# Patient Record
Sex: Male | Born: 1945 | Race: White | Hispanic: No | Marital: Married | Smoking: Former smoker
Health system: Southern US, Community
[De-identification: ages and names within clinical notes are randomized; demographics above are authoritative.]

## PROBLEM LIST (undated history)

## (undated) DIAGNOSIS — E785 Hyperlipidemia, unspecified: Secondary | ICD-10-CM

## (undated) DIAGNOSIS — M1990 Unspecified osteoarthritis, unspecified site: Secondary | ICD-10-CM

## (undated) DIAGNOSIS — I10 Essential (primary) hypertension: Secondary | ICD-10-CM

## (undated) DIAGNOSIS — G629 Polyneuropathy, unspecified: Secondary | ICD-10-CM

## (undated) DIAGNOSIS — D473 Essential (hemorrhagic) thrombocythemia: Secondary | ICD-10-CM

## (undated) HISTORY — DX: Hyperlipidemia, unspecified: E78.5

## (undated) HISTORY — PX: JOINT REPLACEMENT: SHX530

## (undated) HISTORY — PX: TOE AMPUTATION: SHX809

## (undated) HISTORY — DX: Polyneuropathy, unspecified: G62.9

## (undated) HISTORY — PX: CARDIAC CATHETERIZATION: SHX172

## (undated) HISTORY — DX: Essential (hemorrhagic) thrombocythemia: D47.3

## (undated) HISTORY — PX: HERNIA REPAIR: SHX51

---

## 1998-06-27 ENCOUNTER — Ambulatory Visit (HOSPITAL_COMMUNITY): Admission: RE | Admit: 1998-06-27 | Discharge: 1998-06-27 | Payer: Self-pay | Admitting: Cardiology

## 1999-06-04 ENCOUNTER — Ambulatory Visit (HOSPITAL_COMMUNITY): Admission: RE | Admit: 1999-06-04 | Discharge: 1999-06-04 | Payer: Self-pay | Admitting: Gastroenterology

## 2001-08-22 ENCOUNTER — Encounter: Admission: RE | Admit: 2001-08-22 | Discharge: 2001-08-22 | Payer: Self-pay | Admitting: Internal Medicine

## 2001-08-22 ENCOUNTER — Encounter: Payer: Self-pay | Admitting: Internal Medicine

## 2004-04-21 ENCOUNTER — Encounter: Admission: RE | Admit: 2004-04-21 | Discharge: 2004-04-21 | Payer: Self-pay | Admitting: Internal Medicine

## 2010-02-24 ENCOUNTER — Emergency Department (HOSPITAL_COMMUNITY): Admission: EM | Admit: 2010-02-24 | Discharge: 2010-02-24 | Payer: Self-pay | Admitting: Emergency Medicine

## 2011-02-03 LAB — GRAM STAIN

## 2011-02-03 LAB — CSF CELL COUNT WITH DIFFERENTIAL
Eosinophils, CSF: 0 % (ref 0–1)
RBC Count, CSF: 1415 /mm3 — ABNORMAL HIGH
Tube #: 1
Tube #: 4

## 2011-02-03 LAB — CBC
HCT: 46 % (ref 39.0–52.0)
Hemoglobin: 16 g/dL (ref 13.0–17.0)
MCV: 96.9 fL (ref 78.0–100.0)
Platelets: 487 10*3/uL — ABNORMAL HIGH (ref 150–400)
RDW: 14.1 % (ref 11.5–15.5)

## 2011-02-03 LAB — BASIC METABOLIC PANEL
BUN: 23 mg/dL (ref 6–23)
CO2: 28 mEq/L (ref 19–32)
Calcium: 8.7 mg/dL (ref 8.4–10.5)
Chloride: 102 mEq/L (ref 96–112)
Creatinine, Ser: 0.87 mg/dL (ref 0.4–1.5)
GFR calc Af Amer: >60 mL/min (ref >60)
GFR calc non Af Amer: >60 mL/min (ref >60)
Glucose, Bld: 120 mg/dL — ABNORMAL HIGH (ref 70–99)
Potassium: 3.2 mEq/L — ABNORMAL LOW (ref 3.5–5.1)
Sodium: 136 mEq/L (ref 135–145)

## 2011-02-03 LAB — POCT CARDIAC MARKERS
Myoglobin, poc: 57.2 ng/mL (ref 12–200)
Troponin i, poc: <0.05 ng/mL (ref 0.00–0.09)

## 2011-02-03 LAB — CSF CULTURE W GRAM STAIN

## 2011-02-03 LAB — DIFFERENTIAL
Basophils Absolute: 0 10*3/uL (ref 0.0–0.1)
Eosinophils Absolute: 0.3 10*3/uL (ref 0.0–0.7)
Eosinophils Relative: 3 % (ref 0–5)
Monocytes Absolute: 1.5 10*3/uL — ABNORMAL HIGH (ref 0.1–1.0)

## 2011-02-03 LAB — PROTEIN AND GLUCOSE, CSF: Glucose, CSF: 67 mg/dL (ref 43–76)

## 2011-02-03 LAB — PROTIME-INR: Prothrombin Time: 13.6 seconds (ref 11.6–15.2)

## 2011-04-01 ENCOUNTER — Other Ambulatory Visit (HOSPITAL_COMMUNITY): Payer: Self-pay | Admitting: Specialist

## 2011-04-01 ENCOUNTER — Ambulatory Visit (HOSPITAL_COMMUNITY)
Admission: RE | Admit: 2011-04-01 | Discharge: 2011-04-01 | Disposition: A | Discharge status: Home / Self Care | Payer: Medicare Other | Source: Ambulatory Visit | Attending: Specialist | Admitting: Specialist

## 2011-04-01 ENCOUNTER — Other Ambulatory Visit: Payer: Self-pay | Admitting: Specialist

## 2011-04-01 ENCOUNTER — Encounter (HOSPITAL_COMMUNITY): Payer: Medicare Other

## 2011-04-01 DIAGNOSIS — I10 Essential (primary) hypertension: Secondary | ICD-10-CM | POA: Insufficient documentation

## 2011-04-01 DIAGNOSIS — Z01818 Encounter for other preprocedural examination: Secondary | ICD-10-CM

## 2011-04-01 DIAGNOSIS — M6289 Other specified disorders of muscle: Secondary | ICD-10-CM | POA: Insufficient documentation

## 2011-04-01 DIAGNOSIS — Z01812 Encounter for preprocedural laboratory examination: Secondary | ICD-10-CM | POA: Insufficient documentation

## 2011-04-01 DIAGNOSIS — Z01811 Encounter for preprocedural respiratory examination: Secondary | ICD-10-CM | POA: Insufficient documentation

## 2011-04-01 DIAGNOSIS — I498 Other specified cardiac arrhythmias: Secondary | ICD-10-CM | POA: Insufficient documentation

## 2011-04-01 DIAGNOSIS — Z01810 Encounter for preprocedural cardiovascular examination: Secondary | ICD-10-CM | POA: Insufficient documentation

## 2011-04-01 DIAGNOSIS — M1710 Unilateral primary osteoarthritis, unspecified knee: Secondary | ICD-10-CM | POA: Insufficient documentation

## 2011-04-01 LAB — COMPREHENSIVE METABOLIC PANEL
AST: 45 U/L — ABNORMAL HIGH (ref 0–37)
Albumin: 4 g/dL (ref 3.5–5.2)
Alkaline Phosphatase: 76 U/L (ref 39–117)
BUN: 15 mg/dL (ref 6–23)
GFR calc Af Amer: >60 mL/min (ref >60)
Potassium: 3.8 mEq/L (ref 3.5–5.1)
Total Protein: 7.8 g/dL (ref 6.0–8.3)

## 2011-04-01 LAB — APTT: aPTT: 37 seconds (ref 24–37)

## 2011-04-01 LAB — SURGICAL PCR SCREEN
MRSA, PCR: NEGATIVE
Staphylococcus aureus: POSITIVE — AB

## 2011-04-01 LAB — URINALYSIS, ROUTINE W REFLEX MICROSCOPIC
Bilirubin Urine: NEGATIVE
Glucose, UA: NEGATIVE mg/dL
Hgb urine dipstick: NEGATIVE
Protein, ur: NEGATIVE mg/dL
Specific Gravity, Urine: 1.019 (ref 1.005–1.030)

## 2011-04-01 LAB — DIFFERENTIAL
Basophils Absolute: 0 10*3/uL (ref 0.0–0.1)
Eosinophils Relative: 6 % — ABNORMAL HIGH (ref 0–5)
Lymphocytes Relative: 17 % (ref 12–46)
Lymphs Abs: 1.4 10*3/uL (ref 0.7–4.0)
Neutro Abs: 4.8 10*3/uL (ref 1.7–7.7)
Neutrophils Relative %: 61 % (ref 43–77)

## 2011-04-01 LAB — CBC
HCT: 49.2 % (ref 39.0–52.0)
Hemoglobin: 16.8 g/dL (ref 13.0–17.0)
MCV: 92 fL (ref 78.0–100.0)
RBC: 5.35 MIL/uL (ref 4.22–5.81)
RDW: 13.6 % (ref 11.5–15.5)
WBC: 7.9 10*3/uL (ref 4.0–10.5)

## 2011-04-01 LAB — PROTIME-INR: INR: 1.11 (ref 0.00–1.49)

## 2011-04-05 IMAGING — CT CT HEAD W/O CM
1 of 2 series · 13 of 30 positions shown, 17 images · non-contrast
Comparison: None.

CLINICAL DATA: Severe headache, blurred vision, vertigo

CT HEAD WITHOUT CONTRAST
TECHNIQUE: Contiguous axial images were obtained from the base of
the skull through the vertex without contrast.

[Series 2: brain · axial · 0.49mm/px · z∈[+147,+285]mm · 13 of 32 slices shown, 17 images]
[im 3/32  brain]
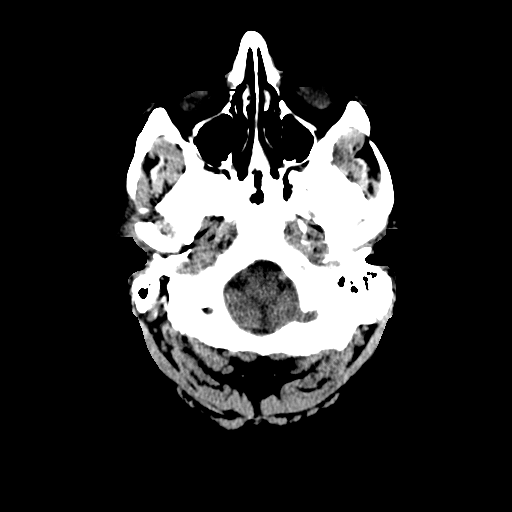
[im 3/32  bone]
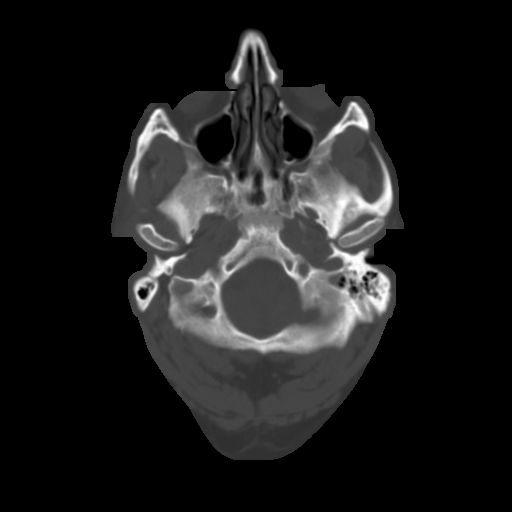
[im 5/32  brain]
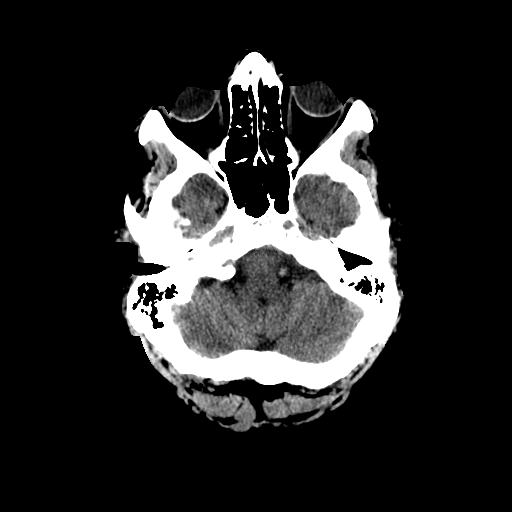
[im 7/32  brain]
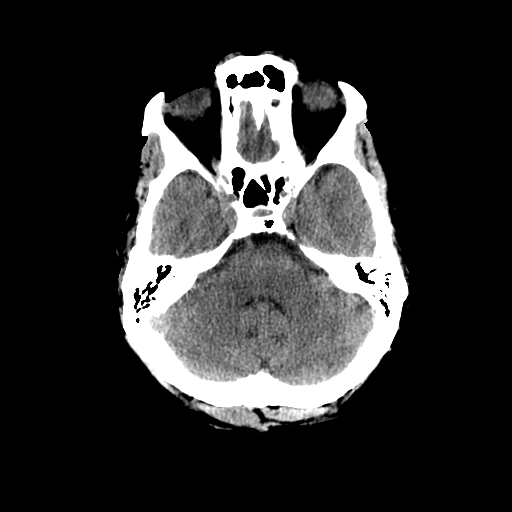
[im 9/32  brain]
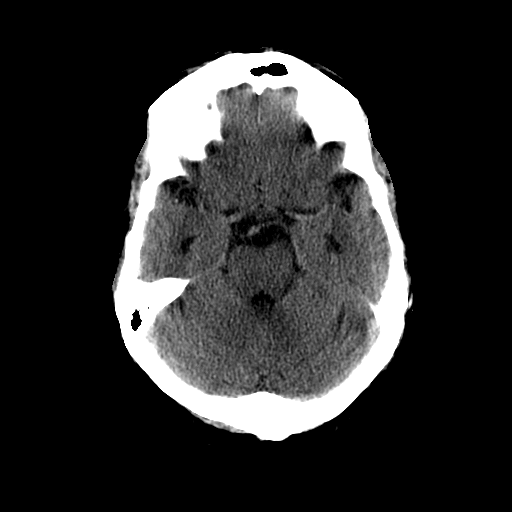
[im 12/32  brain]
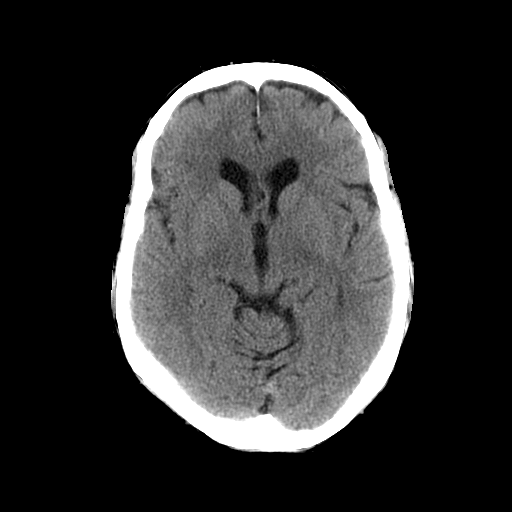
[im 12/32  bone]
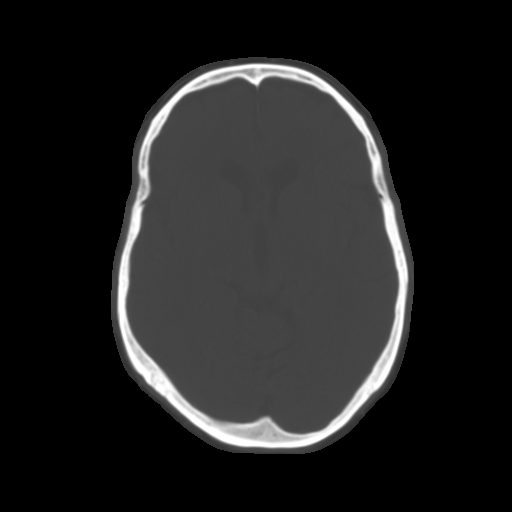
[im 14/32  brain]
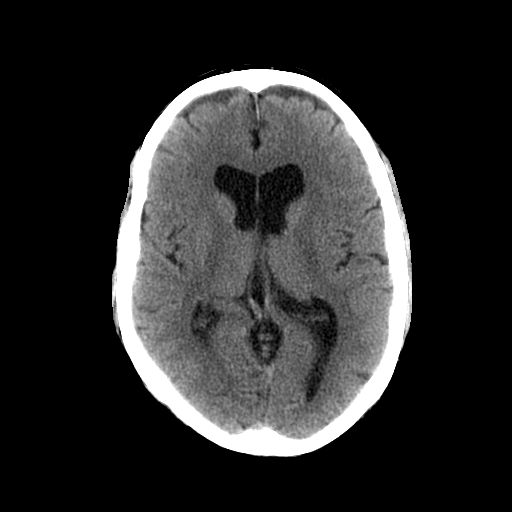
[im 16/32  brain]
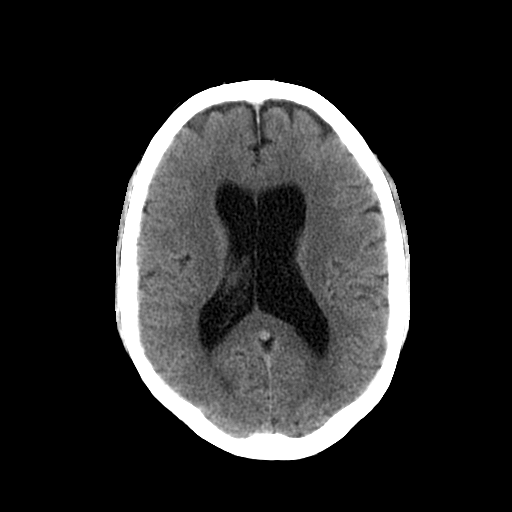
[im 18/32  brain]
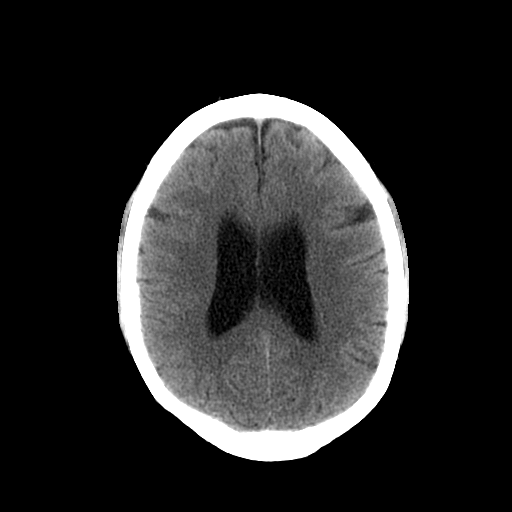
[im 20/32  brain]
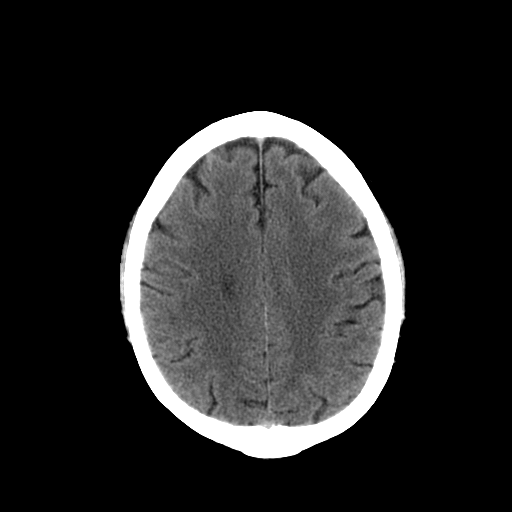
[im 20/32  bone]
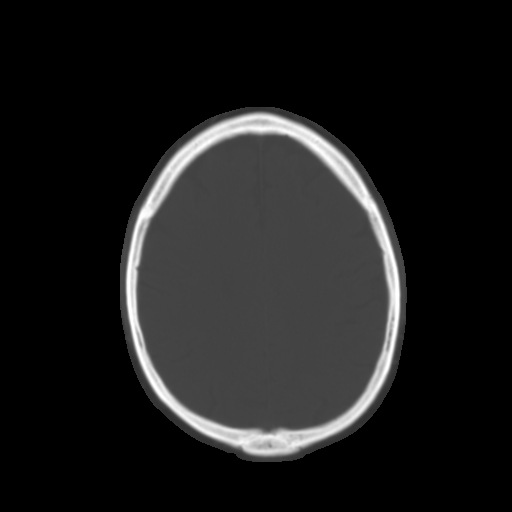
[im 23/32  brain]
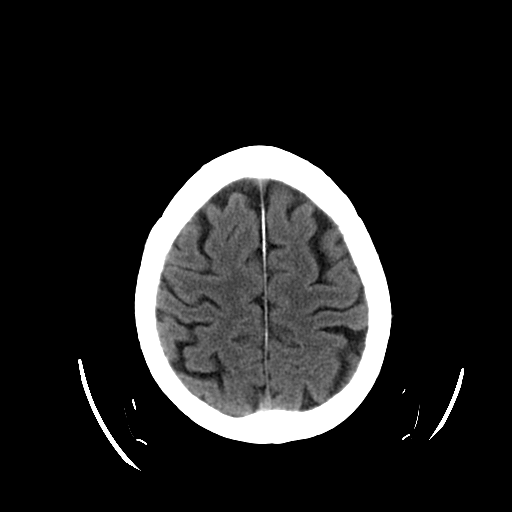
[im 25/32  brain]
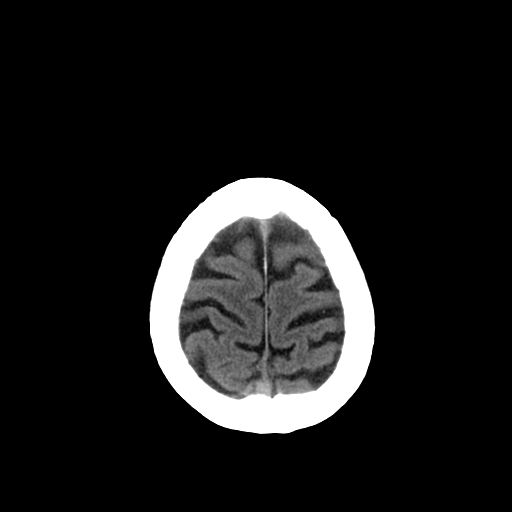
[im 27/32  brain]
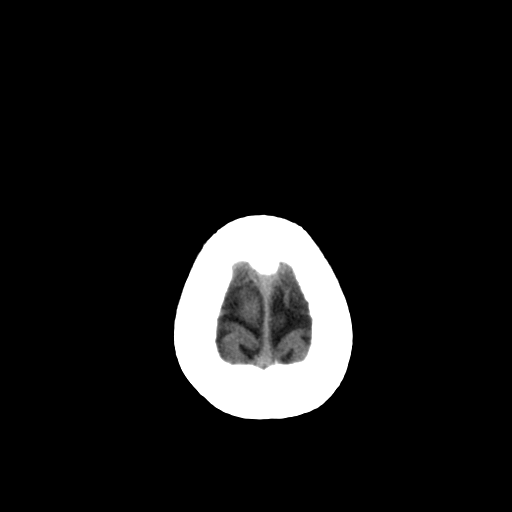
[im 29/32  brain]
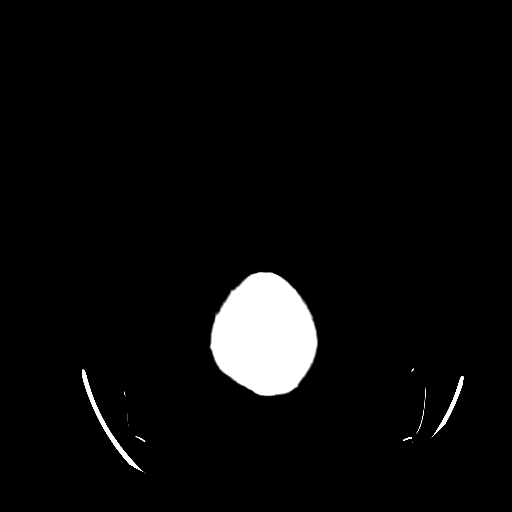
[im 29/32  bone]
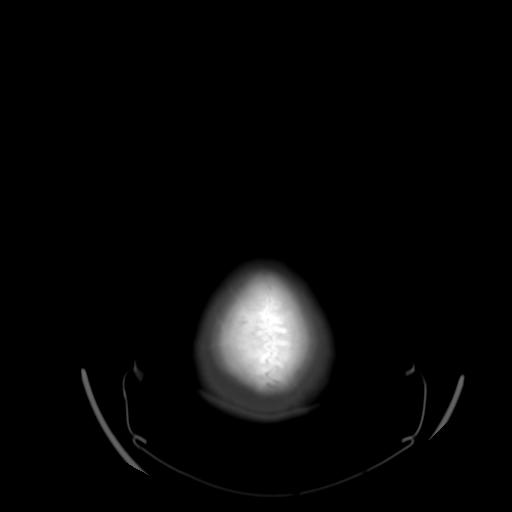

[13 of 30 positions shown; findings below may reference images not displayed]

FINDINGS: No acute hemorrhage, acute infarction, or mass lesion is
identified.  No midline shift.  No ventriculomegaly.  Orbits and
paranasal sinuses are intact.  No skull fracture. Right sphenoid
sinus mucous retention cyst or polyp noted.
IMPRESSION: Normal exam.

## 2011-04-09 ENCOUNTER — Inpatient Hospital Stay (HOSPITAL_COMMUNITY)
Admission: RE | Admit: 2011-04-09 | Discharge: 2011-04-12 | DRG: 470 | Disposition: A | Discharge status: Home Health | Payer: Medicare Other | Source: Ambulatory Visit | Attending: Specialist | Admitting: Specialist

## 2011-04-09 DIAGNOSIS — Z01812 Encounter for preprocedural laboratory examination: Secondary | ICD-10-CM

## 2011-04-09 DIAGNOSIS — E7800 Pure hypercholesterolemia, unspecified: Secondary | ICD-10-CM | POA: Diagnosis present

## 2011-04-09 DIAGNOSIS — K449 Diaphragmatic hernia without obstruction or gangrene: Secondary | ICD-10-CM | POA: Diagnosis present

## 2011-04-09 DIAGNOSIS — I10 Essential (primary) hypertension: Secondary | ICD-10-CM | POA: Diagnosis present

## 2011-04-09 DIAGNOSIS — M1710 Unilateral primary osteoarthritis, unspecified knee: Principal | ICD-10-CM | POA: Diagnosis present

## 2011-04-09 LAB — CROSSMATCH: Antibody Screen: NEGATIVE

## 2011-04-10 LAB — BASIC METABOLIC PANEL
Calcium: 8.4 mg/dL (ref 8.4–10.5)
Creatinine, Ser: 0.79 mg/dL (ref 0.4–1.5)
GFR calc Af Amer: >60 mL/min (ref >60)
GFR calc non Af Amer: >60 mL/min (ref >60)

## 2011-04-10 LAB — CBC
MCH: 30.7 pg (ref 26.0–34.0)
MCHC: 33.2 g/dL (ref 30.0–36.0)
Platelets: 498 10*3/uL — ABNORMAL HIGH (ref 150–400)

## 2011-04-11 LAB — BASIC METABOLIC PANEL
Chloride: 94 mEq/L — ABNORMAL LOW (ref 96–112)
Creatinine, Ser: 0.79 mg/dL (ref 0.4–1.5)
GFR calc Af Amer: >60 mL/min (ref >60)
Potassium: 3.3 mEq/L — ABNORMAL LOW (ref 3.5–5.1)
Sodium: 133 mEq/L — ABNORMAL LOW (ref 135–145)

## 2011-04-11 LAB — CBC
MCH: 31 pg (ref 26.0–34.0)
Platelets: 502 10*3/uL — ABNORMAL HIGH (ref 150–400)
RBC: 4.97 MIL/uL (ref 4.22–5.81)
WBC: 14.5 10*3/uL — ABNORMAL HIGH (ref 4.0–10.5)

## 2011-04-12 LAB — CBC
MCV: 91.6 fL (ref 78.0–100.0)
Platelets: 453 10*3/uL — ABNORMAL HIGH (ref 150–400)
RBC: 4.91 MIL/uL (ref 4.22–5.81)
WBC: 15.5 10*3/uL — ABNORMAL HIGH (ref 4.0–10.5)

## 2011-04-14 NOTE — H&P (Signed)
NAME:  Jordan Vazquez, Jordan Vazquez NO.:  [id number]  MEDICAL RECORD NO.:  [id number]           PATIENT TYPE:  LOCATION:                                FACILITY:  [facility]  PHYSICIAN:  Erasmo Leventhal, M.D.DATE OF BIRTH:  [birth year]  DATE OF ADMISSION:  04/09/2011 DATE OF DISCHARGE:                             HISTORY & PHYSICAL   CHIEF COMPLAINT:  Painful loss of range of motion, left knee.  PRESENT ILLNESS:  The patient is a 65 year old gentleman, well-known to Dr. [name] for evaluation and treatment of his bilateral knees.  The patient has  failed conservative treatment of his knees, and the patient has elected to proceed with the total knee arthroplasty, first on his left knee as it is most symptomatic.  X-rays of both knees show he has bone-on-bone medial compartment with significant varus deformities and moderate patellofemoral changes.  The patient's conservative treatmentincluded viscosupplementation, occasional cortisone injections, oral anti-inflammatories.  He has had left knee scoped by Dr. Fannie Knee in the past.  He has had post physical therapy with that procedure.  ALLERGIES:  NIACIN.  CURRENT MEDICATIONS: 1. Atenolol/hydrochlorothiazide 50/25 mg once a day. 2. Lipitor 40 mg once a day. 3. Hydrocodone p.r.n. 4. Aspirin 81 mg. 5. Vitamin D. 6. MegaRed multivitamins. 7. Calcium. 8. Magnesium. 9. Boron. 10.Vitamin D.  PRIMARY CARE PHYSICIAN:  Dr. [name].  NEUROLOGIST:  Joycelyn Schmid, MD  CARDIOLOGY:  Peter M. Swazil[place], MD  GASTROENTEROLOGIST:  Llana Aliment. Randa Evens, MD  CURRENT MEDICAL HISTORY:  Includes; 1. Hypertension. 2. Hypercholesterolemia. 3. Hiatal hernia. 4. Arthritis.  REVIEW OF SYSTEMS:  NEUROLOGIC:  Negative.  He had 1 episode of lightheadedness and dizziness reviewed by Dr. Marjory Lies, and with no diagnosis of vertigo or TIA.  He has had no recurrence or other neurologic complaints.  PULMONARY:  Unremarkable for any asthma, bronchitis,  COPD, sleep apnea, or tuberculosis.  CARDIOVASCULAR:  He had 1 episode of shortness breath 10-15 years ago.  He had a stress test, questionable blockage, so he had a cath which was unremarkable by Dr. [place].  No recent cardiac problems with shortness breath, chest pain, irregular heart rhythms or further workup.  GI:  He does have a hiatal hernia.  No problems with reflux, ulcers, hepatitis, gallbladder, or colon issues.  He is due for colonoscopy in the next year.  GU:  No problems with prostate, kidney failure, or stones.  ENDOCRINE:  No thyroid or diabetic issues.  HEMATOLOGIC:  No  anemia, blood clots, or cancers.  PREVIOUS SURGERIES:  Include knee scopes, no problems or complications.  FAMILY MEDICAL HISTORY:  Father is deceased.  His mother is also deceased.  SOCIAL HISTORY:  He is married.  Works as a Nutritional therapist.  He has smoked, stopped smoking for the last 45 years.  He will have the occasional beer.  He has 3 grown children.  He lives in a third level apartment with 32 steps into the apartment.  PHYSICAL EXAMINATION:  VITAL SIGNS:  He is 5 feet 10-1/2 inches tall, 224 pounds.  Blood pressure is 128/88, pulse 80, respirations 12 and nonlabored, the patient is  afebrile. GENERAL:  This a healthy-appearing, well-developed gentleman, conscious, alert, appropriate, appears to be a good historian, appears to be in no acute distress. HEENT:  Head was normocephalic.  Gross hearing is intact. NECK:  Supple.  No palpable lymphadenopathy.  Good range of motion. CHEST:  Lung sounds clear throughout. HEART:  Regular rate and rhythm. ABDOMEN:  Bowel sounds present.  Soft, nontender. EXTREMITIES:  Upper extremities have good range of motion and good strength.  Lower extremities, he had good motion of both hips.  Both knees have obvious varus deformities, painful motion.  He has got range of motion from about 5 degrees to about 120.  No ligament instability. Calves are soft, nontender.  No  signs of phlebitis or edema.  Good motion at the ankles. PERIPHERAL VASCULAR:  Carotid pulses were 2+, no bruits.  Radial pulses were 2+, posterior tibial pulse 1+. NEURO:  The patient is conscious, alert, and appropriate.  No gross neurologic defects. BREASTS:  Deferred. RECTAL:  Deferred. GENITOURINARY:  Deferred.  IMPRESSION: 1. End-stage osteoarthritis, bilateral knees, with significant varus     deformities; bone-on-bone medial compartment, left more symptomatic     than right. 2. Hypertension. 3. Hypercholesterolemia. 4. Hiatal hernia.  PLAN:  The patient has been medically cleared by Dr. [name] for this upcoming surgical procedure.  The patient will undergo routine labs and tests prior to having a left total knee arthroplasty by Dr. [name] at [facility] on Apr 09, 2011.     [name], P.A.   ______________________________ Erasmo Leventhal, M.D.    RWK/MEDQ  D:  03/29/2011  T:  03/29/2011  Job:  [id number]  cc:   Erasmo Leventhal, M.D. Fax: [phone]  Electronically Signed by Arlyn Leak P.A. on 04/02/2011 07:40:38 AM Electronically Signed by Eugenia Mcalpine M.D. on 04/14/2011 01:03:53 PM

## 2011-04-14 NOTE — Op Note (Signed)
NAME:  Jordan Vazquez, Jordan Vazquez                  ACCOUNT NO.:  [id number]  MEDICAL RECORD NO.:  [id number]           PATIENT TYPE:  I  LOCATION:  1612                         FACILITY:  [facility]  PHYSICIAN:  Erasmo Leventhal, M.D.DATE OF BIRTH:  03/04/1946  DATE OF PROCEDURE:  04/09/2011 DATE OF DISCHARGE:                              OPERATIVE REPORT   PREOPERATIVE DIAGNOSIS:  Left knee end-stage osteoarthritis.  POSTOPERATIVE DIAGNOSIS:  Left knee end-stage osteoarthritis.  PROCEDURE:  Left total knee arthroplasty.  SURGEON:  Erasmo Leventhal, MD  ASSISTANT:  [name], P.A-C  ANESTHESIA:  Spinal with monitored anesthesia care.  ESTIMATED BLOOD LOSS:  Less than 50 cc.  DRAINS:  Wound Hemovac.  COMPLICATIONS:  None.  TOURNIQUET TIME:  95 minutes at 250 mmHg.  COMPLICATIONS:  None.  DISPOSITION:  PACU stable.  OPERATIVE IMPLANTS:  Laural Benes and [employer] Sigma size 6 femur, size 5 tibia, 15 mm posterior-stabilized rotating platform tibial insert.  A 38-mm all-polyethylene patella.  All cemented.  OPERATIVE DETAILS:  The patient counseled in the holding area, the correct site was identified.  Chart signed appropriately and taken to the operating room placed on the table in supine position.  Spinal anesthetic was administered.  IV antibiotics were given.  Foley catheter placed on sterile technique by OR circulating nurse.  Operative extremity  was well padded and bumped, prepped with DuraPrep, and draped in sterile fashion.  Esmarch and tourniquet was inflated to 250 mmHg. Time-out was done appropriately and all confirmed.  Straight midline incision was made through skin and subcutaneous tissue. Medial soft tissue flaps were developed to appropriate level.  Taking very care to previous arthrotomy __________ in the medial side and cautious with that area.  Medial parapatellar arthrotomy was performed. Using marked varus and block plus medial soft tissue release was  done __________ ligaments were protected.  Knee was then flexed with end- stage arthritis changes __________ spinal and IV type bone.  ACL was absent.  PCL was resected and the ganglion cyst was present removed from the intracondylar notch.  Distal femur was found to be __________ .  Femoral canal was irrigated the effluent was clear.  __________ rod was placed and took a 11-mm cut off distal femur and 5 degrees valgus cut.  Distal femur was  found be a size 6, rotation __________size 6, medial menisci removed under direct visualization.  Geniculate vessels coagulated.  Posterior neurovascular structures were protected.  Tibia was subluxed anteriorly, osteophytes were removed from the medial  side.  Extramedullary rod was utilized on the tibia.  We ended up taking a 10 mm cut off the least deficient side on the lateral side at a 0 degree slope.  Posteromedial posterofemoral osteophytes were removed under direct visualization.  At this point time flexion/extension blocks for tendons were well-balanced.  Collateral ligaments  were intact and stable.  Knee was flexed.  Tibia subluxed anteriorly, found be a size 5 rotation __________ reamer punch performed.  Femoral box was cut at this time a size 6 femur, size 5 tibia with a 12.5 trial insert.  We had excellent  range of motion, soft tissue balance and alignment.  Patella was found be a size 38 .  Appropriate amount of  bone resected.  __________ patella cut tracked anatomically.  All trials were removed.  Knee was irrigated pulsatile lavage utilizing __________  technique.  Allis marks were cemented in place, size 6 femur, size 5 tibia, 38 patella.  The cement had cured, excess cement was removed.  We did a 12.5 and 50 mm trial insert, the 12.5 insert we had flexion and extension gaps well balanced, knee was in full extension.  Patella tracked anatomically. Trial was removed. Excess cement was removed, knee was again irrigated. Gelfoam  was placed posteriorly __________ tibial insert was inserted. We now have well-balanced, well-aligned knee tracking anatomically.  Medium Hemovac drains  were placed.  The knee was closed with 90 degrees flexion with Vicryl suture in figure-of-eight fashion.  Subcu Vicryl, skin with subcu Monocryl suture.  Steri-Strips applied to the knee, drain hooked to suction.  Sterile dressing applied.  Tourniquet Deflated.  Normal circulation at the end of the case.  The patient was in no complications or problems.  He will be awakened and taken to PACU in stable condition.  To help with surgery technique and decision making, Maryln Manuel, [place] assisted in the entire case.          ______________________________ Erasmo Leventhal, M.D.     RAC/MEDQ  D:  04/09/2011  T:  04/10/2011  Job:  [id number]  Electronically Signed by Eugenia Mcalpine M.D. on 04/14/2011 01:03:56 PM

## 2011-04-20 ENCOUNTER — Ambulatory Visit: Payer: Medicare Other | Admitting: Physical Therapy

## 2011-04-23 NOTE — Discharge Summary (Addendum)
NAME:  Jordan Vazquez, Jordan Vazquez NO.:  [id number]  MEDICAL RECORD NO.:  [id number]  LOCATION:  1612                         FACILITY:  [facility]  PHYSICIAN:  Jordan Vazquez, M.D.DATE OF BIRTH:  [birth year]  DATE OF ADMISSION:  04/09/2011 DATE OF DISCHARGE:  04/12/2011                              DISCHARGE SUMMARY   ADMISSION DIAGNOSES: 1. End-stage osteoarthritis of bilateral knees with significant valgus     deformity bone-on-bone medial compartment, left more symptomatic     than right. 2. Hypertension. 3. Hypercholesterolemia. 4. Hiatal hernia.  DISCHARGE DIAGNOSIS: 1. Left total knee arthroplasty without complications, routine     postoperative course. 2. Hypertension. 3. Hypercholesterolemia. 4. History of hiatal hernia.  HISTORY OF PRESENT ILLNESS:  The patient is a 65 year old gentleman well- known to Dr. Thomasena Vazquez for invasive treatment of bilateral knee pain.  He has failed conservative treatment to include injections, physical therapy, viscous supplements, oral anti-inflammatories.  The patient has been arthroscoped in the past.  The patient elected to proceed with a total knee arthroplasty.  MEDICATIONS ON ADMISSION:  ALLERGIES:  He is allergic to NIACIN  CURRENT MEDICATIONS: 1. Atenolol/hydrochlorothiazide 50/25 2. Lipitor 40 mg a day. 3. Hydrocodone 4. Aspirin 81 mg 5. Vitamin D. 6. MegaRed multivitamins. 7. Calcium 8. Magnesium. 9. Boron. 10.Vitamin D.  PRIMARY CARE PHYSICIAN:  Dr. Valentina Vazquez  SURGICAL PROCEDURES:  On Apr 09, 2011, the patient was taken to the OR by Dr. Valma Cava, assistant Jordan Leak PA-C, with monitored anesthesia care.  The patient underwent a total knee arthroplasty on the left with a DePuy rotating platform system.  Tourniquet was used. Minimum blood loss incurred.  One Hemovac drain left in place. Following components were implanted:  Size 6 femur, size 5 tibia, 15-mm thickness polyethylene insert, a 38-mm  polyethylene patella.  All components were implanted with polymethyl methacrylate.  The patient was transferred to recovery room and to the orthopedic floor in good condition.  CONSULTS:  Following routine consults were requested:  Physical Therapy, Case Management, Pharmacy.  HOSPITAL COURSE:  On Apr 09, 2011, the patient was admitted to [facility] under the care of Dr. Valma Cava.  The patient was taken to the OR where a left total knee arthroplasty was performed without any complications.  The patient was transferred to the recovery room, then to the orthopedic floor in good condition followed by total knee protocol.  The patient was transferred to the floor on IV antibiotics, pain medicines, and Lovenox for DVT prophylaxis.  The patient had his Hemovac DC'ed on postop day #1 without any complications.  He was able to wean off IV medications by postop day #2. The patient participated daily with Physical Therapy without any complications or issues.  The patient's wound remained benign for any signs of infection.  His leg remained neuromotor vascularly intact.  On postop day #3, his white count was 15.5 with a hemoglobin 15, hematocrit 45.0, platelets were 453.  Postop day #2, his chemistries found sodium of 133.  He was asymptomatic.  His potassium is 3.3, glucose 104, BUN and creatinine 0.79.  The patient was not placed on any p.o. supplements by Ortho  coverage over that weekend.  It was felt on postop day #3 the patient was medically and orthopedically stable and ready for discharge to home.  So arrangements were made for his discharge with outpatient home health physical therapy per protocol with routine followup.  The patient was discharged in good condition.  LABORATORY DATA:  CBC on discharge found WBC 5.5, hemoglobin 15.0, hematocrit 45, platelets 452.  Routine chemistries on discharge found sodium 133, potassium of 3.3, glucose 104, BUN 8, creatinine  0.79.  DISCHARGE INSTRUCTIONS:  ACTIVITY:  The patient is to be up daily and ambulating with the use of a walker or crutches.  He can be weightbearing as tolerated.  He is to his long leg immobilizer until he is able to straight leg without difficulty.  DIET:  No restrictions.  WOUND CARE:  The patient is to keep the wound clean and dry.  If he takes a shower, he is to cover with dressing with a nonabsorbable wrap.  FOLLOWUP:  He needs a 2-week followup appointment with Dr. Thomasena Vazquez.  The patient is to call [phone] for that followup appointment.  MEDICATIONS: 1. Atenolol/hydrochlorothiazide 50/25 mg once a day. 2. Lipitor 40 mg once a day. 3. Aspirin 81 mg a day. 4. Vitamin D once a day. 5. MegaRed multivitamins once a day. 6. Calcium once a day. 7. Magnesium once a day. 8. Boron once a day. 9. Vitamin D once a day. 10.Lovenox 30 mg subcu injection q.12 h for total of 14 days. 11.Robaxin 500 mg 1 tablet every 6 hours p.r.n. muscle spasms if     needed. 12.Percocet 1 or 2 tablets every 4 to 6 hours for any pain.  CONDITION ON DISCHARGE:  The patient's condition upon discharge to home is listed improved and good.     Jordan Vazquez, P.A.   ______________________________ Jordan Vazquez, M.D.    RWK/MEDQ  D:  04/21/2011  T:  04/21/2011  Job:  [id number]  cc:   Jordan Vazquez, M.D. Fax: [phone]  Dr. Valentina Vazquez  Electronically Signed by Jordan Vazquez P.A. on 04/23/2011 08:31:24 AM Electronically Signed by Jordan Vazquez M.D. on 06/02/2011 04:49:47 PM

## 2011-04-26 ENCOUNTER — Ambulatory Visit: Payer: Medicare Other | Attending: Specialist | Admitting: Rehabilitative and Restorative Service Providers"

## 2011-04-26 DIAGNOSIS — M25569 Pain in unspecified knee: Secondary | ICD-10-CM | POA: Insufficient documentation

## 2011-04-26 DIAGNOSIS — Z96659 Presence of unspecified artificial knee joint: Secondary | ICD-10-CM | POA: Insufficient documentation

## 2011-04-26 DIAGNOSIS — M25669 Stiffness of unspecified knee, not elsewhere classified: Secondary | ICD-10-CM | POA: Insufficient documentation

## 2011-04-26 DIAGNOSIS — IMO0001 Reserved for inherently not codable concepts without codable children: Secondary | ICD-10-CM | POA: Insufficient documentation

## 2011-04-29 ENCOUNTER — Ambulatory Visit: Payer: Medicare Other | Admitting: Rehabilitative and Restorative Service Providers"

## 2011-05-05 ENCOUNTER — Ambulatory Visit: Payer: Medicare Other | Admitting: Physical Therapy

## 2011-05-06 ENCOUNTER — Ambulatory Visit: Payer: Medicare Other | Admitting: Physical Therapy

## 2011-05-10 ENCOUNTER — Ambulatory Visit: Payer: Medicare Other | Admitting: Physical Therapy

## 2011-05-12 ENCOUNTER — Ambulatory Visit: Payer: Medicare Other | Admitting: Physical Therapy

## 2011-05-18 ENCOUNTER — Ambulatory Visit: Payer: Medicare Other | Attending: Specialist | Admitting: Physical Therapy

## 2011-05-18 DIAGNOSIS — IMO0001 Reserved for inherently not codable concepts without codable children: Secondary | ICD-10-CM | POA: Insufficient documentation

## 2011-05-18 DIAGNOSIS — M25669 Stiffness of unspecified knee, not elsewhere classified: Secondary | ICD-10-CM | POA: Insufficient documentation

## 2011-05-18 DIAGNOSIS — Z96659 Presence of unspecified artificial knee joint: Secondary | ICD-10-CM | POA: Insufficient documentation

## 2011-05-18 DIAGNOSIS — M25569 Pain in unspecified knee: Secondary | ICD-10-CM | POA: Insufficient documentation

## 2011-05-25 ENCOUNTER — Ambulatory Visit: Payer: Medicare Other | Admitting: Rehabilitative and Restorative Service Providers"

## 2011-05-27 ENCOUNTER — Ambulatory Visit: Payer: Medicare Other | Admitting: Rehabilitative and Restorative Service Providers"

## 2011-05-31 ENCOUNTER — Ambulatory Visit: Payer: Medicare Other | Admitting: Rehabilitative and Restorative Service Providers"

## 2011-06-02 ENCOUNTER — Ambulatory Visit: Payer: Medicare Other | Admitting: Rehabilitative and Restorative Service Providers"

## 2011-06-08 ENCOUNTER — Encounter: Payer: Medicare Other | Admitting: Rehabilitative and Restorative Service Providers"

## 2011-06-10 ENCOUNTER — Encounter: Payer: Medicare Other | Admitting: Rehabilitative and Restorative Service Providers"

## 2011-09-09 ENCOUNTER — Other Ambulatory Visit: Payer: Self-pay | Admitting: Specialist

## 2011-09-09 ENCOUNTER — Encounter (HOSPITAL_COMMUNITY): Payer: Medicare Other

## 2011-09-09 LAB — DIFFERENTIAL
Eosinophils Relative: 5 % (ref 0–5)
Lymphocytes Relative: 14 % (ref 12–46)
Lymphs Abs: 1.7 10*3/uL (ref 0.7–4.0)

## 2011-09-09 LAB — COMPREHENSIVE METABOLIC PANEL
ALT: 43 U/L (ref 0–53)
Alkaline Phosphatase: 92 U/L (ref 39–117)
CO2: 31 mEq/L (ref 19–32)
Chloride: 96 mEq/L (ref 96–112)
GFR calc Af Amer: >90 mL/min (ref >90)
GFR calc non Af Amer: >90 mL/min (ref >90)
Glucose, Bld: 91 mg/dL (ref 70–99)
Potassium: 4.2 mEq/L (ref 3.5–5.1)
Sodium: 137 mEq/L (ref 135–145)
Total Protein: 7.7 g/dL (ref 6.0–8.3)

## 2011-09-09 LAB — URINALYSIS, ROUTINE W REFLEX MICROSCOPIC
Bilirubin Urine: NEGATIVE
Glucose, UA: NEGATIVE mg/dL
Hgb urine dipstick: NEGATIVE
Specific Gravity, Urine: 1.023 (ref 1.005–1.030)
pH: 5.5 (ref 5.0–8.0)

## 2011-09-09 LAB — CBC
HCT: 51.5 % (ref 39.0–52.0)
MCV: 91.8 fL (ref 78.0–100.0)
RBC: 5.61 MIL/uL (ref 4.22–5.81)
RDW: 13.8 % (ref 11.5–15.5)
WBC: 12.3 10*3/uL — ABNORMAL HIGH (ref 4.0–10.5)

## 2011-09-09 LAB — PROTIME-INR: INR: 1.1 (ref 0.00–1.49)

## 2011-09-14 ENCOUNTER — Inpatient Hospital Stay (HOSPITAL_COMMUNITY)
Admission: RE | Admit: 2011-09-14 | Discharge: 2011-09-17 | DRG: 470 | Disposition: A | Discharge status: Home Health | Payer: Medicare Other | Source: Ambulatory Visit | Attending: Specialist | Admitting: Specialist

## 2011-09-14 ENCOUNTER — Inpatient Hospital Stay (HOSPITAL_COMMUNITY): Payer: Medicare Other

## 2011-09-14 DIAGNOSIS — Z96659 Presence of unspecified artificial knee joint: Secondary | ICD-10-CM

## 2011-09-14 DIAGNOSIS — M1710 Unilateral primary osteoarthritis, unspecified knee: Principal | ICD-10-CM | POA: Diagnosis present

## 2011-09-14 DIAGNOSIS — I10 Essential (primary) hypertension: Secondary | ICD-10-CM | POA: Diagnosis present

## 2011-09-14 DIAGNOSIS — N390 Urinary tract infection, site not specified: Secondary | ICD-10-CM | POA: Diagnosis not present

## 2011-09-14 DIAGNOSIS — J3489 Other specified disorders of nose and nasal sinuses: Secondary | ICD-10-CM | POA: Diagnosis not present

## 2011-09-14 DIAGNOSIS — E7800 Pure hypercholesterolemia, unspecified: Secondary | ICD-10-CM | POA: Diagnosis present

## 2011-09-14 DIAGNOSIS — K449 Diaphragmatic hernia without obstruction or gangrene: Secondary | ICD-10-CM | POA: Diagnosis present

## 2011-09-14 DIAGNOSIS — E871 Hypo-osmolality and hyponatremia: Secondary | ICD-10-CM | POA: Diagnosis not present

## 2011-09-14 DIAGNOSIS — Z01812 Encounter for preprocedural laboratory examination: Secondary | ICD-10-CM

## 2011-09-14 DIAGNOSIS — E876 Hypokalemia: Secondary | ICD-10-CM | POA: Diagnosis not present

## 2011-09-14 DIAGNOSIS — M21169 Varus deformity, not elsewhere classified, unspecified knee: Secondary | ICD-10-CM | POA: Diagnosis present

## 2011-09-14 DIAGNOSIS — Z7982 Long term (current) use of aspirin: Secondary | ICD-10-CM

## 2011-09-14 DIAGNOSIS — D72829 Elevated white blood cell count, unspecified: Secondary | ICD-10-CM | POA: Diagnosis not present

## 2011-09-14 DIAGNOSIS — Z79899 Other long term (current) drug therapy: Secondary | ICD-10-CM

## 2011-09-14 LAB — CROSSMATCH: Antibody Screen: NEGATIVE

## 2011-09-15 LAB — BASIC METABOLIC PANEL
BUN: 14 mg/dL (ref 6–23)
Creatinine, Ser: 0.87 mg/dL (ref 0.50–1.35)
GFR calc non Af Amer: 89 mL/min — ABNORMAL LOW (ref >90)
Glucose, Bld: 137 mg/dL — ABNORMAL HIGH (ref 70–99)
Potassium: 4.7 mEq/L (ref 3.5–5.1)

## 2011-09-15 LAB — CBC
Hemoglobin: 15.4 g/dL (ref 13.0–17.0)
MCH: 29.6 pg (ref 26.0–34.0)
RBC: 5.21 MIL/uL (ref 4.22–5.81)

## 2011-09-16 LAB — CBC
Hemoglobin: 15.2 g/dL (ref 13.0–17.0)
MCH: 29.9 pg (ref 26.0–34.0)
MCHC: 33.5 g/dL (ref 30.0–36.0)
RDW: 13.7 % (ref 11.5–15.5)

## 2011-09-16 LAB — BASIC METABOLIC PANEL
BUN: 10 mg/dL (ref 6–23)
Calcium: 8.9 mg/dL (ref 8.4–10.5)
GFR calc Af Amer: >90 mL/min (ref >90)
GFR calc non Af Amer: 89 mL/min — ABNORMAL LOW (ref >90)
Glucose, Bld: 123 mg/dL — ABNORMAL HIGH (ref 70–99)
Sodium: 130 mEq/L — ABNORMAL LOW (ref 135–145)

## 2011-09-17 LAB — CBC
MCH: 30.1 pg (ref 26.0–34.0)
MCHC: 33.7 g/dL (ref 30.0–36.0)
Platelets: 481 10*3/uL — ABNORMAL HIGH (ref 150–400)
RBC: 5.22 MIL/uL (ref 4.22–5.81)

## 2011-09-17 LAB — URINALYSIS, ROUTINE W REFLEX MICROSCOPIC
Glucose, UA: NEGATIVE mg/dL
Specific Gravity, Urine: 1.022 (ref 1.005–1.030)

## 2011-09-17 LAB — URINE MICROSCOPIC-ADD ON

## 2011-09-17 NOTE — Discharge Summary (Signed)
NAME:  Jordan Vazquez, Jordan Vazquez NO.:  [id number]  MEDICAL RECORD NO.:  [id number]  LOCATION:  1621                         FACILITY:  [facility]  PHYSICIAN:  Erasmo Leventhal, M.D.DATE OF BIRTH:  03-13-1946  DATE OF ADMISSION:  09/14/2011 DATE OF DISCHARGE:  09/17/2011                              DISCHARGE SUMMARY   ADMISSION DIAGNOSES: 1. End-stage osteoarthritis, right knee with varus deformity, bone-on-     bone medial compartment. 2. Recent left total knee arthroplasty without complications. 3. History of hypertension. 4. History of hypercholesterolemia. 5. History of hiatal hernia.  DISCHARGE DIAGNOSES: 1. Right total knee arthroplasty without complications. 2. Mild postoperative hypokalemia, allowed to correct with p.o.     supplementation. 3. Mild hyponatremia, allowed to correct with holding his diuretic and     decreasing and stopping IV fluids. 4. Increased white count without source of infection, probable postop     stress, similar numbers and reactions with his previous total knee     arthroplasty without any signs or source of infection. 5. History of hypertension. 6. History of hypercholesterolemia. 7. History of hiatal hernia. 8. Post Op UTI  HISTORY OF PRESENT ILLNESS:  Patient is a 65 year old gentleman well- known to Dr. [name] for evaluation of bilateral knee pains.  Patient recently went through a total knee arthroplasty on the left  last June without any issues.  Patient had done real well.  He has end-stage osteoarthritis of the right knee, bone-on-bone medial compartment with varus deformity.  Patient has failed conservative treatments with his right knee to include viscosupplements, occasional cortisone injections, physical therapy, anti-inflammatories, so he would like to proceed with a total knee arthroplasty on the left being cleared by his medical doctor.  PRIMARY CARE PHYSICIAN:  Dr. [name].  CARDIOLOGIST:  Dr.  Swaziland.  MEDICATIONS ON ADMISSION:  Atenolol/chlorthalidone, Lipitor, aspirin, vitamin D3, krill oil, multivitamins, calcium and magnesium, occasional Robaxin and hydrocodone.  SURGICAL PROCEDURE:  On September 14, 2011, patient was taken to the OR by Dr. Valma Cava assisted by Oneida Alar, P.A.C.  Under general anesthesia, the patient underwent a right total knee arthroplasty without any complications with the DePuy Sigma System.  Patient had tourniquet used, minimal blood loss, one Hemovac drain was left in place, 1 hour 30 minutes on the tourniquet at 02:50.  Patient was returned to the recovery room in good condition without any complications.  Patient had the following components implanted; size 6 femur, size 6 tibia, a 12.5 thickness rotating platform insert, a 38-mm 3 peg patella.  All components were implanted with polymethyl methacrylate.  CONSULTS:  Following routine consult requested physical therapy, case management.  HOSPITAL COURSE:  On September 14, 2011, patient was admitted to [facility] under the care of Dr. Valma Cava, taken to the OR for a right total knee arthroplasty, which was performed without any complications.  Patient was transferred to recovery room, then to the orthopedic floor in good condition, followed with total knee protocol with IV antibiotics, pain medicines, Lovenox for DVT prophylaxis, physical therapy and a CPM.  Patient incurred 3 days postoperative course, which the patient did very well throughout his hospitalization. Patient's first  postop day #1, drain was discontinued and intact without any complications.  His leg was neuromotor and vascularly intact.  The patient was comfortable on IV and p.o. medications.  He was able to transition to p.o. medications and weaning off IV medications totally. He was able to participate with CPM and physical therapy.  Postop day #2, the patient had a few sniffles, but otherwise no  other complaints of shortness of breath, productive cough.  His pain was well- controlled with his p.o. medicines.  His dressing was changed.  His wound was benign for any signs of infection.  Wound was well- approximated with Steri-Strips.  He was a little sore in the thigh where he had the tourniquet, but his calf was soft and nontender.  His leg was neuromotor vascularly intact and he continued to work well with physical therapy and he was walking several times a day, about 50 feet and he was working well with the CPM.  Postop day #3, patient was awake, alert, without any complaints at all. The sniffles are improved.  He denied any shortness of breath or cough. He had a little bit of a temperature last night to 101.7, but it broke on its own.  He denies any pain on urination.  He has not had a bowel movement, he says, but on a normal basis, it may go a week without a bowel movement and he does not feel like he is backed up or any abdominal discomfort.  Examination of his wound shows his wound is benign for any signs of infection as well as blisters.  He is still a little sore on the thighs.  Calf is soft, nontender.  His leg is neuromotor vascularly intact.  His labs on postop day #3 showed his hemoglobin was 15.7, his white count did slowly increase to 15.2, but he was afebrile, no complaints, no source of infection and checking his previous records, this was a similar reaction he had last surgical procedure.  We will check a UA. We will have him work with physical therapy.  If the UA is unremarkable, no further treatment.  We will go ahead and have him discharge to home with the home health physical therapy and a CPM and routine followup. With any other medical issues, follow up with his medical doctor.  LABS:  CBC on discharge found WBCs 15.2, up from 12.3 on admission with no source of infection.  His hemoglobin is 15.7, hematocrit 46.6 with platelets of 481.  He was 665  on  his preop lab work.  Routine chemistries found sodium of 130, potassium was 3.3, BUN 10, creatinine 0.86 with glucose 123.  He had his IV fluids discontinued, his chlorthalidone was discontinued to allow his sodium to slowly self- correct.  His potassium was corrected with p.o. supplementation 20 mEq p.o. t.i.d. on postop day #2 and 3.  His chest x-ray pre-admission showed no active cardiopulmonary disease.  His EKG on Apr 01, 2011 was sinus bradycardia, otherwise normal. UA on POD #3 positive for many Bacteria, will treat with Bactrim DS.  DISCHARGE INSTRUCTIONS: 1. Activity:  Patient is to increase his activity slowly with the use     of the walker and instructions with physical therapy.  He will be     weightbearing as tolerated. 2. Diet:  No restrictions. 3. Wound care:  He is to change his dressing on a daily basis.  He is     to keep the wound dry for the 2 weeks  until we see him in the     office.  He is report any signs of infection. 4. Followup:  He has a followup appointment in 2 weeks with Dr.     [name] for a followup evaluation.  He is to call [phone] for that     followup appointment.  He is to follow up with Dr. [name] for any     medical issues.  Home health physical therapy through [place].  MEDICATIONS: 1. Calcium and magnesium with 1 mg Boron and vitamin D one tablet     every other day. 2. Multivitamin chewable gum use 2 tablets a day. 3. Krill oil OTC one capsule a day. 4. Vitamin D3 one capsule a day. 5. Robaxin 500 mg one every 6 hours for muscle spasms if needed. 6. Percocet 5/325, 1 or 2 tablets every 4-6 hours for pain if needed. 7. Lovenox 30 mg subcu injection every 12 hours for a total of 14     days, then he can resume his aspirin. 8. Lipitor 80 mg tablet one-half tablet every morning. 9. Atenolol/chlorthalidone 50/25, one tablet every morning.  I am     going to ask the patient to hold that for one more day before he     resumes his normal  dosing. 10. Bactrim DS 1 Tab BID for 5 days.  CONDITION UPON DISCHARGE:  To home is listed improved and good.     [name], P.A.   ______________________________ Erasmo Leventhal, M.D.    RWK/MEDQ  D:  09/17/2011  T:  09/17/2011  Job:  [id number]  cc:   Erasmo Leventhal, M.D. Fax: [phone]  Peter M. Swaziland, M.D. Fax: [phone]  Dr. [name]  Electronically Signed by Arlyn Leak P.A. on 09/17/2011 10:57:49 AM Electronically Signed by Eugenia Mcalpine M.D. on 09/17/2011 11:49:21 AM

## 2011-09-17 NOTE — H&P (Signed)
NAME:  Jordan Vazquez, Jordan Vazquez NO.:  [id number]  MEDICAL RECORD NO.:  [id number]  LOCATION:                               FACILITY:  [facility]  PHYSICIAN:  Erasmo Leventhal, M.D.DATE OF BIRTH:  07-07-1946  DATE OF ADMISSION:  09/14/2011 DATE OF DISCHARGE:                             HISTORY & PHYSICAL   CHIEF COMPLAINT:  Painful loss of range of motion, right knee.  HISTORY OF PRESENT ILLNESS:  Patient is a 65 year old gentleman, well- known to Dr. [name] for evaluation of bilateral knees.  Patient has failed conservative treatment with his knees.  He has elected proceed previously with a total knee arthroplasty in the left, he has done very well.  He continues to be significantly symptomatic with his right knee. He is bone-on-bone medial compartment with varus deformity.  The patient is elected to proceed with a total knee arthroplasty on the right.  He has failed conservative treatments of viscosupplements, occasional cortisone injections, anti-inflammatories, and physical therapy.  ALLERGIES:  NIACIN.  CURRENT MEDICATIONS: 1. Atenolol/hydrochlorothiazide 50/25 mg once a day. 2. Lipitor 40 mg a day. 3. Hydrocodone p.r.n. 4. Aspirin 81 mg a day. 5. Vitamin D. 6. MegaRed multivitamins. 7. Calcium. 8. Magnesium.  PRIMARY CARE PHYSICIAN:  Dr. [name].  NEUROLOGIST:  Joycelyn Schmid, MD  CARDIOLOGIST:  Peter M. Swaziland, M.D.  GASTROENTEROLOGIST:  Llana Aliment. Randa Evens, M.D.  CURRENT MEDICAL HISTORY:  Includes: 1. Hypertension. 2. Hypercholesteremia. 3. History of hiatal hernia. 4. Osteoarthritis.  REVIEW OF SYSTEMS:  Negative for any strokes, seizures, or convulsions. He had one previous episode of lightheadedness, dizziness, previously evaluated by neurologist without any diagnosis of vertigo or TIA. CARDIOVASCULAR:  Denies any shortness of breath.  Recently, he has had some in the past.  He has currently had a stress test with a questionable blockage.   He went on to catheterization, which was unremarkable.  He denies any other chest pressures, irregular heart rhythms, or intolerance to activities.  PULMONARY:  He denies any shortness of breath, or productive cough.  No wheezing.  No history of COPD or tuberculosis.  GU:  Unremarkable for any history of prostate kidney problems or stones in the past.  ENDOCRINE:  He denies any thyroid or diabetic issues.  GI:  He does have a history of hiatal hernia.  He has had no ulcers, reflux disease, or gallbladder problems in the past.  He denies any problems with any jaundice or hepatitis.  PAST SURGICAL HISTORY:  He has had multiple knee scopes in the past.  He recently had a left total knee arthroplasty.  FAMILY MEDICAL HISTORY:  His father is deceased.  Mother is also deceased.  SOCIAL HISTORY:  He is married.  Currently works as a Nutritional therapist.  He has stopped smoking 45 years.  Previously, he has occasional beer.  Three grown children.  He would like to do his physical therapy again postop at [facility] as an outpatient basis.  He also used [place] and he would like to use them again.  PHYSICAL EXAMINATION:  VITAL SIGNS:  Patient is 5 feet 10 inches, weight 220 pounds, blood pressure is 132/80, pulse of 80, respirations are  14, and nonlabored.  He is afebrile. GENERAL:  This is an healthy-appearing, well-developed gentleman, conscious, alert, appropriate, appears to be good historian. NECK:  Supple.  No palpable lymphadenopathy.  He has got good range of motion. CHEST:  Lung sounds were clear throughout. HEART:  Regular rate and rhythm. ABDOMEN:  Soft.  Bowel sounds present. EXTREMITIES:  He had good range of motion both upper extremities. LOWER EXTREMITY:  He has got full range of motion of both hips.  His left knee, he lacks about a degree to full extension.  He can flex it back to about 115.  No instability.  He has got a well-healed midline surgical incision.  His calf is soft and  nontender.  His right knee, he has obvious varus deformity.  He lacks about 5 degrees extension.  He can flex it back to 120.  He has no instability.  Calf is soft and nontender.  He has got good motion of both ankles. Peripheral vascular and carotid pulses were 2+.  No bruits.  Radial pulses were 2+.  Posterior tibial pulses were 2+.  He had no lower extremity edema, venous stasis changes. NEURO:  Patient is conscious, alert, and appropriate.  He had no gross neurologic defects noted. BREAST, RECTAL, AND GU EXAMS:  Deferred.  IMPRESSION: 1. End-stage osteoarthritis of the right knee with varus deformity     with bone-on-bone medial compartment with a history of recent left     total knee arthroplasty. 2. Hypertension. 3. Hypercholesterolemia. 4. History of hiatal hernia.  PLAN:  The patient will undergo routine labs and tests prior to having a right total knee arthroplasty by Dr. [name] at [facility] on September 14, 2011.     [name], P.A.   ______________________________ Erasmo Leventhal, M.D.    RWK/MEDQ  D:  08/30/2011  T:  08/31/2011  Job:  [id number]  cc:   Erasmo Leventhal, M.D. Fax: [phone]  Electronically Signed by Arlyn Leak P.A. on 09/01/2011 09:15:29 AM Electronically Signed by Eugenia Mcalpine M.D. on 09/17/2011 11:49:12 AM

## 2011-09-17 NOTE — Op Note (Signed)
NAME:  Jordan Vazquez, Jordan Vazquez NO.:  [id number]  MEDICAL RECORD NO.:  [id number]  LOCATION:  1621                         FACILITY:  [facility]  PHYSICIAN:  Erasmo Leventhal, M.D.DATE OF BIRTH:  July 26, 1946  DATE OF PROCEDURE:  09/14/2011 DATE OF DISCHARGE:                              OPERATIVE REPORT   PREOPERATIVE DIAGNOSIS:  Right knee end-stage bone against bone osteoarthritis, failed conservative treatment.  POSTOPERATIVE DIAGNOSIS:  Right knee end-stage bone against bone osteoarthritis, failed conservative treatment.  PROCEDURE:  Right total knee arthroplasty.  SURGEON:  Erasmo Leventhal, MD  ASSISTANT:  [name], P.A.C.  ANESTHESIA:  Spinal with monitored anesthesia care.  ESTIMATED BLOOD LOSS:  Less than 50 cc.  DRAINS:  One Hemovac.  COMPLICATIONS:  None.  TOURNIQUET TIME:  1 hour 30 minutes at 250 mmHg.  DISPOSITION:  PACU, stable.  COUNTS:  Correct.  OPERATIVE IMPLANTS:  [employer], size 6 femur, size 6 tibia, 12.5 mm posterior stabilized rotating platform tibial insert, and a 38 mm all-polyethylene patella, cemented.  OPERATIVE DETAILS:  The patient was counseled in the holding area. Correct side was identified and marked appropriately.  Chart reviewed and signed appropriately.  On the way to the operating room, IV antibiotics were given.  In the OR, he was given some sedation and spinal anesthetic was administered by anesthesiologist.  He was laid supine.  All extremities well padded and bumped.  Foley catheter was placed under sterile technique by the OR circulating nurse.  The right lower extremity was elevated and found to be in flexion contracture.  He was then __________ could flex to 110 degrees.  He was elevated.  He was prepped with DuraPrep, draped in sterile fashion.  Standard time-out was done, confirmed by all involved in the room.  Standard Esmarch tourniquet was inflated to 250 mmHg.  A  straight midline incision was made in the skin and subcutaneous tissue.  Medial soft tissue flaps were developed at appropriate level.  A medial parapatellar arthrotomy was performed.  Proximal medial soft tissue release was done.  Patella was retracted out of the way.  Cruciate ligaments were resected.  Bone against bone appearance in all 3 compartments.  Starter hole was made in the distal femur.  The canal was irrigated until effluent was clear. Intramedullary rod was placed.  We chose a 5-degree valgus cut and a 10 mm cut off the distal femur due to its flexion contracture.  Distal femur was found to be size #6.  Rotation marks were set and a cutting block was applied for size #6.  Medial and lateral menisci removed under direct visualization.  Posterior neurovascular structures were protected and genicular vessels were coagulated.  Tibia was subluxed anteriorly. Extramedullary alignment was used on tibia.  I chose a 2 mm cut on the deficient side medially at 0-degree slope.  Posteromedial and posterior femoral osteophytes were removed under direct visualization. Flexion/extension blocks were tangential, were well balanced.  Tibia was subluxed anteriorly, found to be a size 6.  Osteophytes removed and reamer punch was performed and then femoral box cut.  At this time, a size 6 femur, size  6 tibia, 10 mm insert, well balanced flexion and extension gaps.  Varus and valgus stress, parapatellar was found to be size #38.  I was resecting the patella when the clamps broke, piece of metal was found, sono was brought in to confirm there was no other metal in the knee at this point in time.  __________ cut appropriately.  Knee was then flexed, copiously irrigated with pulsatile lavage. Utilizing modern cement technique, all components were cemented into place.  Size 6 tibia, size 6 femur, with 38 mm patella all cemented.  At this time, we did a trial with 10 and then we did a trial with 10  to 12.5.  The 12.5 had excellent flexion-extension gaps. Varus and valgus stress at 0, 30, 60, and 90 degrees of flexion.  Patella tracked anatomically.  Trial was removed.  Excess cement was removed.  Gelfoam was placed posteriorly and  __________ posterior stabilizing rotating platform tibial insert was implanted, and had well-balanced, well- aligned knee.  Medium Hemovac drain was placed.  Knee was flexed to 90 and arthrotomy was closed with Vicryl suture at 90 and subcu with Vicryl, skin with subcuticular suture.  Steri-Strips applied.  Sterile dressing applied on the knee.  Drain was hooked to suction.  Tourniquet deflated.  Patient tolerated the procedure well with no complication or problems.  Sponge and needle counts were correct.  He was then taken out of the operating room to PACU in stable condition.  Also note, Mr. Oneida Alar, PA, assistance was needed throughout the entire case in prepping and draping, surgical incision, and throughout entire surgical procedure, closure, application of dressing.  Mr. Oneida Alar, PA, assistance was needed throughout the entire case.          ______________________________ Erasmo Leventhal, M.D.     RAC/MEDQ  D:  09/14/2011  T:  09/15/2011  Job:  [id number]  Electronically Signed by Eugenia Mcalpine M.D. on 09/17/2011 11:49:16 AM

## 2011-09-20 ENCOUNTER — Ambulatory Visit: Payer: Medicare Other | Admitting: Rehabilitative and Restorative Service Providers"

## 2011-10-05 ENCOUNTER — Ambulatory Visit: Payer: Medicare Other | Attending: Specialist | Admitting: Rehabilitative and Restorative Service Providers"

## 2011-10-05 DIAGNOSIS — IMO0001 Reserved for inherently not codable concepts without codable children: Secondary | ICD-10-CM | POA: Insufficient documentation

## 2011-10-05 DIAGNOSIS — Z96659 Presence of unspecified artificial knee joint: Secondary | ICD-10-CM | POA: Insufficient documentation

## 2011-10-05 DIAGNOSIS — M25569 Pain in unspecified knee: Secondary | ICD-10-CM | POA: Insufficient documentation

## 2011-10-05 DIAGNOSIS — M6281 Muscle weakness (generalized): Secondary | ICD-10-CM | POA: Insufficient documentation

## 2011-10-11 ENCOUNTER — Ambulatory Visit: Payer: Medicare Other | Admitting: Rehabilitation

## 2011-10-13 ENCOUNTER — Ambulatory Visit: Payer: Medicare Other | Admitting: Rehabilitation

## 2011-10-19 ENCOUNTER — Ambulatory Visit: Payer: Medicare Other | Attending: Specialist | Admitting: Rehabilitative and Restorative Service Providers"

## 2011-10-19 DIAGNOSIS — Z96659 Presence of unspecified artificial knee joint: Secondary | ICD-10-CM | POA: Insufficient documentation

## 2011-10-19 DIAGNOSIS — M6281 Muscle weakness (generalized): Secondary | ICD-10-CM | POA: Insufficient documentation

## 2011-10-19 DIAGNOSIS — IMO0001 Reserved for inherently not codable concepts without codable children: Secondary | ICD-10-CM | POA: Insufficient documentation

## 2011-10-19 DIAGNOSIS — M25569 Pain in unspecified knee: Secondary | ICD-10-CM | POA: Insufficient documentation

## 2011-10-21 ENCOUNTER — Ambulatory Visit: Payer: Medicare Other | Admitting: Rehabilitation

## 2011-10-25 ENCOUNTER — Ambulatory Visit: Payer: Medicare Other | Admitting: Rehabilitative and Restorative Service Providers"

## 2011-10-27 ENCOUNTER — Ambulatory Visit: Payer: Medicare Other | Admitting: Rehabilitative and Restorative Service Providers"

## 2011-11-01 ENCOUNTER — Ambulatory Visit: Payer: Medicare Other | Admitting: Rehabilitative and Restorative Service Providers"

## 2011-11-03 ENCOUNTER — Ambulatory Visit: Payer: Medicare Other | Admitting: Rehabilitation

## 2011-11-08 ENCOUNTER — Ambulatory Visit: Payer: Medicare Other | Admitting: Physical Therapy

## 2011-11-11 ENCOUNTER — Ambulatory Visit: Payer: Medicare Other | Admitting: Rehabilitation

## 2011-11-15 ENCOUNTER — Ambulatory Visit: Payer: Medicare Other | Admitting: Rehabilitative and Restorative Service Providers"

## 2011-11-17 ENCOUNTER — Ambulatory Visit: Payer: Medicare Other | Attending: Specialist | Admitting: Rehabilitative and Restorative Service Providers"

## 2011-11-17 DIAGNOSIS — Z96659 Presence of unspecified artificial knee joint: Secondary | ICD-10-CM | POA: Insufficient documentation

## 2011-11-17 DIAGNOSIS — M6281 Muscle weakness (generalized): Secondary | ICD-10-CM | POA: Insufficient documentation

## 2011-11-17 DIAGNOSIS — M25569 Pain in unspecified knee: Secondary | ICD-10-CM | POA: Insufficient documentation

## 2011-11-17 DIAGNOSIS — IMO0001 Reserved for inherently not codable concepts without codable children: Secondary | ICD-10-CM | POA: Insufficient documentation

## 2011-11-22 ENCOUNTER — Ambulatory Visit: Payer: Medicare Other | Admitting: Rehabilitative and Restorative Service Providers"

## 2011-11-24 ENCOUNTER — Ambulatory Visit: Payer: Medicare Other | Admitting: Rehabilitative and Restorative Service Providers"

## 2011-11-29 ENCOUNTER — Ambulatory Visit: Payer: Medicare Other | Admitting: Rehabilitative and Restorative Service Providers"

## 2011-12-01 ENCOUNTER — Encounter: Payer: Medicare Other | Admitting: Rehabilitative and Restorative Service Providers"

## 2011-12-03 ENCOUNTER — Encounter (HOSPITAL_COMMUNITY): Payer: Self-pay | Admitting: Pharmacy Technician

## 2011-12-03 ENCOUNTER — Encounter (HOSPITAL_COMMUNITY): Payer: Self-pay | Admitting: *Deleted

## 2011-12-05 NOTE — H&P (Signed)
Jordan Vazquez is an 66 y.o. male.    Chief Complaint: Right knee infection/inflammation reaction to joint prosthesis   HPI: Pt is a 66 y.o. male complaining of swelling and some pain for a couple of weeks, status post right total knee arthroplasty 3 months ago. Swelling and pain has been increasing slightly for a couple of weeks. Pt presented to the clinic with his symptoms and where his knee was aspirated and strep grew out. Various options are discussed with the patient. Risks, benefits and expectations were discussed with the patient. Patient understand the risks, benefits and expectations and wishes to proceed with removal of the poly with placement of antibiotic implants.   PCP:  Lillia Mountain, MD, MD  D/C Plans:  Home with HHPT and RN  Post-op Meds:  Rx given for ASA, Robaxin, Iron, Colace and MiraLax  Tranexamic Acid:  Not to be given  PMH: Past Medical History  Diagnosis Date  . Hypertension   . 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 Hernia repair     right inguinal hernia surgery   . Joint replacement     left knee 5/12, right TKA 10/12   . Cardiac catheterization     16 years ago negative     Social History:  reports that he has never smoked. He has never used smokeless tobacco. He reports that he drinks about 1.2 ounces of alcohol per week. He reports that he does not use illicit drugs.  Allergies:  Allergies  Allergen Reactions  . Niacin And Related Itching    Medications: No current facility-administered medications for this encounter.   Current Outpatient Prescriptions  Medication Sig Dispense Refill  . aspirin 162 MG EC tablet Take 162 mg by mouth every morning.      [name] atenolol-chlorthalidone (TENORETIC) 50-25 MG per tablet Take 1 tablet by mouth every morning.      [name] atorvastatin (LIPITOR) 40 MG tablet Take 40 mg by mouth every morning.      . cholecalciferol (VITAMIN D) 1000 UNITS  tablet Take 1,000 Units by mouth daily.      . diclofenac (VOLTAREN) 75 MG EC tablet Take 75 mg by mouth 2 (two) times daily.      [name] HYDROcodone-acetaminophen (NORCO) 5-325 MG per tablet Take 1-2 tablets by mouth every 6 (six) hours as needed. Pain      . methocarbamol (ROBAXIN) 500 MG tablet Take 500 mg by mouth every 8 (eight) hours as needed. Muscle spasm      . Multiple Vitamin (MULITIVITAMIN WITH MINERALS) TABS Take 1 tablet by mouth daily.      . Omega-3 Fatty Acids (SEA-OMEGA 30 PO) Take 1 capsule by mouth daily.        ROS: Review of Systems  Constitutional: Negative.   HENT: Negative.   Eyes: Negative.   Respiratory: Negative.   Cardiovascular: Negative.   Gastrointestinal: Negative.   Genitourinary: Negative.   Musculoskeletal: Positive for joint pain.  Skin: Negative.   Neurological: Negative.   Endo/Heme/Allergies: Negative.   Psychiatric/Behavioral: Negative.      Physican Exam: Physical Exam  Constitutional: He is oriented to person, place, and time and well-developed, well-nourished, and in no distress.  HENT:  Head: Normocephalic and atraumatic.  Nose: Nose normal.  Mouth/Throat: Oropharynx is clear and moist.  Eyes: Pupils are equal, round, and reactive to light.  Neck: Neck supple. No JVD present.  No tracheal deviation present. No thyromegaly present.  Cardiovascular: Normal rate, regular rhythm, normal heart sounds and intact distal pulses.  Exam reveals no gallop.   No murmur heard. Pulmonary/Chest: Effort normal and breath sounds normal. No stridor. No respiratory distress. He has no wheezes. He has no rales. He exhibits no tenderness.  Abdominal: Soft. There is no tenderness. There is no guarding.  Musculoskeletal:       Right knee: He exhibits swelling. He exhibits normal range of motion, no effusion, no ecchymosis, no deformity and no erythema. tenderness found.  Lymphadenopathy:    He has no cervical adenopathy.  Neurological: He is alert and  oriented to person, place, and time.  Skin: Skin is warm and dry. No rash noted. No erythema. No pallor.  Psychiatric: Affect and judgment normal.     Assessment/Plan Assessment: Right knee infection/inflammation reaction to joint prosthesis    Plan: Patient will undergo a removal of the poly with placement of antibiotic implants of the right knee. Risks benefits and expectation were discussed with the patient. Patient understand risks, benefits and expectation and wishes to proceed.   Anastasio Auerbach Ayaka Andes   PAC  12/05/2011, 11:23 AM

## 2011-12-06 ENCOUNTER — Encounter (HOSPITAL_COMMUNITY): Payer: Self-pay | Admitting: Anesthesiology

## 2011-12-06 ENCOUNTER — Encounter (HOSPITAL_COMMUNITY): Payer: Self-pay | Admitting: *Deleted

## 2011-12-06 ENCOUNTER — Inpatient Hospital Stay (HOSPITAL_COMMUNITY): Payer: Medicare Other | Admitting: Anesthesiology

## 2011-12-06 ENCOUNTER — Encounter: Payer: Medicare Other | Admitting: Rehabilitative and Restorative Service Providers"

## 2011-12-06 ENCOUNTER — Inpatient Hospital Stay (HOSPITAL_COMMUNITY)
Admission: RE | Admit: 2011-12-06 | Discharge: 2011-12-08 | DRG: 487 | Disposition: A | Discharge status: Home Health | Payer: Medicare Other | Source: Ambulatory Visit | Attending: Orthopedic Surgery | Admitting: Orthopedic Surgery

## 2011-12-06 ENCOUNTER — Encounter (HOSPITAL_COMMUNITY)
Admission: RE | Disposition: A | Discharge status: Home Health | Payer: Self-pay | Source: Ambulatory Visit | Attending: Orthopedic Surgery

## 2011-12-06 DIAGNOSIS — T8459XA Infection and inflammatory reaction due to other internal joint prosthesis, initial encounter: Secondary | ICD-10-CM

## 2011-12-06 DIAGNOSIS — Z96659 Presence of unspecified artificial knee joint: Secondary | ICD-10-CM

## 2011-12-06 DIAGNOSIS — M129 Arthropathy, unspecified: Secondary | ICD-10-CM | POA: Diagnosis present

## 2011-12-06 DIAGNOSIS — Y831 Surgical operation with implant of artificial internal device as the cause of abnormal reaction of the patient, or of later complication, without mention of misadventure at the time of the procedure: Secondary | ICD-10-CM | POA: Diagnosis present

## 2011-12-06 DIAGNOSIS — I10 Essential (primary) hypertension: Secondary | ICD-10-CM | POA: Diagnosis present

## 2011-12-06 DIAGNOSIS — T8450XA Infection and inflammatory reaction due to unspecified internal joint prosthesis, initial encounter: Principal | ICD-10-CM | POA: Diagnosis present

## 2011-12-06 HISTORY — PX: I & D KNEE WITH POLY EXCHANGE: SHX5024

## 2011-12-06 HISTORY — DX: Essential (primary) hypertension: I10

## 2011-12-06 HISTORY — DX: Unspecified osteoarthritis, unspecified site: M19.90

## 2011-12-06 LAB — CBC
Hemoglobin: 14.5 g/dL (ref 13.0–17.0)
RBC: 4.91 MIL/uL (ref 4.22–5.81)
WBC: 13.8 10*3/uL — ABNORMAL HIGH (ref 4.0–10.5)

## 2011-12-06 LAB — GRAM STAIN

## 2011-12-06 LAB — BASIC METABOLIC PANEL
BUN: 20 mg/dL (ref 6–23)
CO2: 26 mEq/L (ref 19–32)
Calcium: 9.8 mg/dL (ref 8.4–10.5)
Creatinine, Ser: 0.85 mg/dL (ref 0.50–1.35)
GFR calc non Af Amer: 89 mL/min — ABNORMAL LOW (ref >90)
Glucose, Bld: 102 mg/dL — ABNORMAL HIGH (ref 70–99)

## 2011-12-06 LAB — SURGICAL PCR SCREEN
MRSA, PCR: NEGATIVE
Staphylococcus aureus: NEGATIVE

## 2011-12-06 SURGERY — IRRIGATION AND DEBRIDEMENT KNEE WITH POLY EXCHANGE
Anesthesia: General | Site: Knee | Laterality: Right | Wound class: Dirty or Infected

## 2011-12-06 MED ORDER — HYDROMORPHONE HCL PF 1 MG/ML IJ SOLN
0.5000 mg | INTRAMUSCULAR | Status: DC | PRN
  Administered 2011-12-06 – 2011-12-07 (×3): 1 mg via INTRAVENOUS
  Administered 2011-12-07: 2 mg via INTRAVENOUS
  Filled 2011-12-06: qty 1
  Filled 2011-12-06: qty 2
  Filled 2011-12-06 (×2): qty 1

## 2011-12-06 MED ORDER — VANCOMYCIN HCL 1000 MG IV SOLR
1000.0000 mg | INTRAVENOUS | Status: DC | PRN
  Administered 2011-12-06: 1000 mg via INTRAVENOUS

## 2011-12-06 MED ORDER — TOBRAMYCIN SULFATE 1.2 G IJ SOLR
INTRAMUSCULAR | Status: DC | PRN
  Administered 2011-12-06: 2.4 g

## 2011-12-06 MED ORDER — ALUM & MAG HYDROXIDE-SIMETH 200-200-20 MG/5ML PO SUSP: 30.0000 mL | ORAL | Status: DC | PRN

## 2011-12-06 MED ORDER — CHLORHEXIDINE GLUCONATE 4 % EX LIQD: Freq: Once | CUTANEOUS | Status: DC

## 2011-12-06 MED ORDER — ACETAMINOPHEN 10 MG/ML IV SOLN
INTRAVENOUS | Status: AC
  Filled 2011-12-06: qty 100

## 2011-12-06 MED ORDER — NEOSTIGMINE METHYLSULFATE 1 MG/ML IJ SOLN
INTRAMUSCULAR | Status: DC | PRN
  Administered 2011-12-06: 2 mg via INTRAVENOUS

## 2011-12-06 MED ORDER — VANCOMYCIN HCL IN DEXTROSE 1-5 GM/200ML-% IV SOLN
INTRAVENOUS | Status: AC
  Filled 2011-12-06: qty 200

## 2011-12-06 MED ORDER — HYDROMORPHONE HCL PF 1 MG/ML IJ SOLN
INTRAMUSCULAR | Status: DC | PRN
  Administered 2011-12-06 (×2): 1 mg via INTRAVENOUS

## 2011-12-06 MED ORDER — HYDROMORPHONE HCL PF 1 MG/ML IJ SOLN
INTRAMUSCULAR | Status: AC
  Administered 2011-12-06: 0.5 mg via INTRAVENOUS
  Filled 2011-12-06: qty 1

## 2011-12-06 MED ORDER — SUCCINYLCHOLINE CHLORIDE 20 MG/ML IJ SOLN
INTRAMUSCULAR | Status: DC | PRN
  Administered 2011-12-06: 100 mg via INTRAVENOUS

## 2011-12-06 MED ORDER — SODIUM CHLORIDE 0.9 % IV SOLN
INTRAVENOUS | Status: DC
  Administered 2011-12-06: 23:00:00 via INTRAVENOUS
  Filled 2011-12-06 (×3): qty 1000

## 2011-12-06 MED ORDER — METOCLOPRAMIDE HCL 5 MG/ML IJ SOLN: 5.0000 mg | Freq: Three times a day (TID) | INTRAMUSCULAR | Status: DC | PRN

## 2011-12-06 MED ORDER — DIPHENHYDRAMINE HCL 25 MG PO CAPS: 25.0000 mg | ORAL_CAPSULE | Freq: Four times a day (QID) | ORAL | Status: DC | PRN

## 2011-12-06 MED ORDER — DOCUSATE SODIUM 100 MG PO CAPS
100.0000 mg | ORAL_CAPSULE | Freq: Two times a day (BID) | ORAL | Status: DC
  Administered 2011-12-06 – 2011-12-08 (×4): 100 mg via ORAL
  Filled 2011-12-06 (×5): qty 1

## 2011-12-06 MED ORDER — VANCOMYCIN HCL 1000 MG IV SOLR
INTRAVENOUS | Status: DC | PRN
  Administered 2011-12-06: 2000 mg

## 2011-12-06 MED ORDER — HYDROMORPHONE HCL PF 1 MG/ML IJ SOLN
0.2500 mg | INTRAMUSCULAR | Status: DC | PRN
  Administered 2011-12-06 (×4): 0.5 mg via INTRAVENOUS

## 2011-12-06 MED ORDER — BISACODYL 5 MG PO TBEC: 5.0000 mg | DELAYED_RELEASE_TABLET | Freq: Every day | ORAL | Status: DC | PRN

## 2011-12-06 MED ORDER — ONDANSETRON HCL 4 MG/2ML IJ SOLN
4.0000 mg | Freq: Four times a day (QID) | INTRAMUSCULAR | Status: DC | PRN
  Administered 2011-12-08 (×2): 4 mg via INTRAVENOUS
  Filled 2011-12-06 (×2): qty 2

## 2011-12-06 MED ORDER — SCOPOLAMINE 1 MG/3DAYS TD PT72
MEDICATED_PATCH | TRANSDERMAL | Status: AC
  Filled 2011-12-06: qty 1

## 2011-12-06 MED ORDER — TOBRAMYCIN SULFATE 1.2 G IJ SOLR
INTRAMUSCULAR | Status: AC
  Filled 2011-12-06: qty 2.4

## 2011-12-06 MED ORDER — ATENOLOL 50 MG PO TABS
50.0000 mg | ORAL_TABLET | Freq: Every day | ORAL | Status: DC
  Administered 2011-12-07 – 2011-12-08 (×2): 50 mg via ORAL
  Filled 2011-12-06 (×2): qty 1

## 2011-12-06 MED ORDER — FENTANYL CITRATE 0.05 MG/ML IJ SOLN
INTRAMUSCULAR | Status: DC | PRN
  Administered 2011-12-06 (×2): 50 ug via INTRAVENOUS
  Administered 2011-12-06: 100 ug via INTRAVENOUS
  Administered 2011-12-06: 50 ug via INTRAVENOUS

## 2011-12-06 MED ORDER — ROCURONIUM BROMIDE 100 MG/10ML IV SOLN
INTRAVENOUS | Status: DC | PRN
  Administered 2011-12-06: 20 mg via INTRAVENOUS

## 2011-12-06 MED ORDER — MIDAZOLAM HCL 5 MG/5ML IJ SOLN
INTRAMUSCULAR | Status: DC | PRN
  Administered 2011-12-06: 2 mg via INTRAVENOUS

## 2011-12-06 MED ORDER — ACETAMINOPHEN 10 MG/ML IV SOLN
INTRAVENOUS | Status: DC | PRN
  Administered 2011-12-06: 1000 mg via INTRAVENOUS

## 2011-12-06 MED ORDER — GLYCOPYRROLATE 0.2 MG/ML IJ SOLN
INTRAMUSCULAR | Status: DC | PRN
  Administered 2011-12-06: 0.3 mg via INTRAVENOUS

## 2011-12-06 MED ORDER — LIDOCAINE HCL (CARDIAC) 20 MG/ML IV SOLN
INTRAVENOUS | Status: DC | PRN
  Administered 2011-12-06: 80 mg via INTRAVENOUS

## 2011-12-06 MED ORDER — METOCLOPRAMIDE HCL 10 MG PO TABS: 5.0000 mg | ORAL_TABLET | Freq: Three times a day (TID) | ORAL | Status: DC | PRN

## 2011-12-06 MED ORDER — ATENOLOL 50 MG PO TABS
50.0000 mg | ORAL_TABLET | Freq: Once | ORAL | Status: AC
  Administered 2011-12-06: 50 mg via ORAL
  Filled 2011-12-06: qty 1

## 2011-12-06 MED ORDER — VANCOMYCIN HCL IN DEXTROSE 1-5 GM/200ML-% IV SOLN
1000.0000 mg | Freq: Two times a day (BID) | INTRAVENOUS | Status: DC
  Filled 2011-12-06: qty 200

## 2011-12-06 MED ORDER — ATENOLOL-CHLORTHALIDONE 50-25 MG PO TABS: 1.0000 | ORAL_TABLET | ORAL | Status: DC

## 2011-12-06 MED ORDER — PHENOL 1.4 % MT LIQD: 1.0000 | OROMUCOSAL | Status: DC | PRN

## 2011-12-06 MED ORDER — METHOCARBAMOL 100 MG/ML IJ SOLN: 500.0000 mg | Freq: Four times a day (QID) | INTRAVENOUS | Status: DC | PRN

## 2011-12-06 MED ORDER — PROMETHAZINE HCL 25 MG/ML IJ SOLN: 6.2500 mg | INTRAMUSCULAR | Status: DC | PRN

## 2011-12-06 MED ORDER — PROPOFOL 10 MG/ML IV EMUL
INTRAVENOUS | Status: DC | PRN
  Administered 2011-12-06: 200 mg via INTRAVENOUS

## 2011-12-06 MED ORDER — ZOLPIDEM TARTRATE 5 MG PO TABS: 5.0000 mg | ORAL_TABLET | Freq: Every evening | ORAL | Status: DC | PRN

## 2011-12-06 MED ORDER — FLEET ENEMA 7-19 GM/118ML RE ENEM: 1.0000 | ENEMA | Freq: Once | RECTAL | Status: AC | PRN

## 2011-12-06 MED ORDER — HYDROCODONE-ACETAMINOPHEN 7.5-325 MG PO TABS
1.0000 | ORAL_TABLET | ORAL | Status: DC
  Administered 2011-12-07 – 2011-12-08 (×9): 2 via ORAL
  Filled 2011-12-06 (×9): qty 2

## 2011-12-06 MED ORDER — VANCOMYCIN HCL 1000 MG IV SOLR
INTRAVENOUS | Status: AC
  Filled 2011-12-06: qty 2000

## 2011-12-06 MED ORDER — CHLORTHALIDONE 25 MG PO TABS
25.0000 mg | ORAL_TABLET | Freq: Every day | ORAL | Status: DC
  Administered 2011-12-07 – 2011-12-08 (×2): 25 mg via ORAL
  Filled 2011-12-06 (×2): qty 1

## 2011-12-06 MED ORDER — RIFAMPIN 300 MG PO CAPS
600.0000 mg | ORAL_CAPSULE | Freq: Two times a day (BID) | ORAL | Status: DC
  Administered 2011-12-06 – 2011-12-07 (×2): 600 mg via ORAL
  Filled 2011-12-06 (×3): qty 2

## 2011-12-06 MED ORDER — LACTATED RINGERS IV SOLN
INTRAVENOUS | Status: DC | PRN
  Administered 2011-12-06 (×2): via INTRAVENOUS

## 2011-12-06 MED ORDER — ONDANSETRON HCL 4 MG/2ML IJ SOLN
INTRAMUSCULAR | Status: DC | PRN
  Administered 2011-12-06: 4 mg via INTRAVENOUS

## 2011-12-06 MED ORDER — MENTHOL 3 MG MT LOZG: 1.0000 | LOZENGE | OROMUCOSAL | Status: DC | PRN

## 2011-12-06 MED ORDER — ONDANSETRON HCL 4 MG PO TABS: 4.0000 mg | ORAL_TABLET | Freq: Four times a day (QID) | ORAL | Status: DC | PRN

## 2011-12-06 MED ORDER — FERROUS SULFATE 325 (65 FE) MG PO TABS
325.0000 mg | ORAL_TABLET | Freq: Three times a day (TID) | ORAL | Status: DC
  Administered 2011-12-07 – 2011-12-08 (×4): 325 mg via ORAL
  Filled 2011-12-06 (×6): qty 1

## 2011-12-06 MED ORDER — POLYETHYLENE GLYCOL 3350 17 G PO PACK
17.0000 g | PACK | Freq: Two times a day (BID) | ORAL | Status: DC
  Administered 2011-12-06 – 2011-12-08 (×2): 17 g via ORAL
  Filled 2011-12-06 (×5): qty 1

## 2011-12-06 MED ORDER — METHOCARBAMOL 500 MG PO TABS
500.0000 mg | ORAL_TABLET | Freq: Four times a day (QID) | ORAL | Status: DC | PRN
  Administered 2011-12-07 – 2011-12-08 (×3): 500 mg via ORAL
  Filled 2011-12-06 (×3): qty 1

## 2011-12-06 MED ORDER — ROSUVASTATIN CALCIUM 20 MG PO TABS
20.0000 mg | ORAL_TABLET | Freq: Every day | ORAL | Status: DC
  Administered 2011-12-06 – 2011-12-07 (×2): 20 mg via ORAL
  Filled 2011-12-06 (×3): qty 1

## 2011-12-06 MED ORDER — RIVAROXABAN 10 MG PO TABS
10.0000 mg | ORAL_TABLET | ORAL | Status: DC
  Administered 2011-12-07: 10 mg via ORAL
  Filled 2011-12-06 (×2): qty 1

## 2011-12-06 SURGICAL SUPPLY — 67 items
BAG SPEC THK2 15X12 ZIP CLS (MISCELLANEOUS) ×1
BAG ZIPLOCK 12X15 (MISCELLANEOUS) ×2 IMPLANT
BANDAGE ELASTIC 4 VELCRO ST LF (GAUZE/BANDAGES/DRESSINGS) ×2 IMPLANT
BANDAGE ELASTIC 6 VELCRO ST LF (GAUZE/BANDAGES/DRESSINGS) ×2 IMPLANT
BANDAGE ESMARK 6X9 LF (GAUZE/BANDAGES/DRESSINGS) ×1 IMPLANT
BNDG CMPR 9X6 STRL LF SNTH (GAUZE/BANDAGES/DRESSINGS) ×1
BNDG ESMARK 6X9 LF (GAUZE/BANDAGES/DRESSINGS) ×2
CLOTH BEACON ORANGE TIMEOUT ST (SAFETY) ×2 IMPLANT
CONT SPECI 4OZ STER CLIK (MISCELLANEOUS) ×1 IMPLANT
CUFF TOURN SGL QUICK 34 (TOURNIQUET CUFF) ×2
CUFF TRNQT CYL 34X4X40X1 (TOURNIQUET CUFF) ×1 IMPLANT
DECANTER SPIKE VIAL GLASS SM (MISCELLANEOUS) ×1 IMPLANT
DRAPE EXTREMITY T 121X128X90 (DRAPE) ×2 IMPLANT
DRAPE POUCH INSTRU U-SHP 10X18 (DRAPES) ×2 IMPLANT
DRAPE U-SHAPE 47X51 STRL (DRAPES) ×2 IMPLANT
DRSG ADAPTIC 3X8 NADH LF (GAUZE/BANDAGES/DRESSINGS) ×1 IMPLANT
DRSG PAD ABDOMINAL 8X10 ST (GAUZE/BANDAGES/DRESSINGS) ×4 IMPLANT
DURAPREP 26ML APPLICATOR (WOUND CARE) ×2 IMPLANT
ELECT REM PT RETURN 9FT ADLT (ELECTROSURGICAL) ×2
ELECTRODE REM PT RTRN 9FT ADLT (ELECTROSURGICAL) ×1 IMPLANT
EVACUATOR 1/8 PVC DRAIN (DRAIN) ×2 IMPLANT
EVACUATOR SILICONE 100CC (DRAIN) ×2 IMPLANT
FACESHIELD LNG OPTICON STERILE (SAFETY) ×10 IMPLANT
FLOSEAL (HEMOSTASIS) ×1 IMPLANT
GAUZE XEROFORM 5X9 LF (GAUZE/BANDAGES/DRESSINGS) ×1 IMPLANT
GLOVE BIOGEL PI IND STRL 7.5 (GLOVE) ×1 IMPLANT
GLOVE BIOGEL PI IND STRL 8 (GLOVE) ×1 IMPLANT
GLOVE BIOGEL PI INDICATOR 7.5 (GLOVE) ×1
GLOVE BIOGEL PI INDICATOR 8 (GLOVE) ×1
GLOVE ECLIPSE 8.0 STRL XLNG CF (GLOVE) ×4 IMPLANT
GLOVE ORTHO TXT STRL SZ7.5 (GLOVE) ×4 IMPLANT
GLOVE SURG ORTHO 8.0 STRL STRW (GLOVE) ×2 IMPLANT
GOWN STRL NON-REIN LRG LVL3 (GOWN DISPOSABLE) ×2 IMPLANT
HANDPIECE INTERPULSE COAX TIP (DISPOSABLE) ×2
IMMOBILIZER KNEE 20 (SOFTGOODS)
IMMOBILIZER KNEE 20 THIGH 36 (SOFTGOODS) IMPLANT
INSERT PFC SIG STB SZ6 12.5MM (Knees) ×1 IMPLANT
KIT BASIN OR (CUSTOM PROCEDURE TRAY) ×2 IMPLANT
KIT STIMULAN RAPID CURE  10CC (Orthopedic Implant) ×2 IMPLANT
KIT STIMULAN RAPID CURE 10CC (Orthopedic Implant) IMPLANT
MANIFOLD NEPTUNE II (INSTRUMENTS) ×2 IMPLANT
NDL SAFETY ECLIPSE 18X1.5 (NEEDLE) ×1 IMPLANT
NEEDLE HYPO 18GX1.5 SHARP (NEEDLE) ×2
NS IRRIG 1000ML POUR BTL (IV SOLUTION) ×4 IMPLANT
PACK TOTAL JOINT (CUSTOM PROCEDURE TRAY) ×2 IMPLANT
PADDING CAST COTTON 6X4 STRL (CAST SUPPLIES) ×4 IMPLANT
PADDING WEBRIL 6 STERILE (GAUZE/BANDAGES/DRESSINGS) ×1 IMPLANT
POSITIONER SURGICAL ARM (MISCELLANEOUS) ×2 IMPLANT
SET HNDPC FAN SPRY TIP SCT (DISPOSABLE) ×1 IMPLANT
SPONGE GAUZE 4X4 12PLY (GAUZE/BANDAGES/DRESSINGS) ×3 IMPLANT
SPONGE LAP 18X18 X RAY DECT (DISPOSABLE) ×2 IMPLANT
STRIP CLOSURE SKIN 1/2X4 (GAUZE/BANDAGES/DRESSINGS) ×2 IMPLANT
SUCTION FRAZIER 12FR DISP (SUCTIONS) ×2 IMPLANT
SUT ETHILON 2 0 PS N (SUTURE) ×2 IMPLANT
SUT MNCRL AB 4-0 PS2 18 (SUTURE) ×2 IMPLANT
SUT PDS AB 1 CT1 27 (SUTURE) ×2 IMPLANT
SUT VIC AB 1 CT1 36 (SUTURE) ×6 IMPLANT
SUT VIC AB 2-0 CT1 27 (SUTURE) ×6
SUT VIC AB 2-0 CT1 TAPERPNT 27 (SUTURE) ×3 IMPLANT
SWAB COLLECTION DEVICE MRSA (MISCELLANEOUS) ×2 IMPLANT
SYR 50ML LL SCALE MARK (SYRINGE) ×1 IMPLANT
SYR CONTROL 10ML LL (SYRINGE) ×1 IMPLANT
TOWEL OR 17X26 10 PK STRL BLUE (TOWEL DISPOSABLE) ×4 IMPLANT
TRAY FOLEY CATH 14FRSI W/METER (CATHETERS) ×2 IMPLANT
TUBE ANAEROBIC SPECIMEN COL (MISCELLANEOUS) ×2 IMPLANT
WATER STERILE IRR 1500ML POUR (IV SOLUTION) ×2 IMPLANT
WRAP KNEE MAXI GEL POST OP (GAUZE/BANDAGES/DRESSINGS) ×2 IMPLANT

## 2011-12-06 NOTE — Progress Notes (Signed)
Telephone call from lab, no organisms seen and few WBC's present. Dr Charlann Boxer informed

## 2011-12-06 NOTE — Anesthesia Preprocedure Evaluation (Addendum)
Anesthesia Evaluation  Patient identified by MRN, date of birth, ID band Patient awake    Reviewed: Allergy & Precautions, H&P , NPO status , Patient's Chart, lab work & pertinent test results, reviewed documented beta blocker date and time   Airway Mallampati: II TM Distance: >3 FB Neck ROM: Full    Dental  (+) Teeth Intact   Pulmonary neg pulmonary ROS,  clear to auscultation        Cardiovascular hypertension, Pt. on medications Regular Normal Denies cardiac symptoms   Neuro/Psych Negative Neurological ROS  Negative Psych ROS   GI/Hepatic negative GI ROS, Neg liver ROS,   Endo/Other  Negative Endocrine ROS  Renal/GU negative Renal ROS  Genitourinary negative   Musculoskeletal negative musculoskeletal ROS (+)   Abdominal   Peds negative pediatric ROS (+)  Hematology negative hematology ROS (+)   Anesthesia Other Findings   Reproductive/Obstetrics negative OB ROS                           Anesthesia Physical Anesthesia Plan  ASA: II  Anesthesia Plan: General   Post-op Pain Management:    Induction: Intravenous  Airway Management Planned: Oral ETT  Additional Equipment:   Intra-op Plan:   Post-operative Plan: Extubation in OR  Informed Consent: I have reviewed the patients History and Physical, chart, labs and discussed the procedure including the risks, benefits and alternatives for the proposed anesthesia with the patient or authorized representative who has indicated his/her understanding and acceptance.     Plan Discussed with: CRNA and Surgeon  Anesthesia Plan Comments:         Anesthesia Quick Evaluation  

## 2011-12-06 NOTE — Interval H&P Note (Signed)
History and Physical Interval Note:  12/06/2011 5:18 PM  Jordan Vazquez  has presented today for surgery, with the diagnosis of Right Total Knee Infection  The various methods of treatment have been discussed with the patient and family. After consideration of risks, benefits and other options for treatment, the patient has consented to  Procedure(s): IRRIGATION AND DEBRIDEMENT KNEE WITH POLY EXCHANGE  OF RIGHT TOTAL KNEE REPLACEMENT as a surgical intervention .  The patients' history has been reviewed, patient examined, no change in status, stable for surgery.  I have reviewed the patients' chart and labs.  Questions were answered to the patient's satisfaction.     Shelda Pal

## 2011-12-06 NOTE — Transfer of Care (Signed)
Immediate Anesthesia Transfer of Care Note  Patient: Jordan Vazquez  Procedure(s) Performed:  IRRIGATION AND DEBRIDEMENT KNEE WITH POLY EXCHANGE - Right Total Knee Irrigation and Debridement with Poly Exchange  Patient Location: PACU  Anesthesia Type: General  Level of Consciousness: awake, alert  and patient cooperative  Airway & Oxygen Therapy: Patient Spontanous Breathing and Patient connected to face mask oxygen  Post-op Assessment: Report given to PACU RN, Post -op Vital signs reviewed and stable and Patient moving all extremities  Post vital signs: Reviewed and stable  Complications: No apparent anesthesia complications

## 2011-12-06 NOTE — Brief Op Note (Signed)
12/06/2011  8:00 PM  PATIENT:  Jordan Vazquez  66 y.o. male  PRE-OPERATIVE DIAGNOSIS:  Right Total Knee Infection  POST-OPERATIVE DIAGNOSIS:  right total knee infection  PROCEDURE:  Procedure(s): IRRIGATION AND DEBRIDEMENT KNEE WITH POLY EXCHANGE, placement of antibiotic beads, (Stimulan)  SURGEON:  Surgeon(s): Erasmo Leventhal, MD Shelda Pal, MD  PHYSICIAN ASSISTANT:  Lanney Gins, PA-C  ANESTHESIA:   general  EBL:  Total I/O In: 1000 [I.V.:1000] Out: 200 [Urine:200]  BLOOD ADMINISTERED:none  DRAINS: (1 medium) Hemovact drain(s) in the right knee with  Suction Open   LOCAL MEDICATIONS USED:  NONE  SPECIMEN:  Source of Specimen:  rigth knee joint fluid  DISPOSITION OF SPECIMEN:  microbiology for gram stain and culture  COUNTS:  YES  TOURNIQUET:   Total Tourniquet Time Documented: Thigh (Right) - 62 minutes  DICTATION: .Other Dictation: Dictation Number [id number]  PLAN OF CARE: Admit to inpatient   PATIENT DISPOSITION:  PACU - hemodynamically stable.   Delay start of Pharmacological VTE agent (>24hrs) due to surgical blood loss or risk of bleeding:  {YES/NO/NOT APPLICABLE:20182

## 2011-12-06 NOTE — Anesthesia Postprocedure Evaluation (Signed)
  Anesthesia Post-op Note  Patient: Jordan Vazquez  Procedure(s) Performed:  IRRIGATION AND DEBRIDEMENT KNEE WITH POLY EXCHANGE - Right Total Knee Irrigation and Debridement with Poly Exchange  Patient Location: PACU  Anesthesia Type: General  Level of Consciousness: oriented and sedated  Airway and Oxygen Therapy: Patient Spontanous Breathing and Patient connected to nasal cannula oxygen  Post-op Pain: mild  Post-op Assessment: Post-op Vital signs reviewed, Patient's Cardiovascular Status Stable, Respiratory Function Stable, Patent Airway and No signs of Nausea or vomiting  Post-op Vital Signs: stable  Complications: No apparent anesthesia complications

## 2011-12-06 NOTE — Preoperative (Signed)
Beta Blockers   Reason not to administer Beta Blockers:Not Applicable 

## 2011-12-07 DIAGNOSIS — Y849 Medical procedure, unspecified as the cause of abnormal reaction of the patient, or of later complication, without mention of misadventure at the time of the procedure: Secondary | ICD-10-CM

## 2011-12-07 DIAGNOSIS — T8450XA Infection and inflammatory reaction due to unspecified internal joint prosthesis, initial encounter: Secondary | ICD-10-CM

## 2011-12-07 LAB — BASIC METABOLIC PANEL
BUN: 17 mg/dL (ref 6–23)
Chloride: 100 mEq/L (ref 96–112)
Creatinine, Ser: 0.81 mg/dL (ref 0.50–1.35)
GFR calc Af Amer: >90 mL/min (ref >90)
GFR calc non Af Amer: >90 mL/min (ref >90)

## 2011-12-07 LAB — CBC
HCT: 41.5 % (ref 39.0–52.0)
MCHC: 32 g/dL (ref 30.0–36.0)
MCV: 89.4 fL (ref 78.0–100.0)
Platelets: 665 10*3/uL — ABNORMAL HIGH (ref 150–400)
RDW: 15 % (ref 11.5–15.5)
WBC: 13 10*3/uL — ABNORMAL HIGH (ref 4.0–10.5)

## 2011-12-07 MED ORDER — SODIUM CHLORIDE 0.9 % IJ SOLN
10.0000 mL | INTRAMUSCULAR | Status: DC | PRN
  Administered 2011-12-08 (×2): 10 mL

## 2011-12-07 MED ORDER — VANCOMYCIN HCL IN DEXTROSE 1-5 GM/200ML-% IV SOLN
1000.0000 mg | Freq: Two times a day (BID) | INTRAVENOUS | Status: AC
  Administered 2011-12-07: 1000 mg via INTRAVENOUS
  Filled 2011-12-07: qty 200

## 2011-12-07 MED ORDER — AMPICILLIN SODIUM 2 G IJ SOLR
2.0000 g | Freq: Four times a day (QID) | INTRAMUSCULAR | Status: DC
  Administered 2011-12-07 – 2011-12-08 (×4): 2 g via INTRAVENOUS
  Filled 2011-12-07 (×7): qty 2000

## 2011-12-07 NOTE — Progress Notes (Signed)
OT Screen:  Pt was screened for OT services.  No acute OT needs were identified.  Will sign off. [place], [place] [phone] 12/07/2011

## 2011-12-07 NOTE — Progress Notes (Signed)
Subjective: 1 Day Post-Op Procedure(s) (LRB): IRRIGATION AND DEBRIDEMENT KNEE WITH POLY EXCHANGE (Right)   Patient reports pain as mild. No events. Feels much better today than after his last surgery.  Objective:   VITALS:   Filed Vitals:   12/07/11 0615  BP: 109/72  Pulse: 83  Temp: 98.1 F (36.7 C)  Resp: 16    Neurovascular intact Dorsiflexion/Plantar flexion intact Incision: dressing C/D/I No cellulitis present Compartment soft  LABS  Basename 12/07/11 0355 12/06/11 1518  HGB 13.3 14.5  HCT 41.5 43.3  WBC 13.0* 13.8*  PLT 665* 723*     Basename 12/07/11 0355 12/06/11 1518  NA 135 136  K 4.9 4.1  BUN 17 20  CREATININE 0.81 0.85  GLUCOSE 98 102*     Assessment/Plan: 1 Day Post-Op Procedure(s) (LRB): IRRIGATION AND DEBRIDEMENT KNEE WITH POLY EXCHANGE (Right)  HV drain d/c'ed Foley cath d/c'ed I&D consult PICC line placement Advance diet Up with therapy D/C IV fluids Discharge home with home health upon discharge   Anastasio Auerbach. Ewart Carrera   PAC  12/07/2011, 8:35 AM

## 2011-12-07 NOTE — Consult Note (Signed)
Infectious Diseases Initial Consultation  Reason for Consultation:  PJI   HPI: Jordan Vazquez is a 66 y.o. 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 [name] with a c/o pain and swelling of right TKA done 3 months ago.  Subsequent tap revealed Enterococcus, pansensitive (initially reported as Streptococcus).  He was then taken to the OR 1/21 for polyexchange.  No report of fever, chills.    Past Medical History  Diagnosis Date  . Hypertension   . 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 Niacin And Related Itching    Current antibiotics:   MEDICATIONS:    . ampicillin (OMNIPEN) IV  2 g Intravenous Q6H  . tenormin  50 mg Oral Daily  . chlorthalidone  25 mg Oral Daily  . docusate sodium  100 mg Oral BID  . ferrous sulfate  325 mg Oral TID PC  . HYDROcodone-acetaminophen  1-2 tablet Oral Q4H  . polyethylene glycol  17 g Oral BID  . rivaroxaban  10 mg Oral Q24H  . rosuvastatin  20 mg Oral q1800  . vancomycin  1,000 mg Intravenous Q12H  . DISCONTD: atenolol-chlorthalidone  1 tablet Oral Q0700  . DISCONTD: chlorhexidine   Topical Once  . DISCONTD: rifampin  600 mg Oral Q12H  . DISCONTD: vancomycin  1,000 mg Intravenous Q12H    History  Substance Use Topics  . Smoking status: Never Smoker   . Smokeless tobacco: Never Used  . Alcohol Use: 1.2 oz/week    2 Cans of beer per week    History reviewed. No pertinent family history.  Review of Systems - Negative except as per HPI  OBJECTIVE: Temp:  [97 F (36.1 C)-98.5 F (36.9 C)] 97.5 F (36.4 C) (01/22 1428) Pulse Rate:  [60-93] 62  (01/22 1428) Resp:  [14-18] 18  (01/22 1428) BP: (97-146)/(56-80) 108/65 mmHg (01/22 1428) SpO2:  [92 %-98 %] 92 % (01/22 1428) Weight:  [203 lb 14.8 oz (92.5 kg)] 203 lb 14.8 oz (92.5 kg) (01/21 2132) General appearance: alert, cooperative and no distress Resp: clear to auscultation  bilaterally Cardio: regular rate and rhythm, S1, S2 normal, no murmur, click, rub or gallop GI: soft, non-tender; bowel sounds normal; no masses,  no organomegaly Skin: Skin color, texture, turgor normal. No rashes or lesions  LABS: Results for orders placed during the hospital encounter of 12/06/11 (from the past 48 hour(s))  SURGICAL PCR SCREEN     Status: Normal   Collection Time   12/06/11  2:05 PM      Component Value Range Comment   MRSA, PCR NEGATIVE  NEGATIVE     Staphylococcus aureus NEGATIVE  NEGATIVE    CBC     Status: Abnormal   Collection Time   12/06/11  3:18 PM      Component Value Range Comment   WBC 13.8 (*) 4.0 - 10.5 (K/uL)    RBC 4.91  4.22 - 5.81 (MIL/uL)    Hemoglobin 14.5  13.0 - 17.0 (g/dL)    HCT 14.7  82.9 - 56.2 (%)    MCV 88.2  78.0 - 100.0 (fL)    MCH 29.5  26.0 - 34.0 (pg)    MCHC 33.5  30.0 - 36.0 (g/dL)    RDW 13.0  86.5 - 78.4 (%)    Platelets 723 (*) 150 - 400 (K/uL)   BASIC METABOLIC PANEL  Status: Abnormal   Collection Time   12/06/11  3:18 PM      Component Value Range Comment   Sodium 136  135 - 145 (mEq/L)    Potassium 4.1  3.5 - 5.1 (mEq/L)    Chloride 100  96 - 112 (mEq/L)    CO2 26  19 - 32 (mEq/L)    Glucose, Bld 102 (*) 70 - 99 (mg/dL)    BUN 20  6 - 23 (mg/dL)    Creatinine, Ser 0.86  0.50 - 1.35 (mg/dL)    Calcium 9.8  8.4 - 10.5 (mg/dL)    GFR calc non Af Amer 89 (*) >90 (mL/min)    GFR calc Af Amer >90  >90 (mL/min)   BODY FLUID CULTURE     Status: Normal (Preliminary result)   Collection Time   12/06/11  7:06 PM      Component Value Range Comment   Specimen Description KNEE SYNOVIAL RIGHT      Special Requests NONE      Gram Stain        Value: RARE WBC PRESENT, PREDOMINANTLY PMN     NO ORGANISMS SEEN     Gram Stain Report Called to,Read Back By and Verified With: Gram Stain Report Called to,Read Back By and Verified With: [facility] C 2019 12/06/11 BY LOVE T  Performed by [facility]   Culture NO GROWTH      Report  Status PENDING     GRAM STAIN     Status: Normal   Collection Time   12/06/11  7:06 PM      Component Value Range Comment   Specimen Description KNEE RIGHT      Special Requests NONE      Gram Stain        Value: NO ORGANISMS SEEN     RARE WBC PRESENT, PREDOMINANTLY PMN     Gram Stain Report Called to,Read Back By and Verified With: [facility] AT 2019 ON [id number] BY LOVE,T.   Report Status 12/06/2011 FINAL     CBC     Status: Abnormal   Collection Time   12/07/11  3:55 AM      Component Value Range Comment   WBC 13.0 (*) 4.0 - 10.5 (K/uL)    RBC 4.64  4.22 - 5.81 (MIL/uL)    Hemoglobin 13.3  13.0 - 17.0 (g/dL)    HCT 62.9  52.8 - 41.3 (%)    MCV 89.4  78.0 - 100.0 (fL)    MCH 28.7  26.0 - 34.0 (pg)    MCHC 32.0  30.0 - 36.0 (g/dL)    RDW 24.4  01.0 - 27.2 (%)    Platelets 665 (*) 150 - 400 (K/uL)   BASIC METABOLIC PANEL     Status: Normal   Collection Time   12/07/11  3:55 AM      Component Value Range Comment   Sodium 135  135 - 145 (mEq/L)    Potassium 4.9  3.5 - 5.1 (mEq/L) SLIGHT HEMOLYSIS   Chloride 100  96 - 112 (mEq/L)    CO2 28  19 - 32 (mEq/L)    Glucose, Bld 98  70 - 99 (mg/dL)    BUN 17  6 - 23 (mg/dL)    Creatinine, Ser 5.36  0.50 - 1.35 (mg/dL)    Calcium 8.9  8.4 - 10.5 (mg/dL)    GFR calc non Af Amer >90  >90 (mL/min)    GFR calc Af Amer >90  >  90 (mL/min)     MICRO:  IMAGING: No results found.  HISTORICAL MICRO/IMAGING  Assessment/Plan:  PJI 1) PJI - he will be started on ampicillin monotherapy, combination with gentamicin for PJI is not indicated.  He will get a 6 week course of IV ampicillin at 2 grams q 6 hours.  He will need home health ([facility] preferred) and weekly CRP, ESR, CBC, CMP sent to [facility].  He will follow up with [place] in 6 weeks to evaluate if he needs an extension of therapy or not.    Thank you for the consult.

## 2011-12-07 NOTE — Progress Notes (Signed)
Physical Therapy Treatment Patient Details Name: Jordan Vazquez MRN: [id number] DOB: 1946/08/03 Today's Date: 12/07/2011  13:10-13:34 G, TA PT Assessment/Plan  PT - Assessment/Plan Comments on Treatment Session: Doing well with mobility. Expect excellent progress.Stair training tomorrow. PT Plan: Discharge plan remains appropriate PT Frequency: 7X/week Follow Up Recommendations: Home health PT Equipment Recommended: None recommended by PT PT Goals  Acute Rehab PT Goals PT Goal Formulation: With patient Time For Goal Achievement: 5 days Pt will go Supine/Side to Sit: with modified independence PT Goal: Supine/Side to Sit - Progress: Progressing toward goal Pt will go Sit to Stand: with modified independence;with upper extremity assist PT Goal: Sit to Stand - Progress: Progressing toward goal Pt will Ambulate: >150 feet;with modified independence;with rolling walker PT Goal: Ambulate - Progress: Progressing toward goal Pt will Go Up / Down Stairs: 3-5 stairs;with rolling walker;with min assist PT Goal: Up/Down Stairs - Progress: Not met Pt will Perform Home Exercise Program: with min assist PT Goal: Perform Home Exercise Program - Progress: Progressing toward goal  PT Treatment Precautions/Restrictions  Precautions Precautions: Knee Required Braces or Orthoses: Yes Knee Immobilizer: Discontinue once straight leg raise with < 10 degree lag Restrictions Weight Bearing Restrictions: No Mobility (including Balance) Bed Mobility Bed Mobility: Yes Supine to Sit: 4: Min assist;With rails;HOB flat Supine to Sit Details (indicate cue type and reason): min A for RLE Transfers Transfers: Yes Sit to Stand: 5: Supervision;From bed;With upper extremity assist Sit to Stand Details (indicate cue type and reason): VCs hand placement Stand to Sit: 5: Supervision;To chair/3-in-1;With armrests;With upper extremity assist Stand to Sit Details: min A to control descent, VCs hand  placement Ambulation/Gait Ambulation/Gait: Yes Ambulation/Gait Assistance: 5: Supervision Ambulation/Gait Assistance Details (indicate cue type and reason): supervision 2* pt reported mild dizziness Ambulation Distance (Feet): 130 Feet Assistive device: Rolling walker Gait Pattern: Step-to pattern    Exercise  Total Joint Exercises Ankle Circles/Pumps: AROM;Right;10 reps;Supine Quad Sets: AROM;Right;10 reps;Supine Short Arc Quad: AAROM;Right;5 reps;Supine Heel Slides: AAROM;Right;10 reps;Supine Straight Leg Raises: AAROM;5 reps;Right End of Session PT - End of Session Equipment Utilized During Treatment: Gait belt Activity Tolerance: Patient tolerated treatment well;Patient limited by pain Patient left: in chair;with call bell in reach Nurse Communication: Mobility status for transfers;Mobility status for ambulation General Behavior During Session: [facility] for tasks performed Cognition: [facility] for tasks performed  Tamala Ser 12/07/2011, 1:49 PM Tamala Ser PT 12/07/2011  [phone]

## 2011-12-07 NOTE — Op Note (Signed)
NAME:  Jordan Vazquez, Jordan Vazquez NO.:  [id number]  MEDICAL RECORD NO.:  [id number]  LOCATION:  1618                         FACILITY:  [facility]  PHYSICIAN:  Madlyn Frankel. Charlann Boxer, M.D.  DATE OF BIRTH:  1946-02-14  DATE OF PROCEDURE:  12/06/2011 DATE OF DISCHARGE:                              OPERATIVE REPORT   PREOPERATIVE DIAGNOSIS:  Infected right total knee.  POSTOPERATIVE DIAGNOSIS:  Infected right total knee.  PROCEDURE:  Incisional debridement of right knee with polyethylene exchange and placement of antibiotic laden beads, Stimulan.  The polyethylene exchange was a size 6 x 12.5 insert.  SURGEON:  Madlyn Frankel. Charlann Boxer, MD  ASSISTANT:  Erasmo Leventhal, MD and Lanney Gins, PA-C  Please note that both Dr. [name] and Lanney Gins, PA-C,  were present for the entirety of the case from preoperative positioning to perioperative retractor management, synovectomy assistance, primary wound closure, and general facilitation of the case in all stages.  ANESTHESIA:  General.  FINDINGS:  The patient was noted to have a seromatous type fusion,which was noted to be about 10-15 cc.  The tissue within the knee appeared to be a reactive synovitis.  No obvious purulence, no loculated fluid collections nor abscesses.  The components were otherwise stable.  SPECIMENS:  Joint fluid was swabbed and sent to Microbiology for Gram stain, culture, aerobic and anaerobic.  Preoperative aspirations in the office was already sent to the lab, revealing concern for a enterococcus finding by culture.  TOURNIQUET TIME:  62 minutes at 250 mmHg.  DRAINS:  One medium Hemovac.  INDICATION FOR THE PROCEDURE:  Mr. Bednarczyk is a 66 year old gentleman, patient of Dr. Valma Cava, who is status post a right total knee replacement approximately 3 months ago.  He has also had his left knee replaced.  He had noted in their office that he recovered from his left knee easier than his right.  He had  some recurring swelling in his right knee worse with physical therapy.  Dr. [name] in his office had aspirated his knee upon one presentation with findings of a rare gram positive.  Given these findings, it was recommended that he undergo at least at this point a significant I and D and poly exchange for an attempted salvage of this joint given the relative proximity to his index procedure.  The risks of this were discussed, in the setting of fighting infection versus a resection arthroplasty and placement of antibiotic prosthetic component followed by revision reimplantation.  After discussing the risks and benefits and general odds, I felt that this was a very good option for him given the findings of a strep infection with a possible less virulent strain.  The consent was obtained for management of infection.  PROCEDURE IN DETAIL:  The patient was brought to the operative theater. Once adequate anesthesia was established, the patient was positioned supine with the right thigh tourniquet placed.  The right lower extremity was then prepped and draped in sterile fashion.  with the right leg placed in [facility] leg holder.  A time-out was performed identifying the patient, planned procedure, and extremity.  The leg was exsanguinated, tourniquet elevated to 250 mmHg.  A portion of his previous incision was utilized.  A sharp dissection was carried to the extensor mechanism and soft tissue planes created.  Median arthrotomy was made encountering the a blood-tinged synovial fluid.  Cultures were obtained.  At this point, the vast majority of the procedure was carried out at the incisional debridement.  Once exposure of the knee was obtained with a proximal medial peel the medial gutter was debrided of all synovium followed by the suprapatellar pouch and in the lateral gutter.  Once this was debrided including the infra and suprapatellar regions, the polyethylene insert was removed,  taking care to preserve the femoral and tibial components.  Further debridement was carried out on the posterior aspect of the knee at this point.  At this point, the knee was irrigated with 9 L of normal saline solution using a pulse lavage.  Once we had irrigated the knee out, the final insert was chosen and placed into the knee.  As this procedure was initiated on the back table two, since 10 cc batches of Stimulan were mixed, each 10 cc with 1 g of vancomycin and 1.2 g of tobramycin.  They were mixed on the back table and placed into the trays to create antibiotic beads.  At the time of the incisional debridement, an irrigation had been carried out, the beads were prepared and ready.  They were then placed into the medial and lateral gutters as well as suprapatellar pouch.  A medium Hemovac drain was placed deep. The extensor mechanism was then reapproximated with the knee in extension using #1 PDS sutures as a monofilament.  The remainder of the wound was closed with 2-0 Vicryl and staples on the skin.  The skin was cleaned, dried, and dressed sterilely using Xeroform and bulky sterile dressing.  The patient was brought to the recovery room in stable condition, extubated, tolerating the procedure well.  Tourniquet was let down after 60 minutes.  The patient will be in the hospital, will receive a PICC line. Infectious Disease will be consulted tomorrow to evaluate and assess best antibiotics based on cultures, most likely from the aspiration in the office which are available through the lab.  He will be permitted to participate in physical therapy,  weightbearing as tolerated, working on range of motion.  The Stimulan beads that are in place in the knee will dissolve over time, but certainly elute antibiotics into the knee area as the bacterial side rolls in.     Madlyn Frankel Charlann Boxer, M.D.     MDO/MEDQ  D:  12/06/2011  T:  12/07/2011  Job:  [id number]

## 2011-12-07 NOTE — Evaluation (Signed)
Physical Therapy Evaluation Patient Details Name: Jordan Vazquez MRN: [id number] DOB: 26-Dec-1945 Today's Date: 12/07/2011 PT Eval 2 10:10-10:35  Problem List:  Patient Active Problem List  Diagnoses  . Infection of right prosthetic knee joint    Past Medical History:  Past Medical History  Diagnosis Date  . Hypertension   . 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 Hernia repair     right inguinal hernia surgery   . Joint replacement     left knee 5/12, right TKA 10/12   . Cardiac catheterization     16 years ago negative     PT Assessment/Plan/Recommendation PT Assessment Clinical Impression Statement: Pt doing well for POD #1, expect good progress. Will benefit from PT for gait training, and R knee ROM/strengthening exercises. PT Recommendation/Assessment: Patient will need skilled PT in the acute care venue PT Problem List: Decreased strength;Decreased activity tolerance;Decreased range of motion;Decreased mobility;Decreased knowledge of use of DME;Pain Barriers to Discharge: None PT Therapy Diagnosis : Acute pain;Difficulty walking PT Plan PT Frequency: 7X/week PT Treatment/Interventions: DME instruction;Gait training;Stair training;Therapeutic activities;Therapeutic exercise;Patient/family education PT Recommendation Follow Up Recommendations: Home health PT Equipment Recommended: None recommended by PT PT Goals  Acute Rehab PT Goals PT Goal Formulation: With patient Time For Goal Achievement: 5 days Pt will go Supine/Side to Sit: with modified independence PT Goal: Supine/Side to Sit - Progress: Goal set today Pt will go Sit to Stand: with modified independence;with upper extremity assist PT Goal: Sit to Stand - Progress: Goal set today Pt will Ambulate: >150 feet;with modified independence;with rolling walker PT Goal: Ambulate - Progress: Goal set today Pt will Go Up / Down  Stairs: 3-5 stairs;with rolling walker;with min assist PT Goal: Up/Down Stairs - Progress: Goal set today Pt will Perform Home Exercise Program: with min assist PT Goal: Perform Home Exercise Program - Progress: Goal set today  PT Evaluation Precautions/Restrictions  Precautions Precautions: Knee Required Braces or Orthoses: Yes Knee Immobilizer: Discontinue once straight leg raise with < 10 degree lag Restrictions Weight Bearing Restrictions: No Prior Functioning  Home Living Lives With: Spouse Receives Help From: Family Type of Home: Apartment Home Layout: One level Home Access: Stairs to enter Entrance Stairs-Rails: Right Entrance Stairs-Number of Steps: 36 Home Adaptive Equipment: Walker - rolling Prior Function Level of Independence: Independent with basic ADLs;Independent with homemaking with ambulation;Independent with gait;Independent with transfers Able to Take Stairs?: Yes Vocation: Unemployed Vocation Requirements: Engineer, water Cognition Arousal/Alertness: Awake/alert Overall Cognitive Status: Appears within functional limits for tasks assessed Orientation Level: Oriented X4 Sensation/Coordination Sensation Light Touch: Appears Intact Coordination Gross Motor Movements are Fluid and Coordinated: Yes Fine Motor Movements are Fluid and Coordinated: Yes Extremity Assessment RUE Assessment RUE Assessment: Within Functional Limits LUE Assessment LUE Assessment: Within Functional Limits RLE Assessment RLE Assessment: Exceptions to [facility] RLE AROM (degrees) Right Knee Flexion 0-140: 35  RLE Strength Right Hip Flexion: 2+/5 LLE Assessment LLE Assessment: Within Functional Limits Mobility (including Balance) Bed Mobility Bed Mobility: Yes Supine to Sit: 4: Min assist;With rails;HOB flat Supine to Sit Details (indicate cue type and reason): pt 75%, assist for RLE Transfers Transfers: Yes Sit to Stand: 4: Min assist;From bed;With upper extremity  assist Sit to Stand Details (indicate cue type and reason): VCs hand placement Stand to Sit: 4: Min assist;To chair/3-in-1;With armrests;With upper extremity assist Stand to Sit Details: min A to control descent, VCs hand  placement Ambulation/Gait Ambulation/Gait: Yes Ambulation/Gait Assistance: 4: Min assist Ambulation/Gait Assistance Details (indicate cue type and reason): min A for balance, steering RW Ambulation Distance (Feet): 5 Feet (distance limited by PT 2* KI had not yet been delivered ) Assistive device: Rolling walker Gait Pattern: Step-to pattern    Exercise  Total Joint Exercises Ankle Circles/Pumps: AROM;Right;10 reps;Supine Quad Sets: AROM;Right;10 reps;Supine Short Arc Quad: AAROM;Right;5 reps;Supine Heel Slides: AAROM;Right;10 reps;Supine Straight Leg Raises: AAROM;5 reps;Right End of Session PT - End of Session Equipment Utilized During Treatment: Gait belt Activity Tolerance: Patient tolerated treatment well;Patient limited by pain Patient left: in chair;with call bell in reach Nurse Communication: Mobility status for transfers;Mobility status for ambulation General Behavior During Session: [facility] for tasks performed Cognition: [facility] for tasks performed  Tamala Ser 12/07/2011, 1:08 PM  Tamala Ser PT 12/07/2011  [phone]

## 2011-12-07 NOTE — Clinical Documentation Improvement (Signed)
EXCISIONAL DEBRIDEMENT DOCUMENTATION CLARIFICATION  THIS DOCUMENT IS NOT A PERMANENT PART OF THE MEDICAL RECORD  TO RESPOND TO THE THIS QUERY, FOLLOW THE INSTRUCTIONS BELOW:  1. If needed, update documentation for the patient's encounter via the notes activity.  2. Access this query again and click edit on the In [employer].  3. After updating, or not, click F2 to complete all highlighted (required) fields concerning your review. Select "additional documentation in the medical record" OR "no additional documentation provided".  4. Click Sign note button.  5. The deficiency will fall out of your In Basket *Please let us know if you are not able to complete this workflow by phone or e-mail (listed below).  Please update your documentation within the medical record to reflect your response to this query.                                                                                        12/07/11   Dear Dr. [facility],  In a better effort to capture your patient's severity of illness, reflect appropriate length of stay and utilization of resources, a review of the patient medical record has revealed the following indicators.    Based on your clinical judgment, please clarify and document in a progress note and/or discharge summary the clinical condition associated with the following supporting information:  In responding to this query please exercise your independent judgment.  The fact that a query is asked, does not imply that any particular answer is desired or expected. Because that is documentation in the medical record of "debridement" clarification is needed.  According to op note on 12/07/11 pt had Incisional debridement of right knee. Per the op note pt with "a sharp dissection"   Please clarify the following for accuracy of SOI/ROM   Whether or not Incision debridement can be further specified as one of the debridements listed below (see list below)    Please note  the type of instrument used during the procedure.    Please read over options listed below  Thank you for all that you do for our patients!     Please document the below (4) key elements in a progress note:  1.  Type of Debridement:          Excisional Debridement-  Cutting away necrotic, devitalized tissue or slough to  Level of viable tissue using a sharp instrument (example, scalpel, scissors, etc).           NON Excisional Debridement-  The removal of necrotic, devitalized tissue or slough by means of scraping, mechanical brushing, flushing, or washing. (example: irrigation, whirlpool);  Minor removal of loose fragments  2.  Please indicate instrument used:  Scissors, Scalpel, Curette, or other  3.  Please document depth of the debridement:             Partial Thickness  Full Thickness  Skin and Subcutaneous Tissue  Subcutaneous Tissue and Muscle  Subcutaneous Tissue, Muscle and Bone  Other  4.  Document the size of the debridement.  You may use possible, probable, or suspect with inpatient documentation. possible, probable, suspected diagnoses MUST be documented  at the time of discharge  Supporting documentation: Op note on 12/07/11: Incisional debridement of right knee  A sharp dissection was carried to the extensor mechanism and soft tissue planes created.  Median arthrotomy was made encountering the a blood-tinged synovial fluid. Cultures were obtained.   At this point, the vast majority of the procedure was carried out at the incisional debridement.  Once exposure of the knee was obtained with a proximal medial peel the medial gutter was debrided of all synovium followed by the suprapatellar pouch and in the lateral gutter.  Once this was debrided including the infra and suprapatellar regions, the polyethylene insert was removed, taking care to preserve the femoral and tibial components.   Reviewed:  no additional documentation provided 12/08/11  ljh  12/08/2011: Waiting on MD response. [facility]   Thank You,  Enis Slipper  RN, BSN, CCDS Clinical Documentation Specialist [facility] HIM Dept Pager: [phone] / E-mail: [email or web address] Health Information Management [place]

## 2011-12-08 ENCOUNTER — Encounter (HOSPITAL_COMMUNITY): Payer: Self-pay | Admitting: Orthopedic Surgery

## 2011-12-08 ENCOUNTER — Encounter: Payer: Medicare Other | Admitting: Rehabilitative and Restorative Service Providers"

## 2011-12-08 LAB — CBC
HCT: 38.5 % — ABNORMAL LOW (ref 39.0–52.0)
Hemoglobin: 12.9 g/dL — ABNORMAL LOW (ref 13.0–17.0)
MCH: 29.3 pg (ref 26.0–34.0)
MCHC: 33.5 g/dL (ref 30.0–36.0)
RDW: 14.7 % (ref 11.5–15.5)

## 2011-12-08 LAB — BASIC METABOLIC PANEL
BUN: 13 mg/dL (ref 6–23)
Calcium: 10 mg/dL (ref 8.4–10.5)
Creatinine, Ser: 0.85 mg/dL (ref 0.50–1.35)
GFR calc Af Amer: >90 mL/min (ref >90)
GFR calc non Af Amer: 89 mL/min — ABNORMAL LOW (ref >90)
Glucose, Bld: 103 mg/dL — ABNORMAL HIGH (ref 70–99)
Potassium: 3.5 mEq/L (ref 3.5–5.1)

## 2011-12-08 MED ORDER — DSS 100 MG PO CAPS: 100.0000 mg | ORAL_CAPSULE | Freq: Two times a day (BID) | ORAL | Status: AC

## 2011-12-08 MED ORDER — POLYETHYLENE GLYCOL 3350 17 G PO PACK: 17.0000 g | PACK | Freq: Two times a day (BID) | ORAL | Status: AC

## 2011-12-08 MED ORDER — HYDROCODONE-ACETAMINOPHEN 7.5-325 MG PO TABS: 1.0000 | ORAL_TABLET | ORAL | Status: AC

## 2011-12-08 MED ORDER — HEPARIN SOD (PORK) LOCK FLUSH 100 UNIT/ML IV SOLN
500.0000 [IU] | Freq: Once | INTRAVENOUS | Status: AC
  Administered 2011-12-08: 250 [IU] via INTRAVENOUS

## 2011-12-08 MED ORDER — ASPIRIN EC 325 MG PO TBEC: 325.0000 mg | DELAYED_RELEASE_TABLET | Freq: Two times a day (BID) | ORAL | Status: AC

## 2011-12-08 MED ORDER — SODIUM CHLORIDE 0.9 % IV SOLN: 2.0000 g | Freq: Four times a day (QID) | INTRAVENOUS | Status: DC

## 2011-12-08 MED ORDER — DIPHENHYDRAMINE HCL 25 MG PO CAPS: 25.0000 mg | ORAL_CAPSULE | Freq: Four times a day (QID) | ORAL | Status: AC | PRN

## 2011-12-08 MED ORDER — METHOCARBAMOL 500 MG PO TABS: 500.0000 mg | ORAL_TABLET | Freq: Four times a day (QID) | ORAL | Status: AC | PRN

## 2011-12-08 MED ORDER — FERROUS SULFATE 325 (65 FE) MG PO TABS: 325.0000 mg | ORAL_TABLET | Freq: Three times a day (TID) | ORAL | Status: DC

## 2011-12-08 NOTE — Progress Notes (Signed)
Physical Therapy Treatment Patient Details Name: ALPHEUS STIFF MRN: [id number] DOB: [birth year] Today's Date: 12/08/2011  R s/p TKR I&D and poly exchange POD #2 9:35 - 9:55 PT Assessment/Plan  PT - Assessment/Plan Comments on Treatment Session: Ready for D/C to home. PT Plan: Discharge plan remains appropriate Follow Up Recommendations: Home health PT Equipment Recommended: None recommended by PT PT Goals  Acute Rehab PT Goals PT Goal Formulation: With patient Pt will go Supine/Side to Sit: with modified independence PT Goal: Supine/Side to Sit - Progress: Met Pt will go Sit to Stand: with modified independence PT Goal: Sit to Stand - Progress: Met Pt will Ambulate: >150 feet;with modified independence;with rolling walker PT Goal: Ambulate - Progress: Met Pt will Go Up / Down Stairs: 3-5 stairs;with min assist;with rolling walker PT Goal: Up/Down Stairs - Progress: Other (comment) (Pt declined to attempt stating he was fine) Pt will Perform Home Exercise Program: with min assist PT Goal: Perform Home Exercise Program - Progress: Met  PT Treatment Precautions/Restrictions  Precautions Precautions: Knee Precaution Comments: Pt instructed on KI use for amb Required Braces or Orthoses: Yes Knee Immobilizer: Discontinue once straight leg raise with < 10 degree lag Restrictions Weight Bearing Restrictions: No RLE Weight Bearing: Weight bearing as tolerated Mobility (including Balance) Bed Mobility Bed Mobility: Yes Supine to Sit: 6: Modified independent (Device/Increase time) Transfers Transfers: Yes Sit to Stand: 6: Modified independent (Device/Increase time);From bed Stand to Sit: 6: Modified independent (Device/Increase time);To chair/3-in-1 Ambulation/Gait Ambulation/Gait: Yes Ambulation/Gait Assistance: 6: Modified independent (Device/Increase time) Ambulation/Gait Assistance Details (indicate cue type and reason): with KI  Ambulation Distance (Feet): 285 Feet Assistive  device: Rolling walker Gait Pattern: Step-to pattern Stairs: No (Pt stated he knows how to negociate the stairs from prior su) Wheelchair Mobility Wheelchair Mobility: No    Exercise    End of Session PT - End of Session Equipment Utilized During Treatment: Gait belt Activity Tolerance: Patient tolerated treatment well Patient left: with call bell in reach;in chair Nurse Communication: Other (comment) (Pt very eager to D/C to home today) General Behavior During Session: [facility] for tasks performed Cognition: [facility] for tasks performed  Felecia Shelling  PTA [facility]  Acute  Rehab Pager     [phone]

## 2011-12-08 NOTE — Progress Notes (Signed)
  CARE MANAGEMENT NOTE 12/08/2011  Patient:  Jordan Vazquez, Jordan Vazquez   Account Number:  [id number]  Date Initiated:  12/08/2011  Documentation initiated by:  Colleen Can  Subjective/Objective Assessment:   DX RT KNEE INFECTION; S/P RT KNEE REPLACEMNT 3 MONTHS AGO     Action/Plan:   CM SPOKE WITH PATIENT. Patient plans to go back to home where spouse will be caregiver. Jordan Vazquez is aready active with patient and he wishes to continue their services . Pt will require home iv abx   Anticipated DC Date:  12/09/2011   Anticipated DC Plan:  HOME W HOME HEALTH SERVICES  In-house referral  NA      DC Planning Services  CM consult      [facility] Choice  HOME HEALTH   Choice offered to / List presented to:  C-1 Patient   DME arranged  NA      DME agency  NA     HH arranged  HH-2 PT  HH-1 RN      [facility] agency  [facility]   Status of service:  Completed, signed off Medicare Important Message given?  NO (If response is "NO", the following Medicare IM given date fields will be blank)  Discharge Disposition:  HOME W HOME HEALTH SERVICES  Comments:  12/08/2011 Raynelle Bring BSN CCM [phone] Jordan Vazquez received authorization from [facility] for IV home antibiotic infusion. Pt has picc line in place. Pt to receive Home IV abx q6hrs x 6 weeks. Prescription for med has been written by ID doctor. Jordan Vazquez to start services today for home infusion

## 2011-12-08 NOTE — Progress Notes (Signed)
Subjective: 2 Days Post-Op Procedure(s) (LRB): IRRIGATION AND DEBRIDEMENT KNEE WITH POLY EXCHANGE (Right)   Patient reports pain as mild. No events. Ready to be discharged home.  Objective:   VITALS:   Filed Vitals:   12/08/11 0610  BP: 113/68  Pulse: 74  Temp: 98.7 F (37.1 C)  Resp: 18    Neurovascular intact Dorsiflexion/Plantar flexion intact Incision: dressing C/D/I No cellulitis present Compartment soft  LABS  Basename 12/08/11 0618 12/07/11 0355 12/06/11 1518  HGB 12.9* 13.3 14.5  HCT 38.5* 41.5 43.3  WBC 11.2* 13.0* 13.8*  PLT 561* 665* 723*     Basename 12/08/11 0618 12/07/11 0355 12/06/11 1518  NA 133* 135 136  K 3.5 4.9 4.1  BUN 13 17 20   CREATININE 0.85 0.81 0.85  GLUCOSE 103* 98 102*     Assessment/Plan: 2 Days Post-Op Procedure(s) (LRB): IRRIGATION AND DEBRIDEMENT KNEE WITH POLY EXCHANGE (Right)   Up with therapy Discharge home with home health after PT Orders placed for [facility] PT and RN. Ampicillin 2 grams q 6 hours, per Infectious Disease orders with weekly CRP, ESR, CBC and CMP sent to [facility].  Follow up at [facility] in 6 weeks Follow up at [facility] in 2 weeks.   Jordan Vazquez   PAC  12/08/2011, 10:15 AM

## 2011-12-08 NOTE — Progress Notes (Signed)
  CARE MANAGEMENT NOTE 12/08/2011  Patient:  Jordan Vazquez, Jordan Vazquez   Account Number:  [id number]  Date Initiated:  12/08/2011  Documentation initiated by:  Colleen Can  Subjective/Objective Assessment:   DX RT KNEE INFECTION; S/P RT KNEE REPLACEMNT 3 MONTHS AGO     Action/Plan:   CM SPOKE WITH PATIENT. Patient plans to go back to home where spouse will be caregiver. [facility] is aready active with patient and he wishes to continue their services . Pt will require home iv abx   Anticipated DC Date:  12/09/2011   Anticipated DC Plan:  HOME W HOME HEALTH SERVICES  In-house referral  NA      DC Planning Services  CM consult      [facility] Choice  HOME HEALTH   Choice offered to / List presented to:  C-1 Patient   DME arranged  NA      DME agency  NA     HH arranged  HH-2 PT  HH-1 RN      [facility] agency  [facility]   Status of service:  In process, will continue to follow  Comments:  12/08/2011 Raynelle Bring BSN CCM [phone] [facility] will arrange for home IV abx via [employer]. They will service with hhpt, rn. [facility] advised that [facility] does require 24hr notice for authorization for home iv meds. List of HH agencies placed in shadow chart. CM will follow

## 2011-12-10 LAB — BODY FLUID CULTURE

## 2011-12-13 NOTE — Discharge Summary (Signed)
Physician Discharge Summary  Patient ID: Jordan Vazquez MRN: [id number] DOB/AGE: 11-21-1945 66 y.o.  Admit date: 12/06/2011 Discharge date: 12/08/2011  Procedures:  Procedure(s) (LRB): IRRIGATION AND DEBRIDEMENT KNEE WITH POLY EXCHANGE (Right)  Attending Physician: Dr. Durene Romans  Admission Diagnoses: Right knee infection/inflammation reaction to joint prosthesis   Discharge Diagnoses:  Principal Problem:  *Infection of right prosthetic knee joint HTN Arthritis  HPI: Pt is a 66 y.o. male complaining of swelling and some pain for a couple of weeks, status post right total knee arthroplasty 3 months ago. Swelling and pain has been increasing slightly for a couple of weeks. Pt presented to the clinic with his symptoms and where his knee was aspirated and strep grew out. Various options are discussed with the patient. Risks, benefits and expectations were discussed with the patient. Patient understand the risks, benefits and expectations and wishes to proceed with removal of the poly with placement of antibiotic implants.   PCP: Lillia Mountain, MD, MD   Discharged Condition: good  Hospital Course:  Patient underwent the above stated procedure on 12/06/2011. Patient tolerated the procedure well and brought to the recovery room in good condition and subsequently to the floor.  POD #1 BP: 109/72 ; Pulse: 83 ; Temp: 98.1 F (36.7 C) ; Resp: 16  Pt's foley was removed, as well as the hemovac drain removed. IV was changed to a saline lock. Patient reports pain as mild. No events. Feels much better today than after his last surgery. Neurovascular intact, dorsiflexion/plantar flexion intact, incision: dressing C/D/I, no cellulitis present and compartment soft.  LABS  Basename  12/07/11 0355    HGB  13.3    HCT  41.5     POD #2  BP: 113/68 ; Pulse: 74 ; Temp: 98.7 F (37.1 C) ; Resp: 18  Patient reports pain as mild. No events. PICC line placed, and ID consult conducted and treatment  was determined.  Ready to be discharged home.  LABS  Basename  12/08/11 0618     HGB  12.9*     HCT  38.5*       Discharge Exam: Extremities: Homans sign is negative, no sign of DVT, no edema, redness or tenderness in the calves or thighs and no ulcers, gangrene or trophic changes  Disposition: Home-Health Care Svc     with follow up in 2 weeks  Follow-up Information    Follow up with OLIN,Defne Gerling D in 2 weeks.   Contact information:   [facility] [address] [place] [address] [phone]          Discharge Orders    Future Orders Please Complete By Expires   Call MD / Call 911      Comments:   If you experience chest pain or shortness of breath, CALL 911 and be transported to the hospital emergency room.  If you develope a fever above 101 F, pus (white drainage) or increased drainage or redness at the wound, or calf pain, call your surgeon's office.   Constipation Prevention      Comments:   Drink plenty of fluids.  Prune juice may be helpful.  You may use a stool softener, such as Colace (over the counter) 100 mg twice a day.  Use MiraLax (over the counter) for constipation as needed.   Increase activity slowly as tolerated      Weight Bearing as taught in Physical Therapy      Comments:   Use a  walker or crutches as instructed.   Driving restrictions      Comments:   No driving for 4 weeks   TED hose      Comments:   Use stockings (TED hose) for 2 weeks on both leg(s).  You may remove them at night for sleeping.   Change dressing      Comments:   Change the dressing daily with sterile 4 x 4 inch gauze dressing and tape. Keep the area dry and clean.   Discharge instructions      Comments:   Change the dressing daily with gauze and tape. Keep the area dry and clean until follow up. Follow up in 2 weeks at [facility]. Call with any questions or concerns.  Home Health to follow orders for Infectious Disease  for labs and infusion.        Discharge Medication List as of 12/08/2011 11:45 AM    START taking these medications   Details  diphenhydrAMINE (BENADRYL) 25 mg capsule Take 1 capsule (25 mg total) by mouth every 6 (six) hours as needed for itching, allergies or sleep., Starting 12/08/2011, Until Sat 12/18/11, No Print    docusate sodium 100 MG CAPS Take 100 mg by mouth 2 (two) times daily., Starting 12/08/2011, Until Sat 12/18/11, No Print    ferrous sulfate 325 (65 FE) MG tablet Take 1 tablet (325 mg total) by mouth 3 (three) times daily after meals., Starting 12/08/2011, Until Thu 12/07/12, No Print    HYDROcodone-acetaminophen (NORCO) 7.5-325 MG per tablet Take 1-2 tablets by mouth every 4 (four) hours., Starting 12/08/2011, Until Sat 12/18/11, Print    polyethylene glycol (MIRALAX / GLYCOLAX) packet Take 17 g by mouth 2 (two) times daily., Starting 12/08/2011, Until Sat 12/11/11, No Print    sodium chloride 0.9 % SOLN 50 mL with ampicillin 2 G SOLR 2 g Inject 2 g into the vein every 6 (six) hours., Starting 12/08/2011, Until Discontinued, Print      CONTINUE these medications which have CHANGED   Details  aspirin EC 325 MG tablet Take 1 tablet (325 mg total) by mouth 2 (two) times daily. X 4 weeks, Starting 12/08/2011, Until Sat 12/18/11, No Print    methocarbamol (ROBAXIN) 500 MG tablet Take 1 tablet (500 mg total) by mouth every 6 (six) hours as needed (muscle spasms)., Starting 12/08/2011, Until Sat 12/18/11, No Print      CONTINUE these medications which have NOT CHANGED   Details  atenolol-chlorthalidone (TENORETIC) 50-25 MG per tablet Take 1 tablet by mouth every morning., Until Discontinued, Historical Med    atorvastatin (LIPITOR) 40 MG tablet Take 40 mg by mouth every morning., Until Discontinued, Historical Med    cholecalciferol (VITAMIN D) 1000 UNITS tablet Take 1,000 Units by mouth daily., Until Discontinued, Historical Med    Multiple Vitamin (MULITIVITAMIN WITH MINERALS) TABS Take  1 tablet by mouth daily., Until Discontinued, Historical Med    Omega-3 Fatty Acids (SEA-OMEGA 30 PO) Take 1 capsule by mouth daily., Until Discontinued, Historical Med      STOP taking these medications     amoxicillin (AMOXIL) 875 MG tablet Comments:  Reason for Stopping:       diclofenac (VOLTAREN) 75 MG EC tablet Comments:  Reason for Stopping:       HYDROcodone-acetaminophen (NORCO) 5-325 MG per tablet Comments:  Reason for Stopping:            Signed: Anastasio Auerbach. Leeya Rusconi   PAC  12/13/2011, 4:44 PM

## 2011-12-20 ENCOUNTER — Telehealth: Payer: Self-pay | Admitting: *Deleted

## 2011-12-20 NOTE — Telephone Encounter (Signed)
Noted. Thanks.

## 2011-12-20 NOTE — Telephone Encounter (Signed)
Marylene Land, an RN with [facility] called [phone]) to report that the iv bag leaked over the weekend & pt missed 3 doses. Wanted to make sure md was made aware

## 2012-01-13 ENCOUNTER — Ambulatory Visit (INDEPENDENT_AMBULATORY_CARE_PROVIDER_SITE_OTHER): Payer: Medicare Other | Admitting: Internal Medicine

## 2012-01-13 ENCOUNTER — Encounter: Payer: Self-pay | Admitting: Internal Medicine

## 2012-01-13 VITALS — BP 143/76 | HR 67 | Temp 97.7°F | Ht 72.0 in | Wt 205.0 lb

## 2012-01-13 DIAGNOSIS — T8459XA Infection and inflammatory reaction due to other internal joint prosthesis, initial encounter: Secondary | ICD-10-CM

## 2012-01-13 DIAGNOSIS — T8450XA Infection and inflammatory reaction due to unspecified internal joint prosthesis, initial encounter: Secondary | ICD-10-CM

## 2012-01-13 DIAGNOSIS — Z96659 Presence of unspecified artificial knee joint: Secondary | ICD-10-CM

## 2012-01-13 MED ORDER — AMOXICILLIN 500 MG PO TABS: 500.0000 mg | ORAL_TABLET | Freq: Two times a day (BID) | ORAL | Status: DC

## 2012-01-13 NOTE — Progress Notes (Signed)
  Subjective:    Patient ID: Jordan Vazquez, male    DOB: 01/24/1946, 66 y.o.   MRN: [id number]  HPI here for follow up of a right PJI of knee.  He has had TKA bilaterally and 3 months following implantation of the right developed pain and swelling.  Analysis revealed pus like material and culture grew ampicillin sensitive Enterococcus.  He was then placed on ampicillin by pump and is now about to finish 6 weeks of IV therapy.  He reports no problems with the medication, his knee is doing very well by his estimation and has had no fever or chills.  His last ESR was 1 and crp 0.28.      Review of Systems  Constitutional: Negative for fever, chills, fatigue and unexpected weight change.  Gastrointestinal: Negative for nausea, vomiting, abdominal pain and diarrhea.  Musculoskeletal: Negative for joint swelling.       Objective:   Physical Exam  Constitutional: He appears well-developed and well-nourished. No distress.  Cardiovascular: Normal rate, regular rhythm and normal heart sounds.  Exam reveals no gallop and no friction rub.   No murmur heard. Pulmonary/Chest: Effort normal and breath sounds normal. No respiratory distress. He has no wheezes. He has no rales.  Musculoskeletal:       Knee with well healed scar, no significant edema          Assessment & Plan:

## 2012-01-13 NOTE — Assessment & Plan Note (Signed)
His inflammatory markers are normal, he is doing well and appears to have good response to therapy.  At this time I think he can complete the IV therapy this Saturday when his 6 weeks have completed and transition to po therapy for 6 months with amoxicillin.  He will follow up at that time, or sooner if he develops any new problems.  He does see Dr. Charlann Boxer tomorrow.  PICC will be pulled this Saturday by home health.

## 2012-01-14 ENCOUNTER — Telehealth: Payer: Self-pay | Admitting: Infectious Disease

## 2012-01-14 MED ORDER — AMOXICILLIN 500 MG PO CAPS: 500.0000 mg | ORAL_CAPSULE | Freq: Three times a day (TID) | ORAL | Status: AC

## 2012-01-14 NOTE — Telephone Encounter (Signed)
Pt states his amoxicilliin was not called in. Will send in

## 2012-01-18 ENCOUNTER — Ambulatory Visit: Payer: Medicare Other | Admitting: Rehabilitative and Restorative Service Providers"

## 2012-05-09 IMAGING — CR DG CHEST 2V
2 series · 2 of 2 positions shown · non-contrast
Comparison: 02/25/2010

CLINICAL DATA: Left knee osteoarthritis.  Preop respiratory exam
for knee joint replacement.  Hypertension.

CHEST - 2 VIEW

[w chest pa]
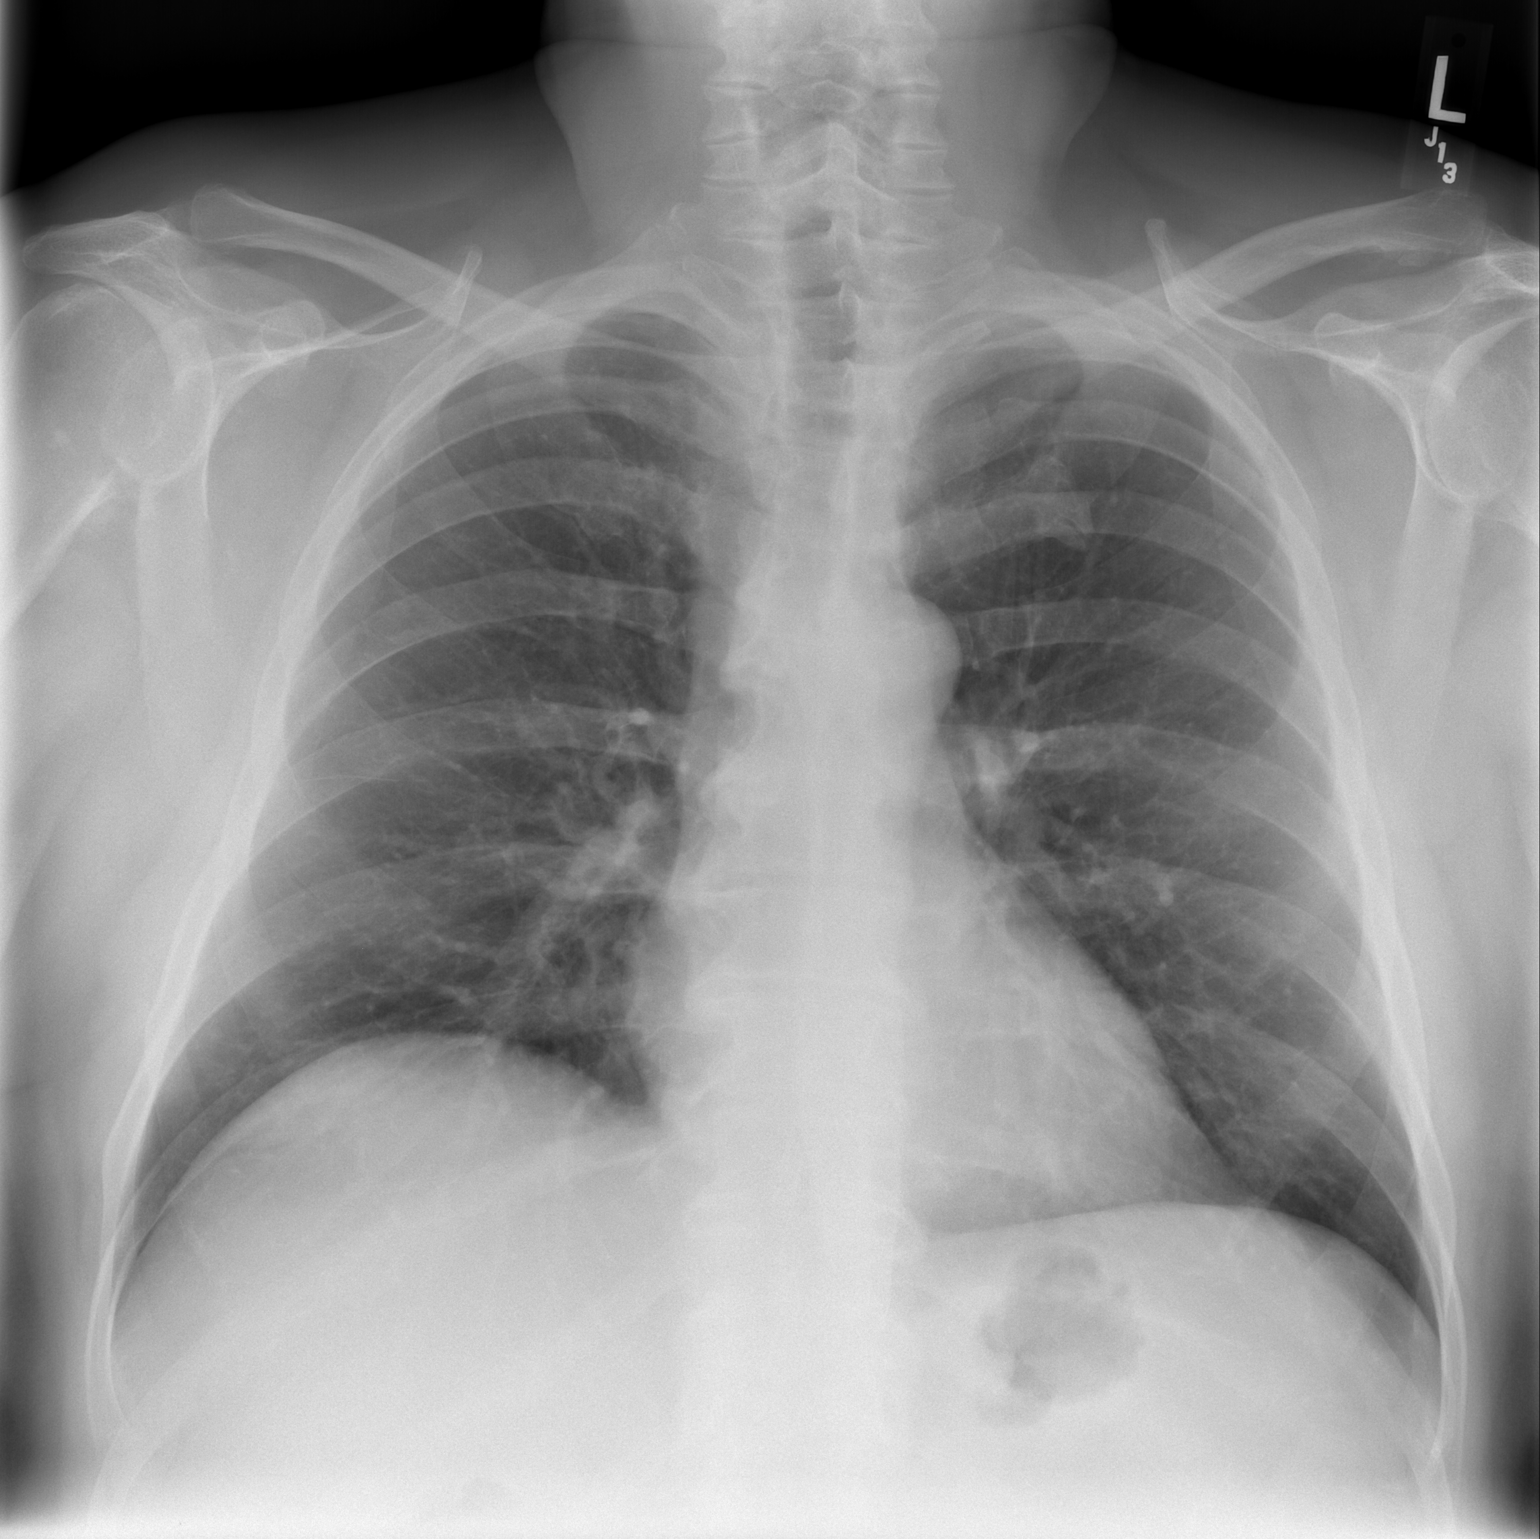

[w chest lat]
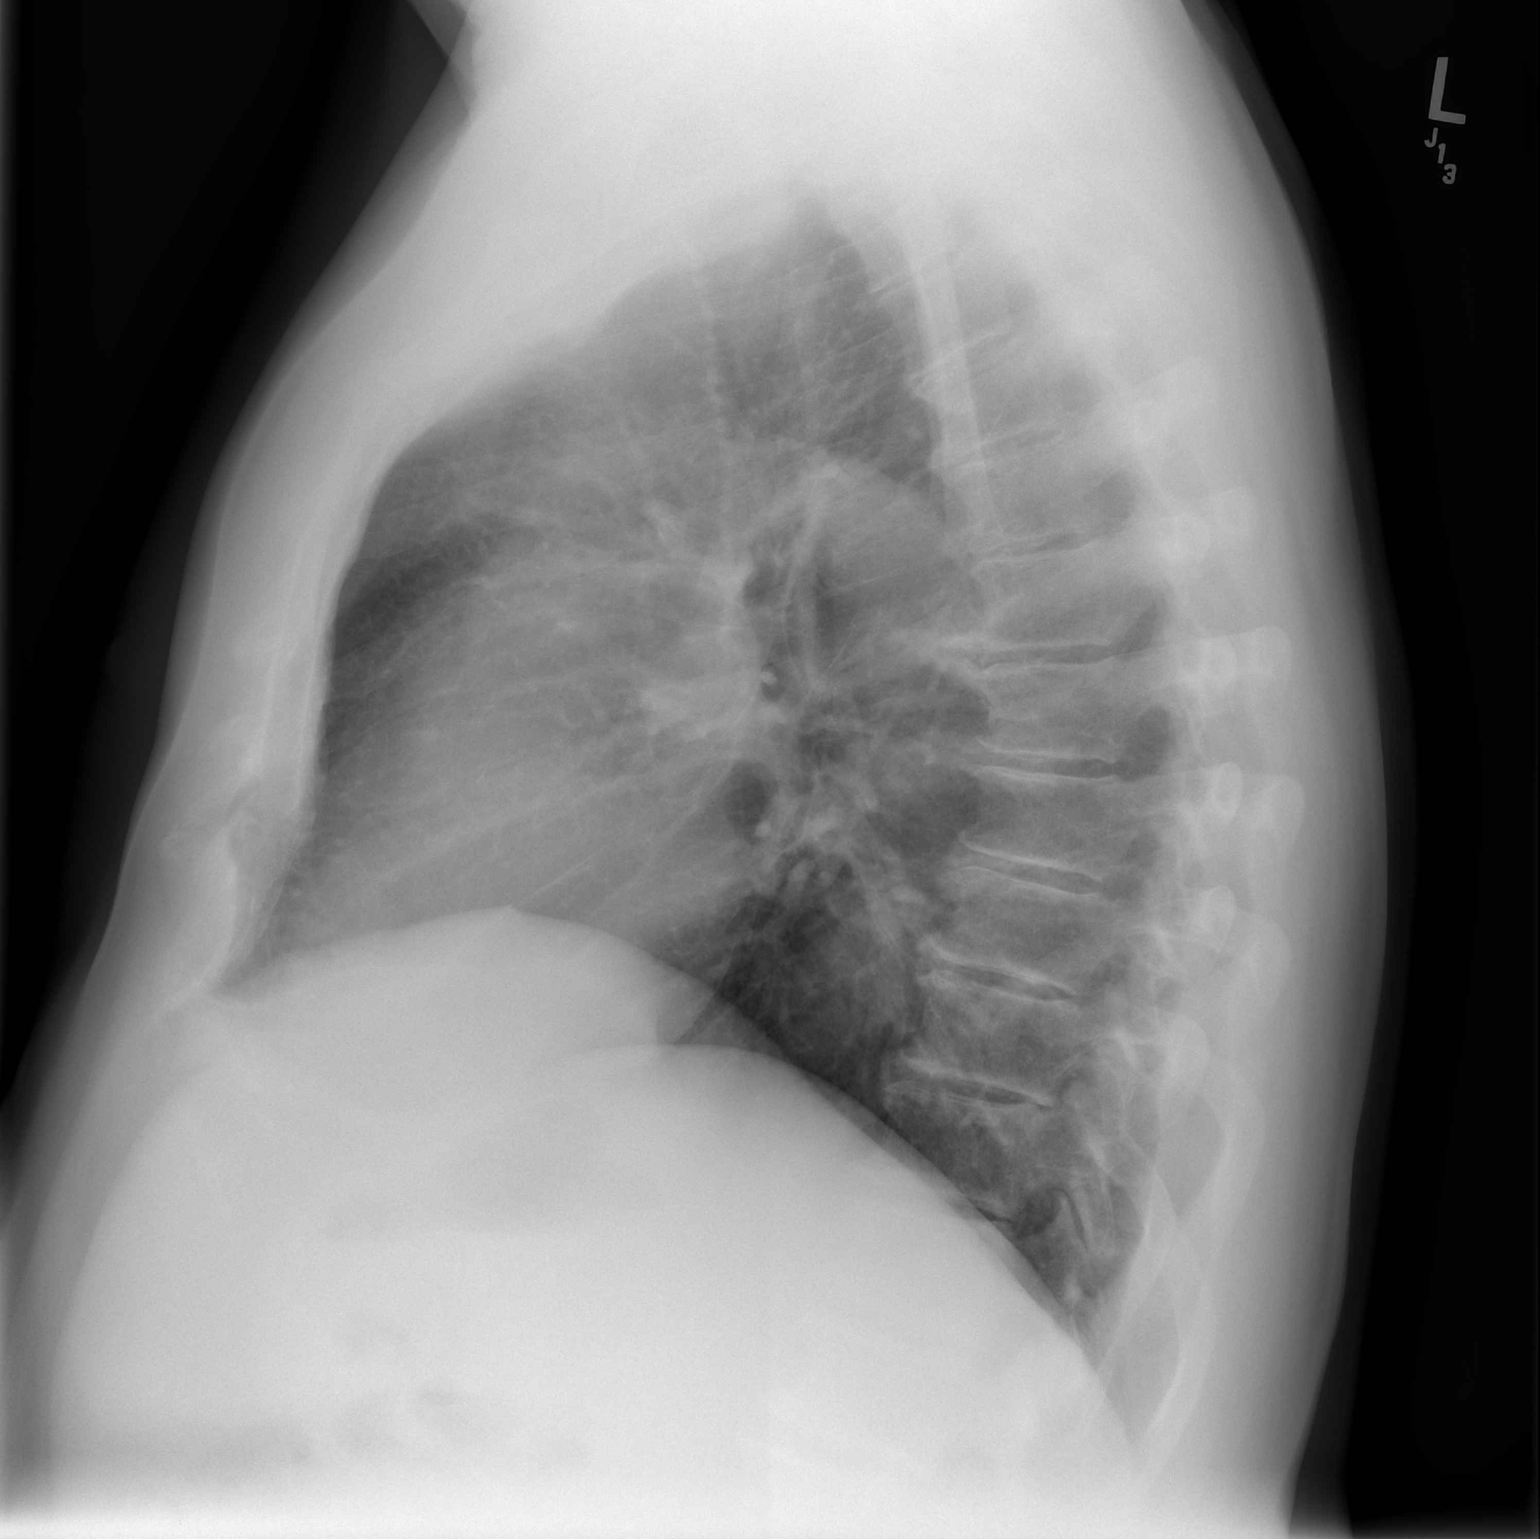

[2 of 2 positions shown; findings below may reference images not displayed]

FINDINGS: The heart size and mediastinal contours are within
normal limits.  Both lungs are clear.  The visualized skeletal
structures are unremarkable.
IMPRESSION: No active cardiopulmonary disease.

## 2012-07-03 ENCOUNTER — Other Ambulatory Visit: Payer: Self-pay | Admitting: Licensed Clinical Social Worker

## 2012-07-04 ENCOUNTER — Other Ambulatory Visit: Payer: Self-pay | Admitting: Licensed Clinical Social Worker

## 2012-07-04 DIAGNOSIS — T8450XA Infection and inflammatory reaction due to unspecified internal joint prosthesis, initial encounter: Secondary | ICD-10-CM

## 2012-07-04 MED ORDER — AMOXICILLIN 500 MG PO TABS: 500.0000 mg | ORAL_TABLET | Freq: Two times a day (BID) | ORAL | Status: AC

## 2012-07-13 ENCOUNTER — Encounter: Payer: Self-pay | Admitting: Internal Medicine

## 2012-07-13 ENCOUNTER — Ambulatory Visit (INDEPENDENT_AMBULATORY_CARE_PROVIDER_SITE_OTHER): Payer: Medicare Other | Admitting: Internal Medicine

## 2012-07-13 VITALS — BP 131/75 | HR 54 | Temp 97.7°F | Ht 70.5 in | Wt 215.0 lb

## 2012-07-13 DIAGNOSIS — T8450XA Infection and inflammatory reaction due to unspecified internal joint prosthesis, initial encounter: Secondary | ICD-10-CM

## 2012-07-13 DIAGNOSIS — T8459XA Infection and inflammatory reaction due to other internal joint prosthesis, initial encounter: Secondary | ICD-10-CM

## 2012-07-13 DIAGNOSIS — Z96659 Presence of unspecified artificial knee joint: Secondary | ICD-10-CM

## 2012-07-13 NOTE — Progress Notes (Signed)
  Subjective:    Patient ID: Jordan Vazquez, male    DOB: 17-Jan-1946, 66 y.o.   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 He grew enterococcus and finished a 6 week course of IV ampicillin and has been on amoxicillin oral therapy for the last 6 months. He denies any fever or chills, he gets occasional swelling of his knee but not associated with any fever or warmth. He feels overall that he is doing well. His inflammatory markers at the time of completion of therapy were normal. He has had no side effects from the antibiotics including no diarrhea, rash or abdominal discomfort.   Review of Systems  Constitutional: Negative for fever, chills and fatigue.  Gastrointestinal: Negative for nausea, vomiting, abdominal pain, diarrhea and abdominal distention.  Musculoskeletal: Negative for myalgias.       Occasional swelling of both knees that resolved with rest       Objective:   Physical Exam  Cardiovascular: Normal rate, regular rhythm and normal heart sounds.  Exam reveals no gallop and no friction rub.   No murmur heard. Musculoskeletal: Normal range of motion. He exhibits no edema and no tenderness.  Skin: No rash noted.          Assessment & Plan:

## 2012-07-13 NOTE — Assessment & Plan Note (Signed)
He has been doing well with his knee replacement. At this point, with a poly-exchange, he has completed a long course of IV therapy and prolonged course of oral amoxicillin. He currently has a 3 month supply which she will go ahead and complete and at that time as long as he is continuing to do well, I have advised him to stop the antibiotics. There is no indication for long-term suppression, particularly with his fairly young age for lifetime suppression. He is doing very well and the risk of continued antibiotic use is higher than the benefits. He was told to return if he has any significant symptoms though was advised that if he has warmth and swelling he will need to see Dr. Charlann Boxer to evaluate for infection.

## 2012-09-12 ENCOUNTER — Ambulatory Visit (HOSPITAL_COMMUNITY)
Admission: RE | Admit: 2012-09-12 | Discharge: 2012-09-12 | Disposition: A | Discharge status: Home / Self Care | Payer: Medicare Other | Source: Ambulatory Visit | Attending: Physical Medicine and Rehabilitation | Admitting: Physical Medicine and Rehabilitation

## 2012-09-12 ENCOUNTER — Other Ambulatory Visit (HOSPITAL_COMMUNITY): Payer: Self-pay | Admitting: Physical Medicine and Rehabilitation

## 2012-09-12 DIAGNOSIS — M545 Low back pain, unspecified: Secondary | ICD-10-CM | POA: Insufficient documentation

## 2012-09-12 DIAGNOSIS — M51379 Other intervertebral disc degeneration, lumbosacral region without mention of lumbar back pain or lower extremity pain: Secondary | ICD-10-CM | POA: Insufficient documentation

## 2012-09-12 DIAGNOSIS — M5137 Other intervertebral disc degeneration, lumbosacral region: Secondary | ICD-10-CM | POA: Insufficient documentation

## 2012-09-12 DIAGNOSIS — Q762 Congenital spondylolisthesis: Secondary | ICD-10-CM | POA: Insufficient documentation

## 2015-08-04 ENCOUNTER — Telehealth: Payer: Self-pay | Admitting: Hematology

## 2015-08-04 NOTE — Telephone Encounter (Signed)
New patient appt-s/w Kristeen Miss @ Dr. Laurann Montana office and gave np appt for 10/05 @ 11:15 w/Dr. Irene Limbo.  Referring Dr. Laurann Montana Dx- Chronic Leukocytosis, Thrombocytosis   Referral information scanned

## 2015-08-20 ENCOUNTER — Telehealth: Payer: Self-pay | Admitting: Hematology

## 2015-08-20 ENCOUNTER — Other Ambulatory Visit (HOSPITAL_BASED_OUTPATIENT_CLINIC_OR_DEPARTMENT_OTHER): Payer: Medicare Other

## 2015-08-20 ENCOUNTER — Ambulatory Visit (HOSPITAL_BASED_OUTPATIENT_CLINIC_OR_DEPARTMENT_OTHER): Payer: Medicare Other | Admitting: Hematology

## 2015-08-20 ENCOUNTER — Encounter: Payer: Self-pay | Admitting: Hematology

## 2015-08-20 VITALS — BP 135/69 | HR 53 | Temp 98.0°F | Resp 19 | Ht 70.5 in | Wt 215.4 lb

## 2015-08-20 DIAGNOSIS — D473 Essential (hemorrhagic) thrombocythemia: Secondary | ICD-10-CM

## 2015-08-20 DIAGNOSIS — D75839 Thrombocytosis, unspecified: Secondary | ICD-10-CM

## 2015-08-20 DIAGNOSIS — D72829 Elevated white blood cell count, unspecified: Secondary | ICD-10-CM | POA: Diagnosis not present

## 2015-08-20 LAB — CBC & DIFF AND RETIC
BASO%: 0.9 % (ref 0.0–2.0)
BASOS ABS: 0.1 10*3/uL (ref 0.0–0.1)
EOS ABS: 0.5 10*3/uL (ref 0.0–0.5)
EOS%: 4.1 % (ref 0.0–7.0)
HEMATOCRIT: 48.6 % (ref 38.4–49.9)
HEMOGLOBIN: 15.9 g/dL (ref 13.0–17.1)
IMMATURE RETIC FRACT: 11 % — AB (ref 3.00–10.60)
LYMPH%: 10.3 % — AB (ref 14.0–49.0)
MCH: 28.9 pg (ref 27.2–33.4)
MCHC: 32.8 g/dL (ref 32.0–36.0)
MCV: 88.2 fL (ref 79.3–98.0)
MONO#: 1.1 10*3/uL — ABNORMAL HIGH (ref 0.1–0.9)
MONO%: 9.8 % (ref 0.0–14.0)
NEUT#: 8.5 10*3/uL — ABNORMAL HIGH (ref 1.5–6.5)
NEUT%: 74.9 % (ref 39.0–75.0)
PLATELETS: 645 10*3/uL — AB (ref 140–400)
RBC: 5.51 10*6/uL (ref 4.20–5.82)
RDW: 16.2 % — ABNORMAL HIGH (ref 11.0–14.6)
Retic %: 1.61 % (ref 0.80–1.80)
Retic Ct Abs: 88.71 10*3/uL (ref 34.80–93.90)
WBC: 11.4 10*3/uL — AB (ref 4.0–10.3)
lymph#: 1.2 10*3/uL (ref 0.9–3.3)
nRBC: 0 % (ref 0–0)

## 2015-08-20 LAB — COMPREHENSIVE METABOLIC PANEL (CC13)
ALBUMIN: 4.4 g/dL (ref 3.5–5.0)
ALK PHOS: 75 U/L (ref 40–150)
ALT: 43 U/L (ref 0–55)
ANION GAP: 7 meq/L (ref 3–11)
AST: 37 U/L — ABNORMAL HIGH (ref 5–34)
BILIRUBIN TOTAL: 0.89 mg/dL (ref 0.20–1.20)
BUN: 21.7 mg/dL (ref 7.0–26.0)
CO2: 32 mEq/L — ABNORMAL HIGH (ref 22–29)
CREATININE: 0.9 mg/dL (ref 0.7–1.3)
Calcium: 9.9 mg/dL (ref 8.4–10.4)
Chloride: 102 mEq/L (ref 98–109)
EGFR: 83 mL/min/{1.73_m2} — AB (ref >90)
Glucose: 98 mg/dl (ref 70–140)
Potassium: 4.5 mEq/L (ref 3.5–5.1)
Sodium: 141 mEq/L (ref 136–145)
TOTAL PROTEIN: 7.8 g/dL (ref 6.4–8.3)

## 2015-08-20 LAB — CHCC SMEAR

## 2015-08-20 NOTE — Progress Notes (Signed)
[name]    HEMATOLOGY/ONCOLOGY CONSULTATION NOTE  Date of Service: 08/20/2015  Patient Care Team: [name], MD as PCP - General (Internal Medicine)  CHIEF COMPLAINTS/PURPOSE OF CONSULTATION:  Evaluation of chronic thrombocytosis and leukocytosis  HISTORY OF PRESENTING ILLNESS:  Jordan Vazquez is a wonderful 69 y.o. male who has been referred to [place] by Dr [name] for evaluation and management of chronic leukocytosis and thrombocytosis.  Patient is a history of hypertension and bad osteoarthritis and follows with [facility]. 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 Patient notes that his primary care physician has noted elevated WBC count and platelets for the last several years. He notes that about 2 months ago he had a dental abscess requiring dental extraction and antibiotics.  About 3 years ago in 2013 he had an infection of his rt TKA requiring I&D debridement and antibiotics. Has never had blood clots. Denies any other issues with frequent infections. No fevers or chills currently. No night sweats. No unexplained weight loss. Other than that he has been feeling quite well.  CBC on 07/14/2015  showed WBC count of 11.3k And platelets of 626k hemoglobin was normal at 15.5 with hematocrit of 47.3 MCV 90.5. Rheumatoid factor negative CBC done today on 08/20/2015 showed persistent leukocytosis of 11.4k and thrombocytosis with platelets of 645k. Patient notes that he does take a daily aspirin for primary prophylaxis.  No abdominal pain. No other acute new symptoms.   MEDICAL HISTORY:  Past Medical History  Diagnosis Date  . Hypertension   . Arthritis     arhtiritis in knees , shoulders, elbows   Hyperlipidemia Adenomatous colon polyp Leukocytosis and thrombocytosis Pain management with Dr. Greta Doom Erectile dysfunction  SURGICAL HISTORY: Past Surgical History  Procedure Laterality Date  .  Hernia repair      right inguinal hernia surgery   . Joint replacement      left knee 5/12, right TKA 10/12   . Cardiac catheterization      16 years ago negative   . I&d knee with poly exchange  12/06/2011    Procedure: IRRIGATION AND DEBRIDEMENT KNEE WITH POLY EXCHANGE;  Surgeon: Mauri Pole, MD;  Location: [facility];  Service: Orthopedics;  Laterality: Right;  Right Total Knee Irrigation and Debridement with Poly Exchange    SOCIAL HISTORY: Social History   Social History  . Marital Status: Single    Spouse Name: N/A  . Number of Children: N/A  . Years of Education: N/A   Occupational History  . Not on file.   Social History Main Topics  . Smoking status: Former Smoker -- 0.25 packs/day    Types: Cigarettes    Quit date: 11/16/1963  . Smokeless tobacco: Never Used  . Alcohol Use: 1.2 oz/week    2 Cans of beer per week  . Drug Use: No  . Sexual Activity: Not on file   Other Topics Concern  . Not on file   Social History Narrative    FAMILY HISTORY: History reviewed. No pertinent family history.  ALLERGIES:  is allergic to niacin and related.  MEDICATIONS:  Current Outpatient Prescriptions  Medication Sig Dispense Refill  . aspirin 325 MG tablet Take 650 mg by mouth daily.    [name] atenolol (TENORMIN) 50 MG tablet TK 1 T PO QD.  3  . atorvastatin (LIPITOR) 40 MG tablet Take 40 mg by mouth every morning.    . chlorthalidone (  HYGROTON) 25 MG tablet TK 1 T PO EVERY MORNING  3  . Cholecalciferol (VITAMIN D-3) 1000 UNITS CAPS Take 1,000 Units by mouth daily.    . Multiple Vitamin (MULITIVITAMIN WITH MINERALS) TABS Take 1 tablet by mouth daily.    . Omega-3 Fatty Acids (SEA-OMEGA 30 PO) Take 1 capsule by mouth daily.    [name] oxyCODONE (OXY IR/ROXICODONE) 5 MG immediate release tablet TK 1.5 TS PO Q 6 H PRN  0  . tiZANidine (ZANAFLEX) 2 MG tablet TK 1 T PO QID PRN  0   No current facility-administered medications for this visit.    REVIEW OF SYSTEMS:    10 Point review  of Systems was done is negative except as noted above.  PHYSICAL EXAMINATION: ECOG PERFORMANCE STATUS: 1 - Symptomatic but completely ambulatory  . Filed Vitals:   08/20/15 1024  BP: 135/69  Pulse: 53  Temp: 98 F (36.7 C)  Resp: 19    Filed Vitals:   08/20/15 1024  Height: 5' 10.5" (1.791 m)  Weight: 215 lb 6.4 oz (97.705 kg)   Filed Weights   08/20/15 1024  Weight: 215 lb 6.4 oz (97.705 kg)   .Body mass index is 30.46 kg/(m^2).  GENERAL:alert, in no acute distress and comfortable SKIN: skin color, texture, turgor are normal, no rashes or significant lesions EYES: normal, conjunctiva are pink and non-injected, sclera clear OROPHARYNX:no exudate, no erythema and lips, buccal mucosa, and tongue normal  NECK: supple, no JVD, thyroid normal size, non-tender, without nodularity LYMPH:  no palpable lymphadenopathy in the cervical, axillary or inguinal LUNGS: clear to auscultation with normal respiratory effort HEART: regular rate & rhythm,  no murmurs and no lower extremity edema ABDOMEN: abdomen soft, non-tender, normoactive bowel sounds  no overt hepatosplenomegaly  Musculoskeletal: no cyanosis of digits and no clubbing  PSYCH: alert & oriented x 3 with fluent speech NEURO: no focal motor/sensory deficits  LABORATORY DATA:  I have reviewed the data as listed  . CBC Latest Ref Rng 08/20/2015 12/08/2011 12/07/2011  WBC 4.0 - 10.3 10e3/uL 11.4(H) 11.2(H) 13.0(H)  Hemoglobin 13.0 - 17.1 g/dL 15.9 12.9(L) 13.3  Hematocrit 38.4 - 49.9 % 48.6 38.5(L) 41.5  Platelets 140 - 400 10e3/uL 645(H) 561(H) 665(H)    . CMP Latest Ref Rng 08/20/2015 12/08/2011 12/07/2011  Glucose 70 - 140 mg/dl 98 103(H) 98  BUN 7.0 - 26.0 mg/dL 21.7 13 17   Creatinine 0.7 - 1.3 mg/dL 0.9 0.85 0.81  Sodium 136 - 145 mEq/L 141 133(L) 135  Potassium 3.5 - 5.1 mEq/L 4.5 3.5 4.9  Chloride 96 - 112 mEq/L - 96 100  CO2 22 - 29 mEq/L 32(H) 30 28  Calcium 8.4 - 10.4 mg/dL 9.9 10.0 8.9  Total Protein 6.4 - 8.3  g/dL 7.8 - -  Total Bilirubin 0.20 - 1.20 mg/dL 0.89 - -  Alkaline Phos 40 - 150 U/L 75 - -  AST 5 - 34 U/L 37(H) - -  ALT 0 - 55 U/L 43 - -     Component     Latest Ref Rng 08/20/2015  Sed Rate     0 - 20 mm/hr 1  CRP     <5.63 mg/dL <8.7  Cyclic Citrullin Peptide Ab      <16    Peripheral blood smear: Personally reviewed by me  Normocytic red cells. No increased schistocytes. Increased WBCs with slight myeloid left shift. Significantly increased platelet counts with multiple large platelets. Some platelet clumping might lead to underestimation of  actual platelet numbers.      RADIOGRAPHIC STUDIES: I have personally reviewed the radiological images as listed and agreed with the findings in the report. No results found.  ASSESSMENT & PLAN:   69 year old Caucasian male with   #1 Thrombocytosis and mild leukocytosis. 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 No previous history of thrombosis. However his age about 2 and history of hypertension with him at high risk for thrombotic events. Plan -We will set up the patient for follow-up in the week of 09/01/2015 to discuss starting the patient on hydroxyurea. -Hydroxyurea to keep the platelets between 100 k to 400 K. Studies if shown that this might reduce the risk of thrombosis from about 25% to less than 4%. -Would recommend continuing aspirin 81 mg by mouth daily. -We'll need to be monitored for leukopenia and anemia as well as other side effects of hydroxyurea after starting this . - Patient is  and ex smoker -was encouraged to continue smoking cessation. -Continue follow-up with primary care physician for other medical issues.   All of the patients questions were answered  to his apparent satisfaction apparent satisfaction. The patient knows to call the clinic with any problems, questions or concerns.  I spent 45 minutes counseling the patient face to  face. The total time spent in the appointment was 60 minutes and more than 50% was on counseling and direct patient cares.    Sullivan Lone MD [place] AAHIVMS [facility] Hematology/Oncology Physician [facility]  (Office):       [phone] (Work cell):  [phone] (Fax):           [phone]  08/20/2015 11:20 AM

## 2015-08-20 NOTE — Telephone Encounter (Signed)
perpof to send pt back to lab-no sch appt as needed basis

## 2015-08-21 LAB — CYCLIC CITRUL PEPTIDE ANTIBODY, IGG

## 2015-08-21 LAB — SEDIMENTATION RATE: Sed Rate: 1 mm/hr (ref 0–20)

## 2015-08-21 LAB — C-REACTIVE PROTEIN

## 2015-09-04 ENCOUNTER — Telehealth: Payer: Self-pay | Admitting: Hematology

## 2015-09-04 ENCOUNTER — Encounter: Payer: Self-pay | Admitting: Hematology

## 2015-09-04 ENCOUNTER — Ambulatory Visit (HOSPITAL_BASED_OUTPATIENT_CLINIC_OR_DEPARTMENT_OTHER): Payer: Medicare Other | Admitting: Hematology

## 2015-09-04 VITALS — BP 101/81 | HR 50 | Temp 97.6°F | Resp 18 | Ht 70.5 in | Wt 217.2 lb

## 2015-09-04 DIAGNOSIS — D72829 Elevated white blood cell count, unspecified: Secondary | ICD-10-CM

## 2015-09-04 DIAGNOSIS — D473 Essential (hemorrhagic) thrombocythemia: Secondary | ICD-10-CM | POA: Diagnosis not present

## 2015-09-04 MED ORDER — HYDROXYUREA 100 MG/ML ORAL SUSPENSION: 250.0000 mg | Freq: Every day | ORAL | Status: DC

## 2015-09-04 MED ORDER — B COMPLEX VITAMINS PO CAPS: 1.0000 | ORAL_CAPSULE | Freq: Every day | ORAL | Status: DC

## 2015-09-04 NOTE — Telephone Encounter (Signed)
per pof to sch pt appt-gave pt copy of avs °

## 2015-09-17 ENCOUNTER — Other Ambulatory Visit (HOSPITAL_BASED_OUTPATIENT_CLINIC_OR_DEPARTMENT_OTHER): Payer: Medicare Other

## 2015-09-17 DIAGNOSIS — D473 Essential (hemorrhagic) thrombocythemia: Secondary | ICD-10-CM

## 2015-09-17 LAB — CBC & DIFF AND RETIC
BASO%: 0.6 % (ref 0.0–2.0)
Basophils Absolute: 0.1 10*3/uL (ref 0.0–0.1)
EOS%: 3.4 % (ref 0.0–7.0)
Eosinophils Absolute: 0.4 10*3/uL (ref 0.0–0.5)
HCT: 46.1 % (ref 38.4–49.9)
HEMOGLOBIN: 15.3 g/dL (ref 13.0–17.1)
Immature Retic Fract: 14.4 % — ABNORMAL HIGH (ref 3.00–10.60)
LYMPH%: 10.8 % — ABNORMAL LOW (ref 14.0–49.0)
MCH: 29.9 pg (ref 27.2–33.4)
MCHC: 33.2 g/dL (ref 32.0–36.0)
MCV: 90.2 fL (ref 79.3–98.0)
MONO#: 1 10*3/uL — ABNORMAL HIGH (ref 0.1–0.9)
MONO%: 8.8 % (ref 0.0–14.0)
NEUT%: 76.4 % — ABNORMAL HIGH (ref 39.0–75.0)
NEUTROS ABS: 8.9 10*3/uL — AB (ref 1.5–6.5)
NRBC: 0 % (ref 0–0)
Platelets: 530 10*3/uL — ABNORMAL HIGH (ref 140–400)
RBC: 5.11 10*6/uL (ref 4.20–5.82)
RDW: 15.7 % — AB (ref 11.0–14.6)
Retic %: 1.57 % (ref 0.80–1.80)
Retic Ct Abs: 80.23 10*3/uL (ref 34.80–93.90)
WBC: 11.6 10*3/uL — AB (ref 4.0–10.3)
lymph#: 1.3 10*3/uL (ref 0.9–3.3)

## 2015-09-17 LAB — COMPREHENSIVE METABOLIC PANEL (CC13)
ALBUMIN: 4 g/dL (ref 3.5–5.0)
ALK PHOS: 74 U/L (ref 40–150)
ALT: 40 U/L (ref 0–55)
AST: 34 U/L (ref 5–34)
Anion Gap: 7 mEq/L (ref 3–11)
BUN: 23.4 mg/dL (ref 7.0–26.0)
CO2: 29 mEq/L (ref 22–29)
Calcium: 9.5 mg/dL (ref 8.4–10.4)
Chloride: 105 mEq/L (ref 98–109)
Creatinine: 1.1 mg/dL (ref 0.7–1.3)
EGFR: 69 mL/min/{1.73_m2} — AB (ref >90)
GLUCOSE: 94 mg/dL (ref 70–140)
POTASSIUM: 4.5 meq/L (ref 3.5–5.1)
SODIUM: 141 meq/L (ref 136–145)
Total Bilirubin: 0.75 mg/dL (ref 0.20–1.20)
Total Protein: 7.4 g/dL (ref 6.4–8.3)

## 2015-09-19 ENCOUNTER — Encounter: Payer: Self-pay | Admitting: Hematology

## 2015-09-19 DIAGNOSIS — D473 Essential (hemorrhagic) thrombocythemia: Secondary | ICD-10-CM | POA: Insufficient documentation

## 2015-09-19 HISTORY — DX: Essential (hemorrhagic) thrombocythemia: D47.3

## 2015-09-19 NOTE — Progress Notes (Signed)
Jordan Vazquez Kitchen  HEMATOLOGY ONCOLOGY PROGRESS NOTE  Date of service: .09/04/2015  Patient Care Team: [name], MD as PCP - General (Internal Medicine)  Diagnosis: Essential thrombocytosis  Current Treatment: Hydroxyurea 250mg  po daily  INTERVAL HISTORY:  Jordan Vazquez was here to follow up on his workup for chronic thrombocytosis and leukocytosis. He was noted to have Jak2 positive essential thrombocytosis. We went over the results in detail. Patient was explained what the diagnosis meant, treatment options and management. He is agreeable to start hydroxyurea. No other acute concerns at this time. He's never had venous or arterial thromboembolism previously.   REVIEW OF SYSTEMS:    10 Point review of systems of done and is negative except as noted above.  . Past Medical History  Diagnosis Date  . Hypertension   . 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 Essential thrombocytosis ([place]) 09/19/2015    Jak2 V617F mutation positive Essential thrombocytosis -On Hydroxyurea.     . Past Surgical History  Procedure Laterality Date  . Hernia repair      right inguinal hernia surgery   . Joint replacement      left knee 5/12, right TKA 10/12   . Cardiac catheterization      16 years ago negative   . I&d knee with poly exchange  12/06/2011    Procedure: IRRIGATION AND DEBRIDEMENT KNEE WITH POLY EXCHANGE;  Surgeon: Mauri Pole, MD;  Location: [facility];  Service: Orthopedics;  Laterality: Right;  Right Total Knee Irrigation and Debridement with Poly Exchange    . Social History  Substance Use Topics  . Smoking status: Former Smoker -- 0.25 packs/day    Types: Cigarettes    Quit date: 11/16/1963  . Smokeless tobacco: Never Used  . Alcohol Use: 1.2 oz/week    2 Cans of beer per week    ALLERGIES:  is allergic to niacin and related.  MEDICATIONS:  Current Outpatient Prescriptions  Medication Sig Dispense Refill  . aspirin 325 MG tablet Take 650 mg by mouth daily.    Jordan Vazquez Kitchen  atenolol (TENORMIN) 50 MG tablet TK 1 T PO QD.  3  . atorvastatin (LIPITOR) 40 MG tablet Take 40 mg by mouth every morning.    . chlorthalidone (HYGROTON) 25 MG tablet TK 1 T PO EVERY MORNING  3  . Cholecalciferol (VITAMIN D-3) 1000 UNITS CAPS Take 1,000 Units by mouth daily.    . Multiple Vitamin (MULITIVITAMIN WITH MINERALS) TABS Take 2 tablets by mouth daily.     . Omega-3 Fatty Acids (SEA-OMEGA 30 PO) Take 1 capsule by mouth daily.    Jordan Vazquez Kitchen oxyCODONE (OXY IR/ROXICODONE) 5 MG immediate release tablet TK 1.5 TS PO Q 6 H PRN  0  . tiZANidine (ZANAFLEX) 2 MG tablet TK 1 T PO QID PRN  0  . b complex vitamins capsule Take 1 capsule by mouth daily. 60 capsule 6  . hydroxyurea (HYDREA) 100 mg/mL SUSP Take 2.5 mLs (250 mg total) by mouth daily. 200 mL 1   No current facility-administered medications for this visit.    PHYSICAL EXAMINATION: ECOG PERFORMANCE STATUS: 1 - Symptomatic but completely ambulatory  . Filed Vitals:   09/04/15 0835  BP: 101/81  Pulse: 50  Temp: 97.6 F (36.4 C)  Resp: 18    Filed Weights   09/04/15 0835  Weight: 217 lb 3.2 oz (98.521 kg)   .Body mass index is 30.71 kg/(m^2).  GENERAL:alert, in no acute distress and comfortable SKIN:  skin color, texture, turgor are normal, no rashes or significant lesions EYES: normal, conjunctiva are pink and non-injected, sclera clear OROPHARYNX:no exudate, no erythema and lips, buccal mucosa, and tongue normal  NECK: supple, no JVD, thyroid normal size, non-tender, without nodularity LYMPH:  no palpable lymphadenopathy in the cervical, axillary or inguinal LUNGS: clear to auscultation with normal respiratory effort HEART: regular rate & rhythm,  no murmurs and no lower extremity edema ABDOMEN: abdomen soft, non-tender, normoactive bowel sounds  Musculoskeletal: no cyanosis of digits and no clubbing  PSYCH: alert & oriented x 3 with fluent speech NEURO: no focal motor/sensory deficits  LABORATORY DATA:   I have reviewed  the data as listed  . CBC Latest Ref Rng 09/17/2015 08/20/2015 12/08/2011  WBC 4.0 - 10.3 10e3/uL 11.6(H) 11.4(H) 11.2(H)  Hemoglobin 13.0 - 17.1 g/dL 15.3 15.9 12.9(L)  Hematocrit 38.4 - 49.9 % 46.1 48.6 38.5(L)  Platelets 140 - 400 10e3/uL 530(H) 645(H) 561(H)    . CMP Latest Ref Rng 09/17/2015 08/20/2015 12/08/2011  Glucose 70 - 140 mg/dl 94 98 103(H)  BUN 7.0 - 26.0 mg/dL 23.4 21.7 13  Creatinine 0.7 - 1.3 mg/dL 1.1 0.9 0.85  Sodium 136 - 145 mEq/L 141 141 133(L)  Potassium 3.5 - 5.1 mEq/L 4.5 4.5 3.5  Chloride 96 - 112 mEq/L - - 96  CO2 22 - 29 mEq/L 29 32(H) 30  Calcium 8.4 - 10.4 mg/dL 9.5 9.9 10.0  Total Protein 6.4 - 8.3 g/dL 7.4 7.8 -  Total Bilirubin 0.20 - 1.20 mg/dL 0.75 0.89 -  Alkaline Phos 40 - 150 U/L 74 75 -  AST 5 - 34 U/L 34 37(H) -  ALT 0 - 55 U/L 40 43 -     RADIOGRAPHIC STUDIES: I have personally reviewed the radiological images as listed and agreed with the findings in the report. No results found.  ASSESSMENT & PLAN: '  69 year old Caucasian male with   #1 Thrombocytosis and mild leukocytosis. 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 No previous history of thrombosis. However his age about 14 and history of hypertension with him at high risk for thrombotic events. Plan -We discussed in detail his diagnosis, natural history of disease, prognosis and treatment options. -We are starting him on a conservative dose of hydroxyurea at 250 mg po daily. -will need to adjust up the Hydroxyurea dose to keep the platelets between 100 k to 400 K. Studies have shown that this might reduce the risk of thrombosis from about 25% to less than 4%. - continue aspirin 81 mg by mouth daily. -We'll need to be monitored for leukopenia and anemia as well as other side effects of hydroxyurea after starting this . --Continue follow-up with primary care physician for other medical issues.   RTC with Dr Irene Limbo  in 4 weeks with cbc, cmp. Earlier if any new concerns  I spent 20 minutes counseling the patient face to face. The total time spent in the appointment was 25 minutes and more than 50% was on counseling and direct patient cares.    Sullivan Lone MD [place] AAHIVMS [facility] Hematology/Oncology Physician [facility]  (Office):       [phone] (Work cell):  [phone] (Fax):           [phone]

## 2015-10-06 ENCOUNTER — Ambulatory Visit (HOSPITAL_BASED_OUTPATIENT_CLINIC_OR_DEPARTMENT_OTHER): Payer: Medicare Other

## 2015-10-06 ENCOUNTER — Other Ambulatory Visit: Payer: Self-pay | Admitting: *Deleted

## 2015-10-06 ENCOUNTER — Ambulatory Visit (HOSPITAL_BASED_OUTPATIENT_CLINIC_OR_DEPARTMENT_OTHER): Payer: Medicare Other | Admitting: Hematology

## 2015-10-06 ENCOUNTER — Encounter: Payer: Self-pay | Admitting: Hematology

## 2015-10-06 ENCOUNTER — Telehealth: Payer: Self-pay | Admitting: Hematology

## 2015-10-06 VITALS — BP 135/72 | HR 43 | Temp 97.3°F | Resp 18 | Ht 70.5 in | Wt 218.5 lb

## 2015-10-06 DIAGNOSIS — D473 Essential (hemorrhagic) thrombocythemia: Secondary | ICD-10-CM | POA: Diagnosis not present

## 2015-10-06 LAB — CBC & DIFF AND RETIC
BASO%: 0.6 % (ref 0.0–2.0)
BASOS ABS: 0.1 10*3/uL (ref 0.0–0.1)
EOS ABS: 0.5 10*3/uL (ref 0.0–0.5)
EOS%: 4.2 % (ref 0.0–7.0)
HCT: 48.2 % (ref 38.4–49.9)
HEMOGLOBIN: 16.1 g/dL (ref 13.0–17.1)
IMMATURE RETIC FRACT: 10 % (ref 3.00–10.60)
LYMPH#: 1.2 10*3/uL (ref 0.9–3.3)
LYMPH%: 11 % — AB (ref 14.0–49.0)
MCH: 30 pg (ref 27.2–33.4)
MCHC: 33.4 g/dL (ref 32.0–36.0)
MCV: 89.9 fL (ref 79.3–98.0)
MONO#: 0.9 10*3/uL (ref 0.1–0.9)
MONO%: 7.7 % (ref 0.0–14.0)
NEUT#: 8.5 10*3/uL — ABNORMAL HIGH (ref 1.5–6.5)
NEUT%: 76.5 % — AB (ref 39.0–75.0)
NRBC: 0 % (ref 0–0)
PLATELETS: 560 10*3/uL — AB (ref 140–400)
RBC: 5.36 10*6/uL (ref 4.20–5.82)
RDW: 16 % — AB (ref 11.0–14.6)
Retic %: 1.6 % (ref 0.80–1.80)
Retic Ct Abs: 85.76 10*3/uL (ref 34.80–93.90)
WBC: 11.1 10*3/uL — AB (ref 4.0–10.3)

## 2015-10-06 LAB — COMPREHENSIVE METABOLIC PANEL (CC13)
ALT: 37 U/L (ref 0–55)
ANION GAP: 8 meq/L (ref 3–11)
AST: 35 U/L — ABNORMAL HIGH (ref 5–34)
Albumin: 4.2 g/dL (ref 3.5–5.0)
Alkaline Phosphatase: 82 U/L (ref 40–150)
BILIRUBIN TOTAL: 0.65 mg/dL (ref 0.20–1.20)
BUN: 21.1 mg/dL (ref 7.0–26.0)
CALCIUM: 9.8 mg/dL (ref 8.4–10.4)
CHLORIDE: 102 meq/L (ref 98–109)
CO2: 29 mEq/L (ref 22–29)
CREATININE: 1.1 mg/dL (ref 0.7–1.3)
EGFR: 72 mL/min/{1.73_m2} — AB (ref >90)
Glucose: 102 mg/dl (ref 70–140)
Potassium: 4.9 mEq/L (ref 3.5–5.1)
Sodium: 138 mEq/L (ref 136–145)
Total Protein: 7.6 g/dL (ref 6.4–8.3)

## 2015-10-06 MED ORDER — HYDROXYUREA 500 MG PO CAPS: 500.0000 mg | ORAL_CAPSULE | Freq: Every day | ORAL | Status: DC

## 2015-10-06 MED ORDER — HYDROXYUREA 300 MG PO CAPS: 300.0000 mg | ORAL_CAPSULE | Freq: Every day | ORAL | Status: DC

## 2015-10-06 NOTE — Telephone Encounter (Signed)
per pof to sch pt appt-gave pt copy of avs-sent back ot lab °

## 2015-10-08 NOTE — Progress Notes (Signed)
Jordan Vazquez  HEMATOLOGY ONCOLOGY PROGRESS NOTE  Date of service: .10/06/2015   Patient Care Team: Jordan Orn, MD as PCP - General (Internal Medicine)  Diagnosis: Essential thrombocytosis  Current Treatment: Hydroxyurea 250mg  po daily  INTERVAL HISTORY:  Jordan Vazquez was here to follow up on his Jak2 positive essential thrombocytosis. He has been taking his hydroxyurea 250 mg by mouth daily without any acute new concerns. Follow-up mouth sores/skin rashes/fevers or chills/infections/GI distress. Platelet counts improved with the mid 500'sk range. He is okay with increasing hydroxyurea to 500 mg by mouth daily. No other acute new symptoms or concerns.   REVIEW OF SYSTEMS:    10 Point review of systems of done and is negative except as noted above.  . Past Medical History  Diagnosis Date  . Hypertension   . 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     . Past Surgical History  Procedure Laterality Date  . Hernia repair      right inguinal hernia surgery   . Joint replacement      left knee 5/12, right TKA 10/12   . Cardiac catheterization      16 years ago negative   . I&d knee with poly exchange  12/06/2011    Procedure: IRRIGATION AND DEBRIDEMENT KNEE WITH POLY EXCHANGE;  Surgeon: Jordan Pole, MD;  Location: [facility];  Service: Orthopedics;  Laterality: Right;  Right Total Knee Irrigation and Debridement with Poly Exchange    . Social History  Substance Use Topics  . Smoking status: Former Smoker -- 0.25 packs/day    Types: Cigarettes    Quit date: 11/16/1963  . Smokeless tobacco: Never Used  . Alcohol Use: 1.2 oz/week    2 Cans of beer per week    ALLERGIES:  is allergic to niacin and related.  MEDICATIONS:  Current Outpatient Prescriptions  Medication Sig Dispense Refill  . aspirin 325 MG tablet Take 650 mg by mouth daily.    Jordan Vazquez atenolol  (TENORMIN) 50 MG tablet TK 1 T PO QD.  3  . atorvastatin (LIPITOR) 40 MG tablet Take 40 mg by mouth every morning.    Jordan Vazquez b complex vitamins capsule Take 1 capsule by mouth daily. 60 capsule 6  . chlorthalidone (HYGROTON) 25 MG tablet TK 1 T PO EVERY MORNING  3  . Cholecalciferol (VITAMIN D-3) 1000 UNITS CAPS Take 1,000 Units by mouth daily.    . hydroxyurea (HYDREA) 500 MG capsule Take 1 capsule (500 mg total) by mouth daily. May take with food to minimize GI side effects. 30 capsule 1  . Multiple Vitamin (MULITIVITAMIN WITH MINERALS) TABS Take 2 tablets by mouth daily.     . Omega-3 Fatty Acids (SEA-OMEGA 30 PO) Take 1 capsule by mouth daily.    Jordan Vazquez oxyCODONE (OXY IR/ROXICODONE) 5 MG immediate release tablet TK 1.5 TS PO Q 6 H PRN  0  . tiZANidine (ZANAFLEX) 2 MG tablet TK 1 T PO QID PRN  0   No current facility-administered medications for this visit.    PHYSICAL EXAMINATION: ECOG PERFORMANCE STATUS: 1 - Symptomatic but completely ambulatory  . Filed Vitals:   10/06/15 0802  BP: 135/72  Pulse: 43  Temp: 97.3 F (36.3 C)  Resp: 18    Filed Weights   10/06/15 0802  Weight: 218 lb 8 oz (99.111 kg)   .Body mass index is 30.9  kg/(m^2).  GENERAL:alert, in no acute distress and comfortable SKIN: skin color, texture, turgor are normal, no rashes or significant lesions EYES: normal, conjunctiva are pink and non-injected, sclera clear OROPHARYNX:no exudate, no erythema and lips, buccal mucosa, and tongue normal  NECK: supple, no JVD, thyroid normal size, non-tender, without nodularity LYMPH:  no palpable lymphadenopathy in the cervical, axillary or inguinal LUNGS: clear to auscultation with normal respiratory effort HEART: regular rate & rhythm,  no murmurs and no lower extremity edema ABDOMEN: abdomen soft, non-tender, normoactive bowel sounds  Musculoskeletal: no cyanosis of digits and no clubbing  PSYCH: alert & oriented x 3 with fluent speech NEURO: no focal motor/sensory  deficits  LABORATORY DATA:   I have reviewed the data as listed  . CBC Latest Ref Rng 10/06/2015 09/17/2015 08/20/2015  WBC 4.0 - 10.3 10e3/uL 11.1(H) 11.6(H) 11.4(H)  Hemoglobin 13.0 - 17.1 g/dL 16.1 15.3 15.9  Hematocrit 38.4 - 49.9 % 48.2 46.1 48.6  Platelets 140 - 400 10e3/uL 560(H) 530(H) 645(H)    . CMP Latest Ref Rng 10/06/2015 09/17/2015 08/20/2015  Glucose 70 - 140 mg/dl 102 94 98  BUN 7.0 - 26.0 mg/dL 21.1 23.4 21.7  Creatinine 0.7 - 1.3 mg/dL 1.1 1.1 0.9  Sodium 136 - 145 mEq/L 138 141 141  Potassium 3.5 - 5.1 mEq/L 4.9 4.5 4.5  Chloride 96 - 112 mEq/L - - -  CO2 22 - 29 mEq/L 29 29 32(H)  Calcium 8.4 - 10.4 mg/dL 9.8 9.5 9.9  Total Protein 6.4 - 8.3 g/dL 7.6 7.4 7.8  Total Bilirubin 0.20 - 1.20 mg/dL 0.65 0.75 0.89  Alkaline Phos 40 - 150 U/L 82 74 75  AST 5 - 34 U/L 35(H) 34 37(H)  ALT 0 - 55 U/L 37 40 43     RADIOGRAPHIC STUDIES: I have personally reviewed the radiological images as listed and agreed with the findings in the report. No results found.  ASSESSMENT & PLAN: '  69 year old Caucasian male with   #1 Thrombocytosis and mild leukocytosis. 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 No previous history of thrombosis. However his age about 24 and history of hypertension with him at high risk for thrombotic events. Platelet counts are down from the 600s to the mid-500s on 250 mg daily of hydroxyurea which was a small dose. Plan -Patient reports no toxicities from his low dose hydroxyurea. -We shall increase his hydroxyurea to 500 mg by mouth daily. He can continue his liquid hydroxyurea until it is finished at the new dose of 500mg  po daily and then start the 500 mg tabs. - continue aspirin 81 mg by mouth daily. ---Continue follow-up with primary care physician for other medical issues.   RTC with Dr Jordan Vazquez in 4 weeks with cbc, cmp. Earlier if any new concerns  I spent 15 minutes  counseling the patient face to face. The total time spent in the appointment was 15 minutes and more than 50% was on counseling and direct patient cares.    Jordan Lone MD [place] AAHIVMS [facility] Hematology/Oncology Physician [facility]  (Office):       [phone] (Work cell):  [phone] (Fax):           [phone]

## 2015-11-03 ENCOUNTER — Encounter: Payer: Self-pay | Admitting: Hematology

## 2015-11-03 ENCOUNTER — Ambulatory Visit (HOSPITAL_BASED_OUTPATIENT_CLINIC_OR_DEPARTMENT_OTHER): Payer: Medicare Other | Admitting: Hematology

## 2015-11-03 ENCOUNTER — Other Ambulatory Visit (HOSPITAL_BASED_OUTPATIENT_CLINIC_OR_DEPARTMENT_OTHER): Payer: Medicare Other

## 2015-11-03 ENCOUNTER — Telehealth: Payer: Self-pay | Admitting: Hematology

## 2015-11-03 VITALS — BP 138/63 | HR 47 | Temp 97.9°F | Resp 16 | Ht 70.5 in | Wt 220.0 lb

## 2015-11-03 DIAGNOSIS — D473 Essential (hemorrhagic) thrombocythemia: Secondary | ICD-10-CM

## 2015-11-03 LAB — COMPREHENSIVE METABOLIC PANEL
ALK PHOS: 75 U/L (ref 40–150)
ALT: 36 U/L (ref 0–55)
ANION GAP: 10 meq/L (ref 3–11)
AST: 33 U/L (ref 5–34)
Albumin: 4.1 g/dL (ref 3.5–5.0)
BUN: 21.8 mg/dL (ref 7.0–26.0)
CO2: 28 meq/L (ref 22–29)
Calcium: 9.8 mg/dL (ref 8.4–10.4)
Chloride: 102 mEq/L (ref 98–109)
Creatinine: 1.1 mg/dL (ref 0.7–1.3)
EGFR: 65 mL/min/{1.73_m2} — AB (ref >90)
GLUCOSE: 77 mg/dL (ref 70–140)
POTASSIUM: 4.5 meq/L (ref 3.5–5.1)
SODIUM: 140 meq/L (ref 136–145)
Total Bilirubin: 0.74 mg/dL (ref 0.20–1.20)
Total Protein: 7.8 g/dL (ref 6.4–8.3)

## 2015-11-03 LAB — CBC & DIFF AND RETIC
BASO%: 0.6 % (ref 0.0–2.0)
Basophils Absolute: 0.1 10*3/uL (ref 0.0–0.1)
EOS%: 3.6 % (ref 0.0–7.0)
Eosinophils Absolute: 0.4 10*3/uL (ref 0.0–0.5)
HCT: 49.5 % (ref 38.4–49.9)
HGB: 16.5 g/dL (ref 13.0–17.1)
Immature Retic Fract: 12.4 % — ABNORMAL HIGH (ref 3.00–10.60)
LYMPH%: 12.2 % — ABNORMAL LOW (ref 14.0–49.0)
MCH: 30.3 pg (ref 27.2–33.4)
MCHC: 33.3 g/dL (ref 32.0–36.0)
MCV: 91 fL (ref 79.3–98.0)
MONO#: 1 10*3/uL — ABNORMAL HIGH (ref 0.1–0.9)
MONO%: 9.7 % (ref 0.0–14.0)
NEUT%: 73.9 % (ref 39.0–75.0)
NEUTROS ABS: 7.4 10*3/uL — AB (ref 1.5–6.5)
NRBC: 0 % (ref 0–0)
Platelets: 485 10*3/uL — ABNORMAL HIGH (ref 140–400)
RBC: 5.44 10*6/uL (ref 4.20–5.82)
RDW: 16.8 % — AB (ref 11.0–14.6)
Retic %: 1.74 % (ref 0.80–1.80)
Retic Ct Abs: 94.66 10*3/uL — ABNORMAL HIGH (ref 34.80–93.90)
WBC: 10.1 10*3/uL (ref 4.0–10.3)
lymph#: 1.2 10*3/uL (ref 0.9–3.3)

## 2015-11-03 MED ORDER — HYDROXYUREA 300 MG PO CAPS: 300.0000 mg | ORAL_CAPSULE | ORAL | Status: DC

## 2015-11-03 MED ORDER — HYDROXYUREA 500 MG PO CAPS: 500.0000 mg | ORAL_CAPSULE | Freq: Every day | ORAL | Status: DC

## 2015-11-03 NOTE — Telephone Encounter (Signed)
Gave patient avs report and appointments for January  °

## 2015-11-04 ENCOUNTER — Telehealth: Payer: Self-pay

## 2015-11-04 ENCOUNTER — Encounter: Payer: Self-pay | Admitting: Hematology

## 2015-11-04 NOTE — Progress Notes (Signed)
Jordan Vazquez  HEMATOLOGY ONCOLOGY PROGRESS NOTE  Date of service: .   Patient Care Team: [name], MD as PCP - General (Internal Medicine)  Diagnosis: Essential thrombocytosis  Current Treatment: Hydroxyurea 500 mg po daily  INTERVAL HISTORY:  [name] was here to follow up on his Jak2 positive essential thrombocytosis. He has been taking his hydroxyurea 500 mg by mouth daily without any acute new concerns.platelet counts are down to the upper 400k range which is a progressive improvement. No reported adverse effects from the hydroxyurea. No other acute new symptoms. No mouth sores. No neck sores. No fevers/chills/infections.   REVIEW OF SYSTEMS:    10 Point review of systems of done and is negative except as noted above.  . Past Medical History  Diagnosis Date  . Hypertension   . 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     . Past Surgical History  Procedure Laterality Date  . Hernia repair      right inguinal hernia surgery   . Joint replacement      left knee 5/12, right TKA 10/12   . Cardiac catheterization      16 years ago negative   . I&d knee with poly exchange  12/06/2011    Procedure: IRRIGATION AND DEBRIDEMENT KNEE WITH POLY EXCHANGE;  Surgeon: Mauri Pole, MD;  Location: [facility];  Service: Orthopedics;  Laterality: Right;  Right Total Knee Irrigation and Debridement with Poly Exchange    . Social History  Substance Use Topics  . Smoking status: Former Smoker -- 0.25 packs/day    Types: Cigarettes    Quit date: 11/16/1963  . Smokeless tobacco: Never Used  . Alcohol Use: 1.2 oz/week    2 Cans of beer per week    ALLERGIES:  is allergic to niacin and related.  MEDICATIONS:  Current Outpatient Prescriptions  Medication Sig Dispense Refill  . aspirin 325 MG tablet Take 650 mg by mouth daily.    Jordan Vazquez atenolol (TENORMIN) 50 MG  tablet TK 1 T PO QD.  3  . atorvastatin (LIPITOR) 40 MG tablet Take 40 mg by mouth every morning.    Jordan Vazquez b complex vitamins capsule Take 1 capsule by mouth daily. 60 capsule 6  . chlorthalidone (HYGROTON) 25 MG tablet TK 1 T PO EVERY MORNING  3  . Cholecalciferol (VITAMIN D-3) 1000 UNITS CAPS Take 1,000 Units by mouth daily.    . hydroxyurea (DROXIA) 300 MG capsule Take 1 capsule (300 mg total) by mouth every other day. In addition to Hydroxyurea 500mg  po daily.May take with food to minimize GI upset. 30 capsule 2  . hydroxyurea (HYDREA) 500 MG capsule Take 1 capsule (500 mg total) by mouth daily. May take with food to minimize GI side effects. 30 capsule 2  . Multiple Vitamin (MULITIVITAMIN WITH MINERALS) TABS Take 2 tablets by mouth daily.     . Omega-3 Fatty Acids (SEA-OMEGA 30 PO) Take 1 capsule by mouth daily.    Jordan Vazquez oxyCODONE (OXY IR/ROXICODONE) 5 MG immediate release tablet TK 1.5 TS PO Q 6 H PRN  0  . tiZANidine (ZANAFLEX) 2 MG tablet TK 1 T PO QID PRN  0   No current facility-administered medications for this visit.    PHYSICAL EXAMINATION: ECOG PERFORMANCE STATUS: 1 - Symptomatic but completely ambulatory  . Filed Vitals:   11/03/15 0906  BP: 138/63  Pulse: 47  Temp: 97.9 F (36.6 C)  Resp: 16    Filed Weights   11/03/15 0906  Weight: 220 lb (99.791 kg)   .Body mass index is 31.11 kg/(m^2).  GENERAL:alert, in no acute distress and comfortable SKIN: skin color, texture, turgor are normal, no rashes or significant lesions EYES: normal, conjunctiva are pink and non-injected, sclera clear OROPHARYNX:no exudate, no erythema and lips, buccal mucosa, and tongue normal  NECK: supple, no JVD, thyroid normal size, non-tender, without nodularity LYMPH:  no palpable lymphadenopathy in the cervical, axillary or inguinal LUNGS: clear to auscultation with normal respiratory effort HEART: regular rate & rhythm,  no murmurs and no lower extremity edema ABDOMEN: abdomen soft,  non-tender, normoactive bowel sounds  Musculoskeletal: no cyanosis of digits and no clubbing  PSYCH: alert & oriented x 3 with fluent speech NEURO: no focal motor/sensory deficits  LABORATORY DATA:   I have reviewed the data as listed  . CBC Latest Ref Rng 11/03/2015 10/06/2015 09/17/2015  WBC 4.0 - 10.3 10e3/uL 10.1 11.1(H) 11.6(H)  Hemoglobin 13.0 - 17.1 g/dL 16.5 16.1 15.3  Hematocrit 38.4 - 49.9 % 49.5 48.2 46.1  Platelets 140 - 400 10e3/uL 485(H) 560(H) 530(H)    . CMP Latest Ref Rng 11/03/2015 10/06/2015 09/17/2015  Glucose 70 - 140 mg/dl 77 102 94  BUN 7.0 - 26.0 mg/dL 21.8 21.1 23.4  Creatinine 0.7 - 1.3 mg/dL 1.1 1.1 1.1  Sodium 136 - 145 mEq/L 140 138 141  Potassium 3.5 - 5.1 mEq/L 4.5 4.9 4.5  Chloride 96 - 112 mEq/L - - -  CO2 22 - 29 mEq/L 28 29 29   Calcium 8.4 - 10.4 mg/dL 9.8 9.8 9.5  Total Protein 6.4 - 8.3 g/dL 7.8 7.6 7.4  Total Bilirubin 0.20 - 1.20 mg/dL 0.74 0.65 0.75  Alkaline Phos 40 - 150 U/L 75 82 74  AST 5 - 34 U/L 33 35(H) 34  ALT 0 - 55 U/L 36 37 40     RADIOGRAPHIC STUDIES: I have personally reviewed the radiological images as listed and agreed with the findings in the report. No results found.  ASSESSMENT & PLAN: '  69 year old Caucasian male with   #1 Thrombocytosis and mild leukocytosis. 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 No previous history of thrombosis. However his age about 69 and history of hypertension with him at high risk for thrombotic events. Platelet counts are down from the 600s to the mid-500s on 250 mg daily of hydroxyurea and is now down to 485k on 500mg  po daily of hydroxyurea. No evidence of thrombosis. Plan -Patient reports no toxicities from his hydroxyurea. Platelet counts are continuing to improve but are not at the goal of less than 400k -We shall increase his hydroxyurea to 500 mg alternating with 750mg  daily. -once his platelet counts are  are in stable range we will space out his clinic visits. - continue aspirin 81 mg by mouth daily. ---Continue follow-up with primary care physician for other medical issues.   RTC with Dr Irene Limbo in 4 weeks with cbc, cmp. Earlier if any new concerns  I spent 15 minutes counseling the patient face to face. The total time spent in the appointment was 15 minutes and more than 50% was on counseling and direct patient cares.    Sullivan Lone MD [place] AAHIVMS [facility] [place] Hematology/Oncology Physician [facility]  (Office):       [phone] (Work cell):  [phone] (Fax):  [phone]  

## 2015-11-04 NOTE — Telephone Encounter (Signed)
Pt is approved for Droxia effective until 11/03/2016

## 2015-11-04 NOTE — Progress Notes (Signed)
Faxed droxia pa form to [facility]

## 2015-12-02 ENCOUNTER — Other Ambulatory Visit (HOSPITAL_BASED_OUTPATIENT_CLINIC_OR_DEPARTMENT_OTHER): Payer: Medicare Other

## 2015-12-02 ENCOUNTER — Ambulatory Visit (HOSPITAL_BASED_OUTPATIENT_CLINIC_OR_DEPARTMENT_OTHER): Payer: Medicare Other | Admitting: Hematology

## 2015-12-02 ENCOUNTER — Telehealth: Payer: Self-pay | Admitting: Hematology

## 2015-12-02 ENCOUNTER — Encounter: Payer: Self-pay | Admitting: Hematology

## 2015-12-02 VITALS — BP 129/72 | HR 50 | Temp 98.0°F | Resp 17 | Ht 70.5 in | Wt 221.9 lb

## 2015-12-02 DIAGNOSIS — D473 Essential (hemorrhagic) thrombocythemia: Secondary | ICD-10-CM

## 2015-12-02 LAB — COMPREHENSIVE METABOLIC PANEL
ALT: 41 U/L (ref 0–55)
ANION GAP: 8 meq/L (ref 3–11)
AST: 37 U/L — ABNORMAL HIGH (ref 5–34)
Albumin: 4.1 g/dL (ref 3.5–5.0)
Alkaline Phosphatase: 67 U/L (ref 40–150)
BUN: 21.2 mg/dL (ref 7.0–26.0)
CALCIUM: 9.3 mg/dL (ref 8.4–10.4)
CHLORIDE: 101 meq/L (ref 98–109)
CO2: 30 mEq/L — ABNORMAL HIGH (ref 22–29)
CREATININE: 1.1 mg/dL (ref 0.7–1.3)
EGFR: 65 mL/min/{1.73_m2} — AB (ref >90)
Glucose: 111 mg/dl (ref 70–140)
POTASSIUM: 4.5 meq/L (ref 3.5–5.1)
Sodium: 139 mEq/L (ref 136–145)
Total Bilirubin: 0.87 mg/dL (ref 0.20–1.20)
Total Protein: 7.4 g/dL (ref 6.4–8.3)

## 2015-12-02 LAB — CBC & DIFF AND RETIC
BASO%: 0.6 % (ref 0.0–2.0)
Basophils Absolute: 0.1 10*3/uL (ref 0.0–0.1)
EOS%: 3.3 % (ref 0.0–7.0)
Eosinophils Absolute: 0.3 10*3/uL (ref 0.0–0.5)
HEMATOCRIT: 47.9 % (ref 38.4–49.9)
HGB: 16.1 g/dL (ref 13.0–17.1)
Immature Retic Fract: 8.6 % (ref 3.00–10.60)
LYMPH%: 9.1 % — AB (ref 14.0–49.0)
MCH: 30.9 pg (ref 27.2–33.4)
MCHC: 33.6 g/dL (ref 32.0–36.0)
MCV: 91.9 fL (ref 79.3–98.0)
MONO#: 0.8 10*3/uL (ref 0.1–0.9)
MONO%: 8.9 % (ref 0.0–14.0)
NEUT#: 7.4 10*3/uL — ABNORMAL HIGH (ref 1.5–6.5)
NEUT%: 78.1 % — AB (ref 39.0–75.0)
PLATELETS: 412 10*3/uL — AB (ref 140–400)
RBC: 5.21 10*6/uL (ref 4.20–5.82)
RDW: 17 % — ABNORMAL HIGH (ref 11.0–14.6)
Retic %: 1.45 % (ref 0.80–1.80)
Retic Ct Abs: 75.55 10*3/uL (ref 34.80–93.90)
WBC: 9.5 10*3/uL (ref 4.0–10.3)
lymph#: 0.9 10*3/uL (ref 0.9–3.3)
nRBC: 0 % (ref 0–0)

## 2015-12-02 MED ORDER — HYDROXYUREA 500 MG PO CAPS: 500.0000 mg | ORAL_CAPSULE | ORAL | Status: DC

## 2015-12-02 NOTE — Telephone Encounter (Signed)
Hi Jordan Vazquez, °I hope you are well! This patient will need Infusion Appts. °Thank You very much!!! °

## 2015-12-02 NOTE — Progress Notes (Signed)
[name]  HEMATOLOGY ONCOLOGY PROGRESS NOTE  Date of service:  .12/02/2015  Patient Care Team: [name], MD as PCP - General (Internal Medicine)  Diagnosis: Essential thrombocytosis  Current Treatment: Hydroxyurea 500 mg alternating with 800mg  daily.  INTERVAL HISTORY:  Jordan Vazquez was here to follow up on his Jak2 positive essential thrombocytosis. He has been taking his hydroxyurea as prescribed and has been quite compliant with this. No new acute concerns. No new evidence of weakness or arterial thromboembolism. No mouth sores. No infections. No fevers or chills. No skin changes. Platelets today are in the low 400k range and the patient is agreeable to increase his hydroxyurea dose a little to get to below 400k platelets.  REVIEW OF SYSTEMS:    10 Point review of systems of done and is negative except as noted above.  Past Medical History  Diagnosis Date  . Hypertension   . 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     . Past Surgical History  Procedure Laterality Date  . Hernia repair      right inguinal hernia surgery   . Joint replacement      left knee 5/12, right TKA 10/12   . Cardiac catheterization      16 years ago negative   . I&d knee with poly exchange  12/06/2011    Procedure: IRRIGATION AND DEBRIDEMENT KNEE WITH POLY EXCHANGE;  Surgeon: Mauri Pole, MD;  Location: [facility];  Service: Orthopedics;  Laterality: Right;  Right Total Knee Irrigation and Debridement with Poly Exchange    . Social History  Substance Use Topics  . Smoking status: Former Smoker -- 0.25 packs/day    Types: Cigarettes    Quit date: 11/16/1963  . Smokeless tobacco: Never Used  . Alcohol Use: 1.2 oz/week    2 Cans of beer per week    ALLERGIES:  is allergic to niacin and related.  MEDICATIONS:  Current Outpatient Prescriptions  Medication Sig  Dispense Refill  . aspirin 325 MG tablet Take 325 mg by mouth daily.     [name] atenolol (TENORMIN) 50 MG tablet TK 1 T PO QD.  3  . atorvastatin (LIPITOR) 40 MG tablet Take 40 mg by mouth every morning.    [name] b complex vitamins capsule Take 1 capsule by mouth daily. 60 capsule 6  . chlorthalidone (HYGROTON) 25 MG tablet TK 1 T PO EVERY MORNING  3  . Cholecalciferol (VITAMIN D-3) 1000 UNITS CAPS Take 1,000 Units by mouth daily.    . hydroxyurea (HYDREA) 500 MG capsule Take 1 capsule (500 mg total) by mouth See admin instructions. Take 500 mg alternately with 1000 mg every other day. Take with food. 60 capsule 2  . Multiple Vitamin (MULITIVITAMIN WITH MINERALS) TABS Take 2 tablets by mouth daily.     . Omega-3 Fatty Acids (SEA-OMEGA 30 PO) Take 1 capsule by mouth daily.    [name] oxyCODONE (OXY IR/ROXICODONE) 5 MG immediate release tablet TK 1.5 TS PO Q 6 H PRN  0  . tiZANidine (ZANAFLEX) 2 MG tablet TK 1 T PO QID PRN  0   No current facility-administered medications for this visit.    PHYSICAL EXAMINATION: ECOG PERFORMANCE STATUS: 1 - Symptomatic but completely ambulatory  . Filed Vitals:   12/02/15 0838  BP: 129/72  Pulse: 50  Temp: 98 F (36.7 C)  Resp: 17  Filed Weights   12/02/15 0838  Weight: 221 lb 14.4 oz (100.653 kg)   .Body mass index is 31.38 kg/(m^2).  GENERAL:alert, in no acute distress and comfortable SKIN: skin color, texture, turgor are normal, no rashes or significant lesions EYES: normal, conjunctiva are pink and non-injected, sclera clear OROPHARYNX:no exudate, no erythema and lips, buccal mucosa, and tongue normal  NECK: supple, no JVD, thyroid normal size, non-tender, without nodularity LYMPH:  no palpable lymphadenopathy in the cervical, axillary or inguinal LUNGS: clear to auscultation with normal respiratory effort HEART: regular rate & rhythm,  no murmurs and no lower extremity edema ABDOMEN: abdomen soft, non-tender, normoactive bowel sounds    Musculoskeletal: no cyanosis of digits and no clubbing  PSYCH: alert & oriented x 3 with fluent speech NEURO: no focal motor/sensory deficits  LABORATORY DATA:   I have reviewed the data as listed  . CBC Latest Ref Rng 12/02/2015 11/03/2015 10/06/2015  WBC 4.0 - 10.3 10e3/uL 9.5 10.1 11.1(H)  Hemoglobin 13.0 - 17.1 g/dL 16.1 16.5 16.1  Hematocrit 38.4 - 49.9 % 47.9 49.5 48.2  Platelets 140 - 400 10e3/uL 412(H) 485(H) 560(H)    . CMP Latest Ref Rng 12/02/2015 11/03/2015 10/06/2015  Glucose 70 - 140 mg/dl 111 77 102  BUN 7.0 - 26.0 mg/dL 21.2 21.8 21.1  Creatinine 0.7 - 1.3 mg/dL 1.1 1.1 1.1  Sodium 136 - 145 mEq/L 139 140 138  Potassium 3.5 - 5.1 mEq/L 4.5 4.5 4.9  Chloride 96 - 112 mEq/L - - -  CO2 22 - 29 mEq/L 30(H) 28 29  Calcium 8.4 - 10.4 mg/dL 9.3 9.8 9.8  Total Protein 6.4 - 8.3 g/dL 7.4 7.8 7.6  Total Bilirubin 0.20 - 1.20 mg/dL 0.87 0.74 0.65  Alkaline Phos 40 - 150 U/L 67 75 82  AST 5 - 34 U/L 37(H) 33 35(H)  ALT 0 - 55 U/L 41 36 37     RADIOGRAPHIC STUDIES: I have personally reviewed the radiological images as listed and agreed with the findings in the report. No results found.  ASSESSMENT & PLAN: '  70 year old Caucasian male with   #1  Jak2 positive Essential Thrombocytosis. No previous history of thrombosis. However his age about 42 and history of hypertension with him at high risk for thrombotic events. Platelet counts are down from the 600s to the mid-500s on 250 mg daily of hydroxyurea and is now down to 412k on  Hydroxyurea 500 mg alternating with 800mg  daily. No evidence of thrombosis. Plan -Patient reports no toxicities from his hydroxyurea. Platelet counts are continuing to improve are nearly at the goal goal of less than 400k -We shall increase his hydroxyurea to 500 mg alternating with 1000mg  every other day. - continue aspirin 81 mg by mouth daily. ---Continue follow-up with primary care physician for other medical issues.   RTC with Jordan Vazquez  in 8 weeks with cbc, cmp. Earlier if any new concerns  I spent 15 minutes counseling the patient face to face. The total time spent in the appointment was 15 minutes and more than 50% was on counseling and direct patient cares.    Jordan Lone MD [place] AAHIVMS [facility] Hematology/Oncology Physician [facility]  (Office):       [phone] (Work cell):  [phone] (Fax):           [phone]

## 2015-12-15 ENCOUNTER — Other Ambulatory Visit: Payer: Self-pay | Admitting: Hematology

## 2016-01-12 DIAGNOSIS — I10 Essential (primary) hypertension: Secondary | ICD-10-CM | POA: Diagnosis not present

## 2016-02-02 ENCOUNTER — Telehealth: Payer: Self-pay | Admitting: Hematology

## 2016-02-02 ENCOUNTER — Encounter: Payer: Self-pay | Admitting: Hematology

## 2016-02-02 ENCOUNTER — Ambulatory Visit (HOSPITAL_BASED_OUTPATIENT_CLINIC_OR_DEPARTMENT_OTHER): Payer: Medicare Other | Admitting: Hematology

## 2016-02-02 ENCOUNTER — Other Ambulatory Visit (HOSPITAL_BASED_OUTPATIENT_CLINIC_OR_DEPARTMENT_OTHER): Payer: Medicare Other

## 2016-02-02 VITALS — BP 148/63 | HR 51 | Temp 97.9°F | Resp 18 | Ht 70.5 in | Wt 222.7 lb

## 2016-02-02 DIAGNOSIS — D473 Essential (hemorrhagic) thrombocythemia: Secondary | ICD-10-CM | POA: Diagnosis not present

## 2016-02-02 LAB — COMPREHENSIVE METABOLIC PANEL
ALBUMIN: 4.2 g/dL (ref 3.5–5.0)
ALK PHOS: 83 U/L (ref 40–150)
ALT: 50 U/L (ref 0–55)
AST: 38 U/L — AB (ref 5–34)
Anion Gap: 7 mEq/L (ref 3–11)
BUN: 20.1 mg/dL (ref 7.0–26.0)
CALCIUM: 9.8 mg/dL (ref 8.4–10.4)
CHLORIDE: 101 meq/L (ref 98–109)
CO2: 32 mEq/L — ABNORMAL HIGH (ref 22–29)
CREATININE: 1 mg/dL (ref 0.7–1.3)
EGFR: 72 mL/min/{1.73_m2} — ABNORMAL LOW (ref >90)
GLUCOSE: 88 mg/dL (ref 70–140)
Potassium: 4.3 mEq/L (ref 3.5–5.1)
SODIUM: 141 meq/L (ref 136–145)
Total Bilirubin: 0.69 mg/dL (ref 0.20–1.20)
Total Protein: 7.9 g/dL (ref 6.4–8.3)

## 2016-02-02 LAB — CBC & DIFF AND RETIC
BASO%: 0.4 % (ref 0.0–2.0)
BASOS ABS: 0 10*3/uL (ref 0.0–0.1)
EOS ABS: 0.3 10*3/uL (ref 0.0–0.5)
EOS%: 3.1 % (ref 0.0–7.0)
HEMATOCRIT: 48.6 % (ref 38.4–49.9)
HEMOGLOBIN: 16.5 g/dL (ref 13.0–17.1)
Immature Retic Fract: 12.1 % — ABNORMAL HIGH (ref 3.00–10.60)
LYMPH#: 0.9 10*3/uL (ref 0.9–3.3)
LYMPH%: 9.7 % — ABNORMAL LOW (ref 14.0–49.0)
MCH: 32.7 pg (ref 27.2–33.4)
MCHC: 34 g/dL (ref 32.0–36.0)
MCV: 96.2 fL (ref 79.3–98.0)
MONO#: 1.1 10*3/uL — AB (ref 0.1–0.9)
MONO%: 11.8 % (ref 0.0–14.0)
NEUT%: 75 % (ref 39.0–75.0)
NEUTROS ABS: 6.8 10*3/uL — AB (ref 1.5–6.5)
Platelets: 410 10*3/uL — ABNORMAL HIGH (ref 140–400)
RBC: 5.05 10*6/uL (ref 4.20–5.82)
RDW: 15.8 % — AB (ref 11.0–14.6)
RETIC %: 1.62 % (ref 0.80–1.80)
RETIC CT ABS: 81.81 10*3/uL (ref 34.80–93.90)
WBC: 9.1 10*3/uL (ref 4.0–10.3)

## 2016-02-02 MED ORDER — HYDROXYUREA 500 MG PO CAPS: 500.0000 mg | ORAL_CAPSULE | ORAL | Status: DC

## 2016-02-02 NOTE — Telephone Encounter (Signed)
per pof to sch pt appt*pt got copy of avs

## 2016-02-02 NOTE — Telephone Encounter (Signed)
per pof to sch pt appt-gave pt copy of avs °

## 2016-02-03 DIAGNOSIS — M47817 Spondylosis without myelopathy or radiculopathy, lumbosacral region: Secondary | ICD-10-CM | POA: Diagnosis not present

## 2016-02-03 DIAGNOSIS — M150 Primary generalized (osteo)arthritis: Secondary | ICD-10-CM | POA: Diagnosis not present

## 2016-02-03 DIAGNOSIS — M62830 Muscle spasm of back: Secondary | ICD-10-CM | POA: Diagnosis not present

## 2016-02-03 DIAGNOSIS — Z79891 Long term (current) use of opiate analgesic: Secondary | ICD-10-CM | POA: Diagnosis not present

## 2016-02-03 DIAGNOSIS — G894 Chronic pain syndrome: Secondary | ICD-10-CM | POA: Diagnosis not present

## 2016-02-04 NOTE — Progress Notes (Signed)
Jordan Vazquez  HEMATOLOGY ONCOLOGY PROGRESS NOTE  Date of service:  .02/02/2016   Patient Care Team: Jordan Orn, MD as PCP - General (Internal Medicine)  Diagnosis: Essential thrombocytosis  Current Treatment: Hydroxyurea 500 mg alternating with 800mg  daily.  INTERVAL HISTORY:  [name] was here to follow up on his Jak2 positive essential thrombocytosis. He notes that he is tolerating the hydroxyurea well and has no acute new concerns.  He continues to be on 1000 mg alternating with 500 mg and has been on this last 3 weeks.  Platelet counts today are 410k and patient wishes to remain on the same dose of hydroxyurea and reassess on next visit.  No new headaches/leg ulcers/ skin rashes or any other new concerns. He is in good spirits.  REVIEW OF SYSTEMS:    10 Point review of systems of done and is negative except as noted above.  Past Medical History  Diagnosis Date  . Hypertension   . 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     . Past Surgical History  Procedure Laterality Date  . Hernia repair      right inguinal hernia surgery   . Joint replacement      left knee 5/12, right TKA 10/12   . Cardiac catheterization      16 years ago negative   . I&d knee with poly exchange  12/06/2011    Procedure: IRRIGATION AND DEBRIDEMENT KNEE WITH POLY EXCHANGE;  Surgeon: Jordan Pole, MD;  Location: [facility];  Service: Orthopedics;  Laterality: Right;  Right Total Knee Irrigation and Debridement with Poly Exchange    . Social History  Substance Use Topics  . Smoking status: Former Smoker -- 0.25 packs/day    Types: Cigarettes    Quit date: 11/16/1963  . Smokeless tobacco: Never Used  . Alcohol Use: 1.2 oz/week    2 Cans of beer per week    ALLERGIES:  is allergic to niacin and related.  MEDICATIONS:  Current Outpatient Prescriptions  Medication Sig  Dispense Refill  . aspirin 325 MG tablet Take 325 mg by mouth daily.     Jordan Vazquez atenolol (TENORMIN) 50 MG tablet TK 1 T PO QD.  3  . atorvastatin (LIPITOR) 40 MG tablet Take 40 mg by mouth every morning.    . B Complex CAPS TRK 1 CAPSULE BY MOUTH EVERY DAY 100 capsule 0  . chlorthalidone (HYGROTON) 25 MG tablet TK 1 T PO EVERY MORNING  3  . Cholecalciferol (VITAMIN D-3) 1000 UNITS CAPS Take 1,000 Units by mouth daily.    . hydroxyurea (HYDREA) 500 MG capsule Take 1 capsule (500 mg total) by mouth See admin instructions. Take 500 mg alternately with 1000 mg every other day. Take with food. 60 capsule 5  . Multiple Vitamin (MULITIVITAMIN WITH MINERALS) TABS Take 2 tablets by mouth daily.     . Omega-3 Fatty Acids (SEA-OMEGA 30 PO) Take 1 capsule by mouth daily.    Jordan Vazquez oxyCODONE (OXY IR/ROXICODONE) 5 MG immediate release tablet TK 1.5 TS PO Q 6 H PRN  0  . tiZANidine (ZANAFLEX) 2 MG tablet TK 1 T PO QID PRN  0   No current facility-administered medications for this visit.    PHYSICAL EXAMINATION: ECOG PERFORMANCE STATUS: 1 - Symptomatic but completely ambulatory  . Filed Vitals:   02/02/16 0825  BP: 148/63  Pulse: 51  Temp: 97.9 F (36.6 C)  Resp: 18    Filed Weights   02/02/16 0825  Weight: 222 lb 11.2 oz (101.016 kg)   .Body mass index is 31.49 kg/(m^2).  GENERAL:alert, in no acute distress and comfortable SKIN: skin color, texture, turgor are normal, no rashes or significant lesions EYES: normal, conjunctiva are pink and non-injected, sclera clear OROPHARYNX:no exudate, no erythema and lips, buccal mucosa, and tongue normal  NECK: supple, no JVD, thyroid normal size, non-tender, without nodularity LYMPH:  no palpable lymphadenopathy in the cervical, axillary or inguinal LUNGS: clear to auscultation with normal respiratory effort HEART: regular rate & rhythm,  no murmurs and no lower extremity edema ABDOMEN: abdomen soft, non-tender, normoactive bowel sounds  Musculoskeletal: no  cyanosis of digits and no clubbing  PSYCH: alert & oriented x 3 with fluent speech NEURO: no focal motor/sensory deficits  LABORATORY DATA:   I have reviewed the data as listed  . CBC Latest Ref Rng 02/02/2016 12/02/2015 11/03/2015  WBC 4.0 - 10.3 10e3/uL 9.1 9.5 10.1  Hemoglobin 13.0 - 17.1 g/dL 16.5 16.1 16.5  Hematocrit 38.4 - 49.9 % 48.6 47.9 49.5  Platelets 140 - 400 10e3/uL 410(H) 412(H) 485(H)    . CMP Latest Ref Rng 02/02/2016 12/02/2015 11/03/2015  Glucose 70 - 140 mg/dl 88 111 77  BUN 7.0 - 26.0 mg/dL 20.1 21.2 21.8  Creatinine 0.7 - 1.3 mg/dL 1.0 1.1 1.1  Sodium 136 - 145 mEq/L 141 139 140  Potassium 3.5 - 5.1 mEq/L 4.3 4.5 4.5  CO2 22 - 29 mEq/L 32(H) 30(H) 28  Calcium 8.4 - 10.4 mg/dL 9.8 9.3 9.8  Total Protein 6.4 - 8.3 g/dL 7.9 7.4 7.8  Total Bilirubin 0.20 - 1.20 mg/dL 0.69 0.87 0.74  Alkaline Phos 40 - 150 U/L 83 67 75  AST 5 - 34 U/L 38(H) 37(H) 33  ALT 0 - 55 U/L 50 41 36     RADIOGRAPHIC STUDIES: I have personally reviewed the radiological images as listed and agreed with the findings in the report. No results found.  ASSESSMENT & PLAN: '  70 year old Caucasian male with   #1  Jak2 positive Essential Thrombocytosis. No previous history of thrombosis. However his age about 39 and history of hypertension places him at high risk for thrombotic events. Platelet counts are down from the 600s to the mid-500s on 250 mg daily of hydroxyurea and is now down to 410k on  Hydroxyurea 500 mg alternating with 1000mg  daily. No evidence of thrombosis. Plan -Patient reports no toxicities from his hydroxyurea.  -continue on hydroxyurea to 500 mg alternating with 1000mg  every other day next visit to reassess platelet counts. -continue daily aspirin ---Continue follow-up with primary care physician for other medical issues.   RTC with Dr Jordan Vazquez in 8 weeks with cbc, cmp. Earlier if any new concerns  I spent 15 minutes counseling the patient face to face. The total time  spent in the appointment was 15 minutes and more than 50% was on counseling and direct patient cares.    Jordan Lone MD [place] AAHIVMS [facility] Hematology/Oncology Physician [facility]  (Office):       [phone] (Work cell):  [phone] (Fax):           [phone]

## 2016-03-30 DIAGNOSIS — M62830 Muscle spasm of back: Secondary | ICD-10-CM | POA: Diagnosis not present

## 2016-03-30 DIAGNOSIS — G894 Chronic pain syndrome: Secondary | ICD-10-CM | POA: Diagnosis not present

## 2016-03-30 DIAGNOSIS — M47817 Spondylosis without myelopathy or radiculopathy, lumbosacral region: Secondary | ICD-10-CM | POA: Diagnosis not present

## 2016-03-30 DIAGNOSIS — M150 Primary generalized (osteo)arthritis: Secondary | ICD-10-CM | POA: Diagnosis not present

## 2016-04-05 ENCOUNTER — Other Ambulatory Visit: Payer: Medicare Other

## 2016-04-05 ENCOUNTER — Ambulatory Visit: Payer: Medicare Other | Admitting: Hematology

## 2016-05-04 ENCOUNTER — Encounter: Payer: Self-pay | Admitting: Hematology

## 2016-05-04 ENCOUNTER — Other Ambulatory Visit (HOSPITAL_BASED_OUTPATIENT_CLINIC_OR_DEPARTMENT_OTHER): Payer: Medicare Other

## 2016-05-04 ENCOUNTER — Telehealth: Payer: Self-pay | Admitting: Hematology

## 2016-05-04 ENCOUNTER — Ambulatory Visit (HOSPITAL_BASED_OUTPATIENT_CLINIC_OR_DEPARTMENT_OTHER): Payer: Medicare Other | Admitting: Hematology

## 2016-05-04 VITALS — BP 133/66 | HR 51 | Temp 98.3°F | Resp 18 | Ht 70.5 in | Wt 225.3 lb

## 2016-05-04 DIAGNOSIS — D473 Essential (hemorrhagic) thrombocythemia: Secondary | ICD-10-CM | POA: Diagnosis not present

## 2016-05-04 LAB — COMPREHENSIVE METABOLIC PANEL
ALT: 51 U/L (ref 0–55)
ANION GAP: 9 meq/L (ref 3–11)
AST: 42 U/L — AB (ref 5–34)
Albumin: 4.1 g/dL (ref 3.5–5.0)
Alkaline Phosphatase: 68 U/L (ref 40–150)
BUN: 23.1 mg/dL (ref 7.0–26.0)
CHLORIDE: 101 meq/L (ref 98–109)
CO2: 28 meq/L (ref 22–29)
CREATININE: 1.2 mg/dL (ref 0.7–1.3)
Calcium: 9.5 mg/dL (ref 8.4–10.4)
EGFR: 64 mL/min/{1.73_m2} — ABNORMAL LOW (ref >90)
Glucose: 92 mg/dl (ref 70–140)
Potassium: 4.3 mEq/L (ref 3.5–5.1)
Sodium: 139 mEq/L (ref 136–145)
Total Bilirubin: 1.02 mg/dL (ref 0.20–1.20)
Total Protein: 7.7 g/dL (ref 6.4–8.3)

## 2016-05-04 LAB — CBC & DIFF AND RETIC
BASO%: 0.5 % (ref 0.0–2.0)
Basophils Absolute: 0 10*3/uL (ref 0.0–0.1)
EOS%: 2.8 % (ref 0.0–7.0)
Eosinophils Absolute: 0.2 10*3/uL (ref 0.0–0.5)
HCT: 47.1 % (ref 38.4–49.9)
HGB: 16.1 g/dL (ref 13.0–17.1)
Immature Retic Fract: 7.9 % (ref 3.00–10.60)
LYMPH#: 1.2 10*3/uL (ref 0.9–3.3)
LYMPH%: 14.1 % (ref 14.0–49.0)
MCH: 33.5 pg — ABNORMAL HIGH (ref 27.2–33.4)
MCHC: 34.2 g/dL (ref 32.0–36.0)
MCV: 98.1 fL — ABNORMAL HIGH (ref 79.3–98.0)
MONO#: 0.9 10*3/uL (ref 0.1–0.9)
MONO%: 10.2 % (ref 0.0–14.0)
NEUT%: 72.4 % (ref 39.0–75.0)
NEUTROS ABS: 6.2 10*3/uL (ref 1.5–6.5)
NRBC: 0 % (ref 0–0)
Platelets: 399 10*3/uL (ref 140–400)
RBC: 4.8 10*6/uL (ref 4.20–5.82)
RDW: 14.7 % — ABNORMAL HIGH (ref 11.0–14.6)
RETIC %: 1.61 % (ref 0.80–1.80)
RETIC CT ABS: 77.28 10*3/uL (ref 34.80–93.90)
WBC: 8.6 10*3/uL (ref 4.0–10.3)

## 2016-05-04 NOTE — Progress Notes (Signed)
[name]  HEMATOLOGY ONCOLOGY PROGRESS NOTE  Date of service:  05/04/2016  Patient Care Team: [name], MD as PCP - General (Internal Medicine)  Diagnosis: Essential thrombocytosis  Current Treatment: Hydroxyurea 500 mg alternating with 800mg  daily.  INTERVAL HISTORY:  Jordan Vazquez was here to follow up on his Jak2 positive essential thrombocytosis.  He has no acute new concerns. No symptoms suggestive of venous/arterial thrombosis. No issues with infections. No problems tolerating the hydroxyurea. No other side effects from the medication. No new headaches/leg ulcers/ skin rashes or any other new concerns. He is in good spirits.  REVIEW OF SYSTEMS:    10 Point review of systems of done and is negative except as noted above.  Past Medical History  Diagnosis Date  . Hypertension   . 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     . Past Surgical History  Procedure Laterality Date  . Hernia repair      right inguinal hernia surgery   . Joint replacement      left knee 5/12, right TKA 10/12   . Cardiac catheterization      16 years ago negative   . I&d knee with poly exchange  12/06/2011    Procedure: IRRIGATION AND DEBRIDEMENT KNEE WITH POLY EXCHANGE;  Surgeon: Mauri Pole, MD;  Location: [facility];  Service: Orthopedics;  Laterality: Right;  Right Total Knee Irrigation and Debridement with Poly Exchange    . Social History  Substance Use Topics  . Smoking status: Former Smoker -- 0.25 packs/day    Types: Cigarettes    Quit date: 11/16/1963  . Smokeless tobacco: Never Used  . Alcohol Use: 1.2 oz/week    2 Cans of beer per week    ALLERGIES:  is allergic to niacin and related.  MEDICATIONS:  Current Outpatient Prescriptions  Medication Sig Dispense Refill  . aspirin 325 MG tablet Take 325 mg by mouth daily.     [name] atenolol (TENORMIN) 50 MG  tablet TK 1 T PO QD.  3  . atorvastatin (LIPITOR) 40 MG tablet Take 40 mg by mouth every morning.    . B Complex CAPS TRK 1 CAPSULE BY MOUTH EVERY DAY 100 capsule 0  . chlorthalidone (HYGROTON) 25 MG tablet TK 1 T PO EVERY MORNING  3  . Cholecalciferol (VITAMIN D-3) 1000 UNITS CAPS Take 1,000 Units by mouth daily.    . hydroxyurea (HYDREA) 500 MG capsule Take 1 capsule (500 mg total) by mouth See admin instructions. Take 500 mg alternately with 1000 mg every other day. Take with food. 60 capsule 5  . Multiple Vitamin (MULITIVITAMIN WITH MINERALS) TABS Take 2 tablets by mouth daily.     . Omega-3 Fatty Acids (SEA-OMEGA 30 PO) Take 1 capsule by mouth daily.    [name] oxyCODONE (OXY IR/ROXICODONE) 5 MG immediate release tablet TK 1.5 TS PO Q 6 H PRN  0  . tiZANidine (ZANAFLEX) 2 MG tablet TK 1 T PO QID PRN  0   No current facility-administered medications for this visit.    PHYSICAL EXAMINATION: ECOG PERFORMANCE STATUS: 1 - Symptomatic but completely ambulatory  . Filed Vitals:   05/04/16 0847  BP: 133/66  Pulse: 51  Temp: 98.3 F (36.8 C)  Resp: 18    Filed Weights   05/04/16 0847  Weight: 225 lb 4.8 oz (102.195 kg)   .Body mass  index is 31.86 kg/(m^2).  GENERAL:alert, in no acute distress and comfortable SKIN: skin color, texture, turgor are normal, no rashes or significant lesions EYES: normal, conjunctiva are pink and non-injected, sclera clear OROPHARYNX:no exudate, no erythema and lips, buccal mucosa, and tongue normal  NECK: supple, no JVD, thyroid normal size, non-tender, without nodularity LYMPH:  no palpable lymphadenopathy in the cervical, axillary or inguinal LUNGS: clear to auscultation with normal respiratory effort HEART: regular rate & rhythm,  no murmurs and no lower extremity edema ABDOMEN: abdomen soft, non-tender, normoactive bowel sounds  Musculoskeletal: no cyanosis of digits and no clubbing  PSYCH: alert & oriented x 3 with fluent speech NEURO: no focal  motor/sensory deficits  LABORATORY DATA:   I have reviewed the data as listed  . CBC Latest Ref Rng 05/04/2016 02/02/2016 12/02/2015  WBC 4.0 - 10.3 10e3/uL 8.6 9.1 9.5  Hemoglobin 13.0 - 17.1 g/dL 16.1 16.5 16.1  Hematocrit 38.4 - 49.9 % 47.1 48.6 47.9  Platelets 140 - 400 10e3/uL 399 410(H) 412(H)    . CMP Latest Ref Rng 05/04/2016 02/02/2016 12/02/2015  Glucose 70 - 140 mg/dl 92 88 111  BUN 7.0 - 26.0 mg/dL 23.1 20.1 21.2  Creatinine 0.7 - 1.3 mg/dL 1.2 1.0 1.1  Sodium 136 - 145 mEq/L 139 141 139  Potassium 3.5 - 5.1 mEq/L 4.3 4.3 4.5  CO2 22 - 29 mEq/L 28 32(H) 30(H)  Calcium 8.4 - 10.4 mg/dL 9.5 9.8 9.3  Total Protein 6.4 - 8.3 g/dL 7.7 7.9 7.4  Total Bilirubin 0.20 - 1.20 mg/dL 1.02 0.69 0.87  Alkaline Phos 40 - 150 U/L 68 83 67  AST 5 - 34 U/L 42(H) 38(H) 37(H)  ALT 0 - 55 U/L 51 50 41     RADIOGRAPHIC STUDIES: I have personally reviewed the radiological images as listed and agreed with the findings in the report. No results found.  ASSESSMENT & PLAN: '  70 year old Caucasian male with   #1  Jak2 positive Essential Thrombocytosis. No previous history of thrombosis. However his age about 70 and history of hypertension places him at high risk for thrombotic events.  Platelet counts have normalized at has current hydroxyurea dose. No evidence of thrombosis. He is tolerating hydroxyurea without any other acute issues. Plan -Patient reports no toxicities from his hydroxyurea and his labs are stable. -continue on hydroxyurea to 500 mg alternating with 1000mg  every other day. -continue daily aspirin -We discussed need for aggressive sun protection due to increased risk of photosensitivity/getting sunburnt while on hydroxyurea. ---Continue follow-up with primary care physician for other medical issues.   RTC with Dr Irene Limbo in 12 weeks with cbc, cmp. Earlier if any new concerns  I spent 15 minutes counseling the patient face to face. The total time spent in the appointment  was 20 minutes and more than 50% was on counseling and direct patient cares.    Jordan Lone MD [place] AAHIVMS [facility] Hematology/Oncology Physician [facility]  (Office):       [phone] (Work cell):  [phone] (Fax):           [phone]

## 2016-05-04 NOTE — Telephone Encounter (Signed)
per pof to sch pt appt-gave pt copy of avs °

## 2016-05-31 DIAGNOSIS — G894 Chronic pain syndrome: Secondary | ICD-10-CM | POA: Diagnosis not present

## 2016-05-31 DIAGNOSIS — M150 Primary generalized (osteo)arthritis: Secondary | ICD-10-CM | POA: Diagnosis not present

## 2016-05-31 DIAGNOSIS — M47817 Spondylosis without myelopathy or radiculopathy, lumbosacral region: Secondary | ICD-10-CM | POA: Diagnosis not present

## 2016-05-31 DIAGNOSIS — M62830 Muscle spasm of back: Secondary | ICD-10-CM | POA: Diagnosis not present

## 2016-07-15 DIAGNOSIS — Z1389 Encounter for screening for other disorder: Secondary | ICD-10-CM | POA: Diagnosis not present

## 2016-07-15 DIAGNOSIS — Z0000 Encounter for general adult medical examination without abnormal findings: Secondary | ICD-10-CM | POA: Diagnosis not present

## 2016-07-15 DIAGNOSIS — I10 Essential (primary) hypertension: Secondary | ICD-10-CM | POA: Diagnosis not present

## 2016-07-15 DIAGNOSIS — Z23 Encounter for immunization: Secondary | ICD-10-CM | POA: Diagnosis not present

## 2016-07-15 DIAGNOSIS — E7800 Pure hypercholesterolemia, unspecified: Secondary | ICD-10-CM | POA: Diagnosis not present

## 2016-07-15 DIAGNOSIS — Z125 Encounter for screening for malignant neoplasm of prostate: Secondary | ICD-10-CM | POA: Diagnosis not present

## 2016-08-02 DIAGNOSIS — M62830 Muscle spasm of back: Secondary | ICD-10-CM | POA: Diagnosis not present

## 2016-08-02 DIAGNOSIS — M47817 Spondylosis without myelopathy or radiculopathy, lumbosacral region: Secondary | ICD-10-CM | POA: Diagnosis not present

## 2016-08-02 DIAGNOSIS — M150 Primary generalized (osteo)arthritis: Secondary | ICD-10-CM | POA: Diagnosis not present

## 2016-08-02 DIAGNOSIS — Z79891 Long term (current) use of opiate analgesic: Secondary | ICD-10-CM | POA: Diagnosis not present

## 2016-08-02 DIAGNOSIS — G894 Chronic pain syndrome: Secondary | ICD-10-CM | POA: Diagnosis not present

## 2016-08-03 ENCOUNTER — Other Ambulatory Visit (HOSPITAL_BASED_OUTPATIENT_CLINIC_OR_DEPARTMENT_OTHER): Payer: Medicare Other

## 2016-08-03 ENCOUNTER — Encounter: Payer: Self-pay | Admitting: Hematology

## 2016-08-03 ENCOUNTER — Telehealth: Payer: Self-pay | Admitting: Hematology

## 2016-08-03 ENCOUNTER — Ambulatory Visit (HOSPITAL_BASED_OUTPATIENT_CLINIC_OR_DEPARTMENT_OTHER): Payer: Medicare Other | Admitting: Hematology

## 2016-08-03 VITALS — BP 134/71 | HR 54 | Temp 97.7°F | Resp 18 | Ht 70.5 in | Wt 223.0 lb

## 2016-08-03 DIAGNOSIS — D473 Essential (hemorrhagic) thrombocythemia: Secondary | ICD-10-CM

## 2016-08-03 LAB — CBC & DIFF AND RETIC
BASO%: 0.6 % (ref 0.0–2.0)
Basophils Absolute: 0.1 10*3/uL (ref 0.0–0.1)
EOS ABS: 0.2 10*3/uL (ref 0.0–0.5)
EOS%: 2.2 % (ref 0.0–7.0)
HCT: 45.5 % (ref 38.4–49.9)
HEMOGLOBIN: 15.9 g/dL (ref 13.0–17.1)
IMMATURE RETIC FRACT: 3.7 % (ref 3.00–10.60)
LYMPH%: 13 % — ABNORMAL LOW (ref 14.0–49.0)
MCH: 34.4 pg — AB (ref 27.2–33.4)
MCHC: 34.9 g/dL (ref 32.0–36.0)
MCV: 98.5 fL — ABNORMAL HIGH (ref 79.3–98.0)
MONO#: 0.7 10*3/uL (ref 0.1–0.9)
MONO%: 9.2 % (ref 0.0–14.0)
NEUT%: 75 % (ref 39.0–75.0)
NEUTROS ABS: 6 10*3/uL (ref 1.5–6.5)
Platelets: 373 10*3/uL (ref 140–400)
RBC: 4.62 10*6/uL (ref 4.20–5.82)
RDW: 14.2 % (ref 11.0–14.6)
RETIC %: 1.59 % (ref 0.80–1.80)
Retic Ct Abs: 73.46 10*3/uL (ref 34.80–93.90)
WBC: 8 10*3/uL (ref 4.0–10.3)
lymph#: 1 10*3/uL (ref 0.9–3.3)

## 2016-08-03 LAB — COMPREHENSIVE METABOLIC PANEL
ALBUMIN: 3.8 g/dL (ref 3.5–5.0)
ALK PHOS: 83 U/L (ref 40–150)
ALT: 49 U/L (ref 0–55)
AST: 38 U/L — AB (ref 5–34)
Anion Gap: 12 mEq/L — ABNORMAL HIGH (ref 3–11)
BUN: 24.9 mg/dL (ref 7.0–26.0)
CO2: 27 meq/L (ref 22–29)
Calcium: 9.3 mg/dL (ref 8.4–10.4)
Chloride: 101 mEq/L (ref 98–109)
Creatinine: 1 mg/dL (ref 0.7–1.3)
EGFR: 78 mL/min/{1.73_m2} — ABNORMAL LOW (ref >90)
GLUCOSE: 106 mg/dL (ref 70–140)
POTASSIUM: 4.3 meq/L (ref 3.5–5.1)
SODIUM: 140 meq/L (ref 136–145)
Total Bilirubin: 0.95 mg/dL (ref 0.20–1.20)
Total Protein: 7.7 g/dL (ref 6.4–8.3)

## 2016-08-03 NOTE — Progress Notes (Signed)
[name]  HEMATOLOGY ONCOLOGY PROGRESS NOTE  Date of service:  08/03/2016  Patient Care Team: [name], MD as PCP - General (Internal Medicine)  CC: f/u for ET  Diagnosis:  JAK2 Essential thrombocytosis  Current Treatment: Hydroxyurea 500 mg alternating with 1000mg  daily.  INTERVAL HISTORY:  Jordan Vazquez was here to follow up on his Jak2 positive essential thrombocytosis.  He has no acute new concerns. No symptoms suggestive of venous/arterial thrombosis. No issues with infections. No problems tolerating the hydroxyurea. No other side effects from the medication. No new headaches/leg ulcers/ skin rashes or any other new concerns. He is in good spirits. Notes that his primary care physician is trying to get him off the beta blockers and she is only on for blood pressure control. He has had some bradycardia and this is probably going to be helpful. He is down to 25 mg of atenolol and his heart rate is still in the mid 50s and blood pressure is controlled.  REVIEW OF SYSTEMS:    10 Point review of systems of done and is negative except as noted above.  Past Medical History:  Diagnosis Date  . 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   . Hypertension     Past Surgical History:  Procedure Laterality Date  . CARDIAC CATHETERIZATION     16 years ago negative   . HERNIA REPAIR     right inguinal hernia surgery   . I&D KNEE WITH POLY EXCHANGE  12/06/2011   Procedure: IRRIGATION AND DEBRIDEMENT KNEE WITH POLY EXCHANGE;  Surgeon: Mauri Pole, MD;  Location: [facility];  Service: Orthopedics;  Laterality: Right;  Right Total Knee Irrigation and Debridement with Poly Exchange  . JOINT REPLACEMENT     left knee 5/12, right TKA 10/12     Social History  Substance Use Topics  . Smoking status: Former Smoker    Packs/day: 0.25    Types: Cigarettes    Quit date:  11/16/1963  . Smokeless tobacco: Never Used  . Alcohol use 1.2 oz/week    2 Cans of beer per week    ALLERGIES:  is allergic to niacin and related.  MEDICATIONS:  Current Outpatient Prescriptions  Medication Sig Dispense Refill  . aspirin 325 MG tablet Take 325 mg by mouth daily.     [name] atorvastatin (LIPITOR) 40 MG tablet Take 40 mg by mouth every morning.    . B Complex CAPS TRK 1 CAPSULE BY MOUTH EVERY DAY 100 capsule 0  . chlorthalidone (HYGROTON) 25 MG tablet TK 1 T PO EVERY MORNING  3  . Cholecalciferol (VITAMIN D-3) 1000 UNITS CAPS Take 1,000 Units by mouth daily.    . hydroxyurea (HYDREA) 500 MG capsule Take 1 capsule (500 mg total) by mouth See admin instructions. Take 500 mg alternately with 1000 mg every other day. Take with food. 60 capsule 5  . Multiple Vitamin (MULITIVITAMIN WITH MINERALS) TABS Take 2 tablets by mouth daily.     . Omega-3 Fatty Acids (SEA-OMEGA 30 PO) Take 1 capsule by mouth daily.    [name] oxyCODONE (OXY IR/ROXICODONE) 5 MG immediate release tablet TK 1.5 TS PO Q 6 H PRN  0  . tiZANidine (ZANAFLEX) 2 MG tablet TK 1 T PO QID PRN  0   No current facility-administered medications for this visit.     PHYSICAL EXAMINATION: ECOG PERFORMANCE STATUS: 1 -  Symptomatic but completely ambulatory  . Vitals:   08/03/16 0837  BP: 134/71  Pulse: (!) 54  Resp: 18  Temp: 97.7 F (36.5 C)    Filed Weights   08/03/16 0837  Weight: 223 lb (101.2 kg)   .Body mass index is 31.54 kg/m.  GENERAL:alert, in no acute distress and comfortable SKIN: skin color, texture, turgor are normal, no rashes or significant lesions EYES: normal, conjunctiva are pink and non-injected, sclera clear OROPHARYNX:no exudate, no erythema and lips, buccal mucosa, and tongue normal  NECK: supple, no JVD, thyroid normal size, non-tender, without nodularity LYMPH:  no palpable lymphadenopathy in the cervical, axillary or inguinal LUNGS: clear to auscultation with normal respiratory  effort HEART: regular rate & rhythm,  no murmurs and no lower extremity edema ABDOMEN: abdomen soft, non-tender, normoactive bowel sounds  Musculoskeletal: no cyanosis of digits and no clubbing  PSYCH: alert & oriented x 3 with fluent speech NEURO: no focal motor/sensory deficits  LABORATORY DATA:   I have reviewed the data as listed  . CBC Latest Ref Rng & Units 08/03/2016 05/04/2016 02/02/2016  WBC 4.0 - 10.3 10e3/uL 8.0 8.6 9.1  Hemoglobin 13.0 - 17.1 g/dL 15.9 16.1 16.5  Hematocrit 38.4 - 49.9 % 45.5 47.1 48.6  Platelets 140 - 400 10e3/uL 373 399 410(H)    . CMP Latest Ref Rng & Units 08/03/2016 05/04/2016 02/02/2016  Glucose 70 - 140 mg/dl 106 92 88  BUN 7.0 - 26.0 mg/dL 24.9 23.1 20.1  Creatinine 0.7 - 1.3 mg/dL 1.0 1.2 1.0  Sodium 136 - 145 mEq/L 140 139 141  Potassium 3.5 - 5.1 mEq/L 4.3 4.3 4.3  Chloride 96 - 112 mEq/L - - -  CO2 22 - 29 mEq/L 27 28 32(H)  Calcium 8.4 - 10.4 mg/dL 9.3 9.5 9.8  Total Protein 6.4 - 8.3 g/dL 7.7 7.7 7.9  Total Bilirubin 0.20 - 1.20 mg/dL 0.95 1.02 0.69  Alkaline Phos 40 - 150 U/L 83 68 83  AST 5 - 34 U/L 38(H) 42(H) 38(H)  ALT 0 - 55 U/L 49 51 50    RADIOGRAPHIC STUDIES: I have personally reviewed the radiological images as listed and agreed with the findings in the report. No results found.  ASSESSMENT & PLAN: '  70 year old Caucasian male with   #1  Jak2 positive Essential Thrombocytosis.  No previous history of thrombosis. However his age about 70 and history of hypertension places him at high risk for thrombotic events.  Platelet counts have normalized at has current hydroxyurea dose. No evidence of thrombosis. He is tolerating hydroxyurea without any other acute issues. Plan -Patient reports no toxicities from his hydroxyurea and his labs are stable. -continue on hydroxyurea to 500 mg alternating with 1000mg  every other day. -continue daily aspirin -We discussed need for aggressive sun protection due to increased risk of  photosensitivity/getting sunburnt while on hydroxyurea. -Continue follow-up with primary care physician for other medical issues.  -reasonable to continue Multivitamin. No strong indication for B complex replacement.  RTC with Dr Irene Limbo in 12 weeks with cbc, cmp. Earlier if any new concerns  I spent 15 minutes counseling the patient face to face. The total time spent in the appointment was 20 minutes and more than 50% was on counseling and direct patient cares.    Sullivan Lone MD [place] AAHIVMS [facility] Hematology/Oncology Physician [facility]  (Office):       [phone] (Work cell):  [phone] (Fax):  [phone] 

## 2016-08-03 NOTE — Telephone Encounter (Signed)
Avs report and appointment schedule given to patient per 08/03/16 los. °

## 2016-09-27 DIAGNOSIS — M150 Primary generalized (osteo)arthritis: Secondary | ICD-10-CM | POA: Diagnosis not present

## 2016-09-27 DIAGNOSIS — M47817 Spondylosis without myelopathy or radiculopathy, lumbosacral region: Secondary | ICD-10-CM | POA: Diagnosis not present

## 2016-09-27 DIAGNOSIS — M62830 Muscle spasm of back: Secondary | ICD-10-CM | POA: Diagnosis not present

## 2016-09-27 DIAGNOSIS — G894 Chronic pain syndrome: Secondary | ICD-10-CM | POA: Diagnosis not present

## 2016-10-13 ENCOUNTER — Other Ambulatory Visit: Payer: Self-pay | Admitting: Hematology

## 2016-11-19 ENCOUNTER — Encounter: Payer: Self-pay | Admitting: Hematology

## 2016-11-19 ENCOUNTER — Ambulatory Visit (HOSPITAL_BASED_OUTPATIENT_CLINIC_OR_DEPARTMENT_OTHER): Payer: Medicare Other | Admitting: Hematology

## 2016-11-19 ENCOUNTER — Other Ambulatory Visit (HOSPITAL_BASED_OUTPATIENT_CLINIC_OR_DEPARTMENT_OTHER): Payer: Medicare Other

## 2016-11-19 ENCOUNTER — Telehealth: Payer: Self-pay | Admitting: Hematology

## 2016-11-19 VITALS — BP 137/81 | HR 71 | Temp 97.8°F | Resp 18 | Ht 70.5 in | Wt 229.7 lb

## 2016-11-19 DIAGNOSIS — D473 Essential (hemorrhagic) thrombocythemia: Secondary | ICD-10-CM

## 2016-11-19 LAB — COMPREHENSIVE METABOLIC PANEL
ALT: 79 U/L — AB (ref 0–55)
ANION GAP: 12 meq/L — AB (ref 3–11)
AST: 58 U/L — ABNORMAL HIGH (ref 5–34)
Albumin: 4.4 g/dL (ref 3.5–5.0)
Alkaline Phosphatase: 97 U/L (ref 40–150)
BILIRUBIN TOTAL: 0.77 mg/dL (ref 0.20–1.20)
BUN: 22.6 mg/dL (ref 7.0–26.0)
CHLORIDE: 101 meq/L (ref 98–109)
CO2: 28 meq/L (ref 22–29)
CREATININE: 1.1 mg/dL (ref 0.7–1.3)
Calcium: 9.6 mg/dL (ref 8.4–10.4)
EGFR: 70 mL/min/{1.73_m2} — ABNORMAL LOW (ref >90)
GLUCOSE: 114 mg/dL (ref 70–140)
Potassium: 4.1 mEq/L (ref 3.5–5.1)
SODIUM: 141 meq/L (ref 136–145)
TOTAL PROTEIN: 7.9 g/dL (ref 6.4–8.3)

## 2016-11-19 LAB — CBC & DIFF AND RETIC
BASO%: 0.4 % (ref 0.0–2.0)
Basophils Absolute: 0 10*3/uL (ref 0.0–0.1)
EOS%: 2.6 % (ref 0.0–7.0)
Eosinophils Absolute: 0.2 10*3/uL (ref 0.0–0.5)
HCT: 47.9 % (ref 38.4–49.9)
HGB: 16.5 g/dL (ref 13.0–17.1)
IMMATURE RETIC FRACT: 7.8 % (ref 3.00–10.60)
LYMPH#: 1.2 10*3/uL (ref 0.9–3.3)
LYMPH%: 14.2 % (ref 14.0–49.0)
MCH: 34.6 pg — ABNORMAL HIGH (ref 27.2–33.4)
MCHC: 34.4 g/dL (ref 32.0–36.0)
MCV: 100.4 fL — ABNORMAL HIGH (ref 79.3–98.0)
MONO#: 0.7 10*3/uL (ref 0.1–0.9)
MONO%: 8.4 % (ref 0.0–14.0)
NEUT%: 74.4 % (ref 39.0–75.0)
NEUTROS ABS: 6.1 10*3/uL (ref 1.5–6.5)
PLATELETS: 385 10*3/uL (ref 140–400)
RBC: 4.77 10*6/uL (ref 4.20–5.82)
RDW: 13.8 % (ref 11.0–14.6)
RETIC %: 1.35 % (ref 0.80–1.80)
RETIC CT ABS: 64.4 10*3/uL (ref 34.80–93.90)
WBC: 8.2 10*3/uL (ref 4.0–10.3)

## 2016-11-19 LAB — LACTATE DEHYDROGENASE: LDH: 262 U/L — ABNORMAL HIGH (ref 125–245)

## 2016-11-19 MED ORDER — HYDROXYUREA 500 MG PO CAPS: ORAL_CAPSULE | ORAL | 3 refills | Status: DC

## 2016-11-19 NOTE — Telephone Encounter (Signed)
Appointments scheduled per 11/19/16 los. Patient was given a copy of the AVS report and appointment schedule, per 11/19/16 los. °

## 2016-11-22 NOTE — Progress Notes (Signed)
[name]  HEMATOLOGY ONCOLOGY PROGRESS NOTE  Date of service:  11/19/2016  Patient Care Team: [name], MD as PCP - General (Internal Medicine)  CC: f/u for ET  Diagnosis:  JAK2 Essential thrombocytosis  Current Treatment: Hydroxyurea 500 mg alternating with 1000mg  daily.  INTERVAL HISTORY:  Mr. Cooling was here to follow up on his Jak2 positive essential thrombocytosis.  He has no acute new concerns. No symptoms suggestive of venous/arterial thrombosis. No issues with infections. No problems tolerating the hydroxyurea. No other side effects from the medication. No new headaches/leg ulcers/ skin rashes or any other new concerns.  REVIEW OF SYSTEMS:    10 Point review of systems of done and is negative except as noted above.  Past Medical History:  Diagnosis Date  . 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   . Hypertension     Past Surgical History:  Procedure Laterality Date  . CARDIAC CATHETERIZATION     16 years ago negative   . HERNIA REPAIR     right inguinal hernia surgery   . I&D KNEE WITH POLY EXCHANGE  12/06/2011   Procedure: IRRIGATION AND DEBRIDEMENT KNEE WITH POLY EXCHANGE;  Surgeon: Mauri Pole, MD;  Location: [facility];  Service: Orthopedics;  Laterality: Right;  Right Total Knee Irrigation and Debridement with Poly Exchange  . JOINT REPLACEMENT     left knee 5/12, right TKA 10/12     Social History  Substance Use Topics  . Smoking status: Former Smoker    Packs/day: 0.25    Types: Cigarettes    Quit date: 11/16/1963  . Smokeless tobacco: Never Used  . Alcohol use 1.2 oz/week    2 Cans of beer per week    ALLERGIES:  is allergic to niacin and related.  MEDICATIONS:  Current Outpatient Prescriptions  Medication Sig Dispense Refill  . aspirin 325 MG tablet Take 325 mg by mouth daily.     [name] atorvastatin (LIPITOR) 40 MG tablet  Take 40 mg by mouth every morning.    . chlorthalidone (HYGROTON) 25 MG tablet TK 1 T PO EVERY MORNING  3  . Cholecalciferol (VITAMIN D-3) 1000 UNITS CAPS Take 1,000 Units by mouth daily.    . hydroxyurea (HYDREA) 500 MG capsule TAKE 2 CAPSULES BY MOUTH ON ONE DAY THEN TAKE 1 CAPSULE BY MOUTH ON OTHER DAY AS DIRECTED. TAKE WITH FOOD 90 capsule 3  . Multiple Vitamin (MULITIVITAMIN WITH MINERALS) TABS Take 2 tablets by mouth daily.     . Omega-3 Fatty Acids (SEA-OMEGA 30 PO) Take 1 capsule by mouth daily.    [name] oxyCODONE (OXY IR/ROXICODONE) 5 MG immediate release tablet TK 1.5 TS PO Q 6 H PRN  0  . tiZANidine (ZANAFLEX) 2 MG tablet TK 1 T PO QID PRN  0   No current facility-administered medications for this visit.     PHYSICAL EXAMINATION: ECOG PERFORMANCE STATUS: 1 - Symptomatic but completely ambulatory  . Vitals:   11/19/16 0841  BP: 137/81  Pulse: 71  Resp: 18  Temp: 97.8 F (36.6 C)    Filed Weights   11/19/16 0841  Weight: 229 lb 11.2 oz (104.2 kg)   .Body mass index is 32.49 kg/m.  GENERAL:alert, in no acute distress and comfortable SKIN: skin color, texture, turgor are normal, no rashes or significant lesions EYES: normal, conjunctiva are pink and non-injected, sclera clear OROPHARYNX:no exudate,  no erythema and lips, buccal mucosa, and tongue normal  NECK: supple, no JVD, thyroid normal size, non-tender, without nodularity LYMPH:  no palpable lymphadenopathy in the cervical, axillary or inguinal LUNGS: clear to auscultation with normal respiratory effort HEART: regular rate & rhythm,  no murmurs and no lower extremity edema ABDOMEN: abdomen soft, non-tender, normoactive bowel sounds  Musculoskeletal: no cyanosis of digits and no clubbing  PSYCH: alert & oriented x 3 with fluent speech NEURO: no focal motor/sensory deficits  LABORATORY DATA:   I have reviewed the data as listed  . CBC Latest Ref Rng & Units 11/19/2016 08/03/2016 05/04/2016  WBC 4.0 - 10.3 10e3/uL  8.2 8.0 8.6  Hemoglobin 13.0 - 17.1 g/dL 16.5 15.9 16.1  Hematocrit 38.4 - 49.9 % 47.9 45.5 47.1  Platelets 140 - 400 10e3/uL 385 373 399    . CMP Latest Ref Rng & Units 11/19/2016 08/03/2016 05/04/2016  Glucose 70 - 140 mg/dl 114 106 92  BUN 7.0 - 26.0 mg/dL 22.6 24.9 23.1  Creatinine 0.7 - 1.3 mg/dL 1.1 1.0 1.2  Sodium 136 - 145 mEq/L 141 140 139  Potassium 3.5 - 5.1 mEq/L 4.1 4.3 4.3  Chloride 96 - 112 mEq/L - - -  CO2 22 - 29 mEq/L 28 27 28   Calcium 8.4 - 10.4 mg/dL 9.6 9.3 9.5  Total Protein 6.4 - 8.3 g/dL 7.9 7.7 7.7  Total Bilirubin 0.20 - 1.20 mg/dL 0.77 0.95 1.02  Alkaline Phos 40 - 150 U/L 97 83 68  AST 5 - 34 U/L 58(H) 38(H) 42(H)  ALT 0 - 55 U/L 79(H) 49 51    RADIOGRAPHIC STUDIES: I have personally reviewed the radiological images as listed and agreed with the findings in the report. No results found.  ASSESSMENT & PLAN: '  71 year old Caucasian male with   #1  Jak2 positive Essential Thrombocytosis.  No previous history of thrombosis. However his age about 55 and history of hypertension places him at high risk for thrombotic events.  Platelet counts remain WNL with the current dose of hydroxyurea. No evidence of thrombosis. #2 Abnormal LFTs Plan -Patient reports no toxicities from his hydroxyurea . -Borderline elevation of LFT's -- will rpt labs in 4 weeks to monitor . Is also on Lipitor (PCP messaged-) -will continue on hydroxyurea to 500 mg alternating with 1000mg  every other day. -continue daily aspirin --Continue follow-up with primary care physician for other medical issues.  -reasonable to continue Multivitamin. No strong indication for B complex replacement.  Return for labs in 4 weeks to monitor LFTs RTC with Dr Irene Limbo in 12 weeks with cbc, cmp. Earlier if any new concerns  I spent 15 minutes counseling the patient face to face. The total time spent in the appointment was 20 minutes and more than 50% was on counseling and direct patient cares.      Sullivan Lone MD [place] AAHIVMS [facility] Hematology/Oncology Physician [facility]  (Office):       [phone] (Work cell):  [phone] (Fax):           [phone]

## 2016-11-29 ENCOUNTER — Telehealth: Payer: Self-pay | Admitting: Hematology

## 2016-11-29 ENCOUNTER — Other Ambulatory Visit: Payer: Self-pay | Admitting: *Deleted

## 2016-11-29 DIAGNOSIS — G894 Chronic pain syndrome: Secondary | ICD-10-CM | POA: Diagnosis not present

## 2016-11-29 DIAGNOSIS — D473 Essential (hemorrhagic) thrombocythemia: Secondary | ICD-10-CM

## 2016-11-29 DIAGNOSIS — M62830 Muscle spasm of back: Secondary | ICD-10-CM | POA: Diagnosis not present

## 2016-11-29 DIAGNOSIS — M150 Primary generalized (osteo)arthritis: Secondary | ICD-10-CM | POA: Diagnosis not present

## 2016-11-29 DIAGNOSIS — M47817 Spondylosis without myelopathy or radiculopathy, lumbosacral region: Secondary | ICD-10-CM | POA: Diagnosis not present

## 2016-11-29 NOTE — Telephone Encounter (Signed)
lvm to inform pt of 12/27/16 appt at 8 am per LOS

## 2016-12-27 ENCOUNTER — Other Ambulatory Visit (HOSPITAL_BASED_OUTPATIENT_CLINIC_OR_DEPARTMENT_OTHER): Payer: Medicare Other

## 2016-12-27 DIAGNOSIS — D473 Essential (hemorrhagic) thrombocythemia: Secondary | ICD-10-CM | POA: Diagnosis not present

## 2016-12-27 LAB — CBC WITH DIFFERENTIAL/PLATELET
BASO%: 0.7 % (ref 0.0–2.0)
Basophils Absolute: 0.1 10*3/uL (ref 0.0–0.1)
EOS ABS: 0.3 10*3/uL (ref 0.0–0.5)
EOS%: 2.7 % (ref 0.0–7.0)
HCT: 48.1 % (ref 38.4–49.9)
HEMOGLOBIN: 16.8 g/dL (ref 13.0–17.1)
LYMPH%: 11.6 % — AB (ref 14.0–49.0)
MCH: 34.9 pg — ABNORMAL HIGH (ref 27.2–33.4)
MCHC: 34.9 g/dL (ref 32.0–36.0)
MCV: 100.1 fL — AB (ref 79.3–98.0)
MONO#: 0.8 10*3/uL (ref 0.1–0.9)
MONO%: 8.9 % (ref 0.0–14.0)
NEUT#: 7.1 10*3/uL — ABNORMAL HIGH (ref 1.5–6.5)
NEUT%: 76.1 % — ABNORMAL HIGH (ref 39.0–75.0)
Platelets: 418 10*3/uL — ABNORMAL HIGH (ref 140–400)
RBC: 4.8 10*6/uL (ref 4.20–5.82)
RDW: 13.7 % (ref 11.0–14.6)
WBC: 9.4 10*3/uL (ref 4.0–10.3)
lymph#: 1.1 10*3/uL (ref 0.9–3.3)

## 2016-12-27 LAB — COMPREHENSIVE METABOLIC PANEL
ALT: 71 U/L — ABNORMAL HIGH (ref 0–55)
ANION GAP: 10 meq/L (ref 3–11)
AST: 53 U/L — ABNORMAL HIGH (ref 5–34)
Albumin: 4.1 g/dL (ref 3.5–5.0)
Alkaline Phosphatase: 87 U/L (ref 40–150)
BUN: 22.6 mg/dL (ref 7.0–26.0)
CO2: 29 mEq/L (ref 22–29)
Calcium: 9.6 mg/dL (ref 8.4–10.4)
Chloride: 102 mEq/L (ref 98–109)
Creatinine: 1.3 mg/dL (ref 0.7–1.3)
EGFR: 54 mL/min/{1.73_m2} — ABNORMAL LOW (ref >90)
Glucose: 124 mg/dl (ref 70–140)
POTASSIUM: 4.6 meq/L (ref 3.5–5.1)
Sodium: 141 mEq/L (ref 136–145)
Total Bilirubin: 0.75 mg/dL (ref 0.20–1.20)
Total Protein: 7.6 g/dL (ref 6.4–8.3)

## 2017-01-11 DIAGNOSIS — I10 Essential (primary) hypertension: Secondary | ICD-10-CM | POA: Diagnosis not present

## 2017-01-24 DIAGNOSIS — M47817 Spondylosis without myelopathy or radiculopathy, lumbosacral region: Secondary | ICD-10-CM | POA: Diagnosis not present

## 2017-01-24 DIAGNOSIS — G894 Chronic pain syndrome: Secondary | ICD-10-CM | POA: Diagnosis not present

## 2017-01-24 DIAGNOSIS — M150 Primary generalized (osteo)arthritis: Secondary | ICD-10-CM | POA: Diagnosis not present

## 2017-01-24 DIAGNOSIS — Z79891 Long term (current) use of opiate analgesic: Secondary | ICD-10-CM | POA: Diagnosis not present

## 2017-01-24 DIAGNOSIS — M62830 Muscle spasm of back: Secondary | ICD-10-CM | POA: Diagnosis not present

## 2017-02-04 ENCOUNTER — Telehealth: Payer: Self-pay | Admitting: Hematology

## 2017-02-04 NOTE — Telephone Encounter (Signed)
lvm to inform pt of r/s 4/3 apptto 4/24 at 0845 am per MD on PAL

## 2017-02-15 ENCOUNTER — Ambulatory Visit: Payer: Medicare Other | Admitting: Hematology

## 2017-02-15 ENCOUNTER — Other Ambulatory Visit: Payer: Medicare Other

## 2017-03-08 ENCOUNTER — Other Ambulatory Visit (HOSPITAL_BASED_OUTPATIENT_CLINIC_OR_DEPARTMENT_OTHER): Payer: Medicare Other

## 2017-03-08 ENCOUNTER — Telehealth: Payer: Self-pay | Admitting: Hematology

## 2017-03-08 ENCOUNTER — Encounter: Payer: Self-pay | Admitting: Hematology

## 2017-03-08 ENCOUNTER — Ambulatory Visit (HOSPITAL_BASED_OUTPATIENT_CLINIC_OR_DEPARTMENT_OTHER): Payer: Medicare Other | Admitting: Hematology

## 2017-03-08 VITALS — BP 163/77 | HR 75 | Temp 97.7°F | Resp 16 | Wt 229.9 lb

## 2017-03-08 DIAGNOSIS — R7989 Other specified abnormal findings of blood chemistry: Secondary | ICD-10-CM | POA: Diagnosis not present

## 2017-03-08 DIAGNOSIS — D473 Essential (hemorrhagic) thrombocythemia: Secondary | ICD-10-CM | POA: Diagnosis not present

## 2017-03-08 DIAGNOSIS — R945 Abnormal results of liver function studies: Secondary | ICD-10-CM

## 2017-03-08 LAB — CBC & DIFF AND RETIC
BASO%: 0.6 % (ref 0.0–2.0)
Basophils Absolute: 0.1 10*3/uL (ref 0.0–0.1)
EOS ABS: 0.3 10*3/uL (ref 0.0–0.5)
EOS%: 3.4 % (ref 0.0–7.0)
HCT: 49 % (ref 38.4–49.9)
HGB: 16.9 g/dL (ref 13.0–17.1)
IMMATURE RETIC FRACT: 10 % (ref 3.00–10.60)
LYMPH%: 15.4 % (ref 14.0–49.0)
MCH: 34.1 pg — ABNORMAL HIGH (ref 27.2–33.4)
MCHC: 34.5 g/dL (ref 32.0–36.0)
MCV: 99 fL — ABNORMAL HIGH (ref 79.3–98.0)
MONO#: 0.8 10*3/uL (ref 0.1–0.9)
MONO%: 9 % (ref 0.0–14.0)
NEUT%: 71.6 % (ref 39.0–75.0)
NEUTROS ABS: 6 10*3/uL (ref 1.5–6.5)
NRBC: 0 % (ref 0–0)
PLATELETS: 396 10*3/uL (ref 140–400)
RBC: 4.95 10*6/uL (ref 4.20–5.82)
RDW: 14.3 % (ref 11.0–14.6)
RETIC %: 1.59 % (ref 0.80–1.80)
RETIC CT ABS: 78.71 10*3/uL (ref 34.80–93.90)
WBC: 8.4 10*3/uL (ref 4.0–10.3)
lymph#: 1.3 10*3/uL (ref 0.9–3.3)

## 2017-03-08 LAB — COMPREHENSIVE METABOLIC PANEL
ALT: 63 U/L — AB (ref 0–55)
AST: 46 U/L — AB (ref 5–34)
Albumin: 4.1 g/dL (ref 3.5–5.0)
Alkaline Phosphatase: 92 U/L (ref 40–150)
Anion Gap: 12 mEq/L — ABNORMAL HIGH (ref 3–11)
BILIRUBIN TOTAL: 0.71 mg/dL (ref 0.20–1.20)
BUN: 23.7 mg/dL (ref 7.0–26.0)
CO2: 28 mEq/L (ref 22–29)
CREATININE: 1.1 mg/dL (ref 0.7–1.3)
Calcium: 9.4 mg/dL (ref 8.4–10.4)
Chloride: 104 mEq/L (ref 98–109)
EGFR: 69 mL/min/{1.73_m2} — ABNORMAL LOW (ref >90)
GLUCOSE: 115 mg/dL (ref 70–140)
Potassium: 4.5 mEq/L (ref 3.5–5.1)
SODIUM: 143 meq/L (ref 136–145)
TOTAL PROTEIN: 7.6 g/dL (ref 6.4–8.3)

## 2017-03-08 LAB — LACTATE DEHYDROGENASE: LDH: 243 U/L (ref 125–245)

## 2017-03-08 NOTE — Patient Instructions (Signed)
Please talk to PCP about decreasing dose of Lipitor due to liver function.    Thank you for choosing [facility] to provide your oncology and hematology care.  To afford each patient quality time with our providers, please arrive 30 minutes before your scheduled appointment time.  If you arrive late for your appointment, you may be asked to reschedule.  We strive to give you quality time with our providers, and arriving late affects you and other patients whose appointments are after yours.   If you are a no show for multiple scheduled visits, you may be dismissed from the clinic at the providers discretion.    Again, thank you for choosing [facility], our hope is that these requests will decrease the amount of time that you wait before being seen by our physicians.  ______________________________________________________________________  Should you have questions after your visit to the [facility], please contact our office at [phone] between the hours of 8:30 and 4:30 p.m.    Voicemails left after 4:30p.m will not be returned until the following business day.    For prescription refill requests, please have your pharmacy contact us directly.  Please also try to allow 48 hours for prescription requests.    Please contact the scheduling department for questions regarding scheduling.  For scheduling of procedures such as PET scans, CT scans, MRI, Ultrasound, etc please contact central scheduling at [phone].    Resources For Cancer Patients and Caregivers:   American Cancer Society:  [phone]  Can help patients locate various types of support and financial assistance  Cancer Care: 1-800-813-HOPE 971-649-9746) Provides financial assistance, online support groups, medication/co-pay assistance.    [place]:  620-766-3231 Where to apply for food stamps, Medicaid, and utility assistance  Medicare Rights Center: 7811783914 Helps  people with Medicare understand their rights and benefits, navigate the Medicare system, and secure the quality healthcare they deserve  SCAT: [place] [employer]'s shared-ride transportation service for eligible riders who have a disability that prevents them from riding the fixed route bus.    For additional information on assistance programs please contact our social worker:   [place]:  [phone]

## 2017-03-08 NOTE — Progress Notes (Signed)
[name]  HEMATOLOGY ONCOLOGY PROGRESS NOTE  Date of service:  03/08/2017  Jordan Vazquez Care Team: [name], MD as PCP - General (Internal Medicine)  CC: f/u for ET  Diagnosis:  JAK2 Essential thrombocytosis  Current Treatment: Hydroxyurea 500 mg alternating with 1000mg  daily.  INTERVAL HISTORY:  Jordan Vazquez is here to follow up on Jordan Vazquez Jak2 positive essential thrombocytosis.  Jordan Vazquez has no acute new concerns. No symptoms suggestive of venous/arterial thrombosis. No issues with infections. No problems tolerating the hydroxyurea.No new headaches/leg ulcers/ skin rashes or any other new concerns. Jordan Vazquez has been noted to have some borderline elevation of Jordan Vazquez AST and ALT with normal bilirubin levels on the last few blood tests. No abdominal pain fevers or chills. We discussed that this could be from Jordan Vazquez hydroxyurea or from Jordan Vazquez Lipitor. We discussed getting her ultrasound of the abdomen to rule out other etiologies such as fatty liver. We discussed that Jordan Vazquez should talk to Jordan Vazquez primary care physician about trying to back off on Jordan Vazquez statins for a while to see if it improves Jordan Vazquez liver function tests. LFTs are not worsening over the last few checks. Denies using any over-the-counter supplements. Denies using alcohol. No recent viral infections.     REVIEW OF SYSTEMS:    10 Point review of systems of done and is negative except as noted above.  Past Medical History:  Diagnosis Date  . 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   . Hypertension     Past Surgical History:  Procedure Laterality Date  . CARDIAC CATHETERIZATION     16 years ago negative   . HERNIA REPAIR     right inguinal hernia surgery   . I&D KNEE WITH POLY EXCHANGE  12/06/2011   Procedure: IRRIGATION AND DEBRIDEMENT KNEE WITH POLY EXCHANGE;  Surgeon: Mauri Pole, MD;  Location: [facility];  Service: Orthopedics;   Laterality: Right;  Right Total Knee Irrigation and Debridement with Poly Exchange  . JOINT REPLACEMENT     left knee 5/12, right TKA 10/12     Social History  Substance Use Topics  . Smoking status: Former Smoker    Packs/day: 0.25    Types: Cigarettes    Quit date: 11/16/1963  . Smokeless tobacco: Never Used  . Alcohol use 1.2 oz/week    2 Cans of beer per week    ALLERGIES:  is allergic to niacin and related.  MEDICATIONS:  Current Outpatient Prescriptions  Medication Sig Dispense Refill  . aspirin 325 MG tablet Take 325 mg by mouth daily.     [name] atorvastatin (LIPITOR) 40 MG tablet Take 40 mg by mouth every morning.    . chlorthalidone (HYGROTON) 25 MG tablet TK 1 T PO EVERY MORNING  3  . Cholecalciferol (VITAMIN D-3) 1000 UNITS CAPS Take 1,000 Units by mouth daily.    . hydroxyurea (HYDREA) 500 MG capsule TAKE 2 CAPSULES BY MOUTH ON ONE DAY THEN TAKE 1 CAPSULE BY MOUTH ON OTHER DAY AS DIRECTED. TAKE WITH FOOD 90 capsule 3  . Multiple Vitamin (MULITIVITAMIN WITH MINERALS) TABS Take 2 tablets by mouth daily.     . Omega-3 Fatty Acids (SEA-OMEGA 30 PO) Take 1 capsule by mouth daily.    [name] oxyCODONE (OXY IR/ROXICODONE) 5 MG immediate release tablet TK 1.5 TS PO Q 6 H PRN  0  . tiZANidine (ZANAFLEX) 2 MG tablet TK 1 T  PO QID PRN  0   No current facility-administered medications for this visit.     PHYSICAL EXAMINATION: ECOG PERFORMANCE STATUS: 1 - Symptomatic but completely ambulatory  . Vitals:   03/08/17 0903  BP: (!) 163/77  Pulse: 75  Resp: 16  Temp: 97.7 F (36.5 C)    Filed Weights   03/08/17 0903  Weight: 229 lb 14.4 oz (104.3 kg)   .Body mass index is 32.52 kg/m.  GENERAL:alert, in no acute distress and comfortable SKIN: skin color, texture, turgor are normal, no rashes or significant lesions EYES: normal, conjunctiva are pink and non-injected, sclera clear OROPHARYNX:no exudate, no erythema and lips, buccal mucosa, and tongue normal  NECK: supple, no JVD,  thyroid normal size, non-tender, without nodularity LYMPH:  no palpable lymphadenopathy in the cervical, axillary or inguinal LUNGS: clear to auscultation with normal respiratory effort HEART: regular rate & rhythm,  no murmurs and no lower extremity edema ABDOMEN: abdomen soft, non-tender, normoactive bowel sounds  Musculoskeletal: no cyanosis of digits and no clubbing  PSYCH: alert & oriented x 3 with fluent speech NEURO: no focal motor/sensory deficits  LABORATORY DATA:   I have reviewed the data as listed  . CBC Latest Ref Rng & Units 03/08/2017 12/27/2016 11/19/2016  WBC 4.0 - 10.3 10e3/uL 8.4 9.4 8.2  Hemoglobin 13.0 - 17.1 g/dL 16.9 16.8 16.5  Hematocrit 38.4 - 49.9 % 49.0 48.1 47.9  Platelets 140 - 400 10e3/uL 396 418(H) 385    . CMP Latest Ref Rng & Units 03/08/2017 12/27/2016 11/19/2016  Glucose 70 - 140 mg/dl 115 124 114  BUN 7.0 - 26.0 mg/dL 23.7 22.6 22.6  Creatinine 0.7 - 1.3 mg/dL 1.1 1.3 1.1  Sodium 136 - 145 mEq/L 143 141 141  Potassium 3.5 - 5.1 mEq/L 4.5 4.6 4.1  Chloride 96 - 112 mEq/L - - -  CO2 22 - 29 mEq/L 28 29 28   Calcium 8.4 - 10.4 mg/dL 9.4 9.6 9.6  Total Protein 6.4 - 8.3 g/dL 7.6 7.6 7.9  Total Bilirubin 0.20 - 1.20 mg/dL 0.71 0.75 0.77  Alkaline Phos 40 - 150 U/L 92 87 97  AST 5 - 34 U/L 46(H) 53(H) 58(H)  ALT 0 - 55 U/L 63(H) 71(H) 79(H)    RADIOGRAPHIC STUDIES: I have personally reviewed the radiological images as listed and agreed with the findings in the report. No results found.  ASSESSMENT & PLAN: '  71 year old Caucasian male with   #1  Jak2 positive Essential Thrombocytosis.  No previous history of thrombosis. However Jordan Vazquez age above 3 and history of hypertension places him at high risk for thrombotic events.  Platelet counts remain WNL with the current dose of hydroxyurea. No evidence of thrombosis. #2 Abnormal LFTs - transaminases still slightly elevated with no significant increase. Bilirubin and alkaline phosphatase within normal  limits. Could be from medications including Jordan Vazquez hydroxyurea or statins. Possibly from fatty liver. Jordan Vazquez denies use of over-the-counter supplements. Denies use of alcohol. Denies using significant Tylenol. Plan -Jordan Vazquez reports no  overt toxicities from Jordan Vazquez hydroxyurea . -will continue on hydroxyurea to 500 mg alternating with 1000mg  every other day. -continue daily aspirin -Would recommend primary care physician consider backing off on the statin to see that helps improve liver function tests . Jordan Vazquez was counseled to call Jordan Vazquez PCP and discuss this. -Ultrasound abdomen ordered to rule out other etiologies .to be done in the next week or so . --Continue follow-up with primary care physician for other medical issues.  -reasonable to  continue Multivitamin. No strong indication for B complex replacement.  [place] abd in 1-2 weeks Labs in 4 weeks RTC with Dr Irene Limbo in 8 weeks with labs  I spent 20 minutes counseling the Jordan Vazquez face to face. The total time spent in the appointment was 25 minutes and more than 50% was on counseling and direct Jordan Vazquez cares.    Sullivan Lone MD [place] AAHIVMS [facility] Hematology/Oncology Physician [facility]  (Office):       [phone] (Work cell):  [phone] (Fax):           [phone]

## 2017-03-08 NOTE — Telephone Encounter (Signed)
Gave patient AVS and calender per 4/24 los. [facility] to contact patient with [place] appts.

## 2017-03-15 ENCOUNTER — Ambulatory Visit (HOSPITAL_COMMUNITY)
Admission: RE | Admit: 2017-03-15 | Discharge: 2017-03-15 | Disposition: A | Discharge status: Home / Self Care | Payer: Medicare Other | Source: Ambulatory Visit | Attending: Hematology | Admitting: Hematology

## 2017-03-15 DIAGNOSIS — K7689 Other specified diseases of liver: Secondary | ICD-10-CM | POA: Diagnosis not present

## 2017-03-15 DIAGNOSIS — D696 Thrombocytopenia, unspecified: Secondary | ICD-10-CM | POA: Diagnosis not present

## 2017-03-15 DIAGNOSIS — N281 Cyst of kidney, acquired: Secondary | ICD-10-CM | POA: Diagnosis not present

## 2017-03-15 DIAGNOSIS — R945 Abnormal results of liver function studies: Secondary | ICD-10-CM | POA: Diagnosis not present

## 2017-03-15 DIAGNOSIS — D473 Essential (hemorrhagic) thrombocythemia: Secondary | ICD-10-CM | POA: Diagnosis not present

## 2017-03-15 DIAGNOSIS — R161 Splenomegaly, not elsewhere classified: Secondary | ICD-10-CM | POA: Insufficient documentation

## 2017-03-21 DIAGNOSIS — M150 Primary generalized (osteo)arthritis: Secondary | ICD-10-CM | POA: Diagnosis not present

## 2017-03-21 DIAGNOSIS — G894 Chronic pain syndrome: Secondary | ICD-10-CM | POA: Diagnosis not present

## 2017-03-21 DIAGNOSIS — M62830 Muscle spasm of back: Secondary | ICD-10-CM | POA: Diagnosis not present

## 2017-03-21 DIAGNOSIS — M47817 Spondylosis without myelopathy or radiculopathy, lumbosacral region: Secondary | ICD-10-CM | POA: Diagnosis not present

## 2017-04-05 ENCOUNTER — Other Ambulatory Visit (HOSPITAL_BASED_OUTPATIENT_CLINIC_OR_DEPARTMENT_OTHER): Payer: Medicare Other

## 2017-04-05 DIAGNOSIS — R945 Abnormal results of liver function studies: Secondary | ICD-10-CM

## 2017-04-05 DIAGNOSIS — K7689 Other specified diseases of liver: Secondary | ICD-10-CM

## 2017-04-05 DIAGNOSIS — D473 Essential (hemorrhagic) thrombocythemia: Secondary | ICD-10-CM | POA: Diagnosis not present

## 2017-04-05 LAB — COMPREHENSIVE METABOLIC PANEL
ALBUMIN: 4.2 g/dL (ref 3.5–5.0)
ALK PHOS: 104 U/L (ref 40–150)
ALT: 73 U/L — ABNORMAL HIGH (ref 0–55)
AST: 54 U/L — AB (ref 5–34)
Anion Gap: 12 mEq/L — ABNORMAL HIGH (ref 3–11)
BILIRUBIN TOTAL: 0.69 mg/dL (ref 0.20–1.20)
BUN: 21.2 mg/dL (ref 7.0–26.0)
CO2: 28 mEq/L (ref 22–29)
Calcium: 9.7 mg/dL (ref 8.4–10.4)
Chloride: 102 mEq/L (ref 98–109)
Creatinine: 1.3 mg/dL (ref 0.7–1.3)
EGFR: 55 mL/min/{1.73_m2} — AB (ref >90)
GLUCOSE: 98 mg/dL (ref 70–140)
POTASSIUM: 4.5 meq/L (ref 3.5–5.1)
Sodium: 142 mEq/L (ref 136–145)
TOTAL PROTEIN: 8 g/dL (ref 6.4–8.3)

## 2017-04-05 LAB — CBC & DIFF AND RETIC
BASO%: 0.5 % (ref 0.0–2.0)
Basophils Absolute: 0 10*3/uL (ref 0.0–0.1)
EOS ABS: 0.3 10*3/uL (ref 0.0–0.5)
EOS%: 3 % (ref 0.0–7.0)
HCT: 49 % (ref 38.4–49.9)
HEMOGLOBIN: 16.9 g/dL (ref 13.0–17.1)
IMMATURE RETIC FRACT: 9.5 % (ref 3.00–10.60)
LYMPH%: 14 % (ref 14.0–49.0)
MCH: 34.6 pg — AB (ref 27.2–33.4)
MCHC: 34.5 g/dL (ref 32.0–36.0)
MCV: 100.4 fL — AB (ref 79.3–98.0)
MONO#: 1 10*3/uL — AB (ref 0.1–0.9)
MONO%: 12 % (ref 0.0–14.0)
NEUT#: 6 10*3/uL (ref 1.5–6.5)
NEUT%: 70.5 % (ref 39.0–75.0)
Platelets: 451 10*3/uL — ABNORMAL HIGH (ref 140–400)
RBC: 4.88 10*6/uL (ref 4.20–5.82)
RDW: 14.4 % (ref 11.0–14.6)
Retic %: 1.55 % (ref 0.80–1.80)
Retic Ct Abs: 75.64 10*3/uL (ref 34.80–93.90)
WBC: 8.5 10*3/uL (ref 4.0–10.3)
lymph#: 1.2 10*3/uL (ref 0.9–3.3)

## 2017-05-03 ENCOUNTER — Ambulatory Visit (HOSPITAL_BASED_OUTPATIENT_CLINIC_OR_DEPARTMENT_OTHER): Payer: Medicare Other | Admitting: Hematology

## 2017-05-03 ENCOUNTER — Telehealth: Payer: Self-pay | Admitting: Hematology

## 2017-05-03 ENCOUNTER — Other Ambulatory Visit (HOSPITAL_BASED_OUTPATIENT_CLINIC_OR_DEPARTMENT_OTHER): Payer: Medicare Other

## 2017-05-03 ENCOUNTER — Encounter: Payer: Self-pay | Admitting: Hematology

## 2017-05-03 VITALS — BP 134/70 | HR 70 | Temp 98.1°F | Resp 18 | Ht 70.0 in | Wt 229.9 lb

## 2017-05-03 DIAGNOSIS — R945 Abnormal results of liver function studies: Secondary | ICD-10-CM

## 2017-05-03 DIAGNOSIS — D473 Essential (hemorrhagic) thrombocythemia: Secondary | ICD-10-CM

## 2017-05-03 DIAGNOSIS — K7689 Other specified diseases of liver: Secondary | ICD-10-CM

## 2017-05-03 LAB — CBC & DIFF AND RETIC
BASO%: 0.5 % (ref 0.0–2.0)
BASOS ABS: 0 10*3/uL (ref 0.0–0.1)
EOS%: 2.9 % (ref 0.0–7.0)
Eosinophils Absolute: 0.2 10*3/uL (ref 0.0–0.5)
HEMATOCRIT: 49.2 % (ref 38.4–49.9)
HEMOGLOBIN: 16.9 g/dL (ref 13.0–17.1)
IMMATURE RETIC FRACT: 6.9 % (ref 3.00–10.60)
LYMPH#: 1 10*3/uL (ref 0.9–3.3)
LYMPH%: 12.3 % — AB (ref 14.0–49.0)
MCH: 34.6 pg — ABNORMAL HIGH (ref 27.2–33.4)
MCHC: 34.3 g/dL (ref 32.0–36.0)
MCV: 100.8 fL — ABNORMAL HIGH (ref 79.3–98.0)
MONO#: 0.7 10*3/uL (ref 0.1–0.9)
MONO%: 9.2 % (ref 0.0–14.0)
NEUT#: 6 10*3/uL (ref 1.5–6.5)
NEUT%: 75.1 % — AB (ref 39.0–75.0)
Platelets: 426 10*3/uL — ABNORMAL HIGH (ref 140–400)
RBC: 4.88 10*6/uL (ref 4.20–5.82)
RDW: 14.4 % (ref 11.0–14.6)
RETIC %: 1.88 % — AB (ref 0.80–1.80)
RETIC CT ABS: 91.74 10*3/uL (ref 34.80–93.90)
WBC: 8 10*3/uL (ref 4.0–10.3)
nRBC: 0 % (ref 0–0)

## 2017-05-03 LAB — COMPREHENSIVE METABOLIC PANEL
ALBUMIN: 4 g/dL (ref 3.5–5.0)
ALK PHOS: 93 U/L (ref 40–150)
ALT: 69 U/L — AB (ref 0–55)
AST: 48 U/L — AB (ref 5–34)
Anion Gap: 12 mEq/L — ABNORMAL HIGH (ref 3–11)
BILIRUBIN TOTAL: 0.7 mg/dL (ref 0.20–1.20)
BUN: 20.3 mg/dL (ref 7.0–26.0)
CALCIUM: 9.4 mg/dL (ref 8.4–10.4)
CHLORIDE: 104 meq/L (ref 98–109)
CO2: 25 mEq/L (ref 22–29)
CREATININE: 1.2 mg/dL (ref 0.7–1.3)
EGFR: 62 mL/min/{1.73_m2} — ABNORMAL LOW (ref >90)
Glucose: 105 mg/dl (ref 70–140)
Potassium: 4.3 mEq/L (ref 3.5–5.1)
Sodium: 142 mEq/L (ref 136–145)
TOTAL PROTEIN: 7.7 g/dL (ref 6.4–8.3)

## 2017-05-03 MED ORDER — HYDROXYUREA 500 MG PO CAPS: ORAL_CAPSULE | ORAL | 3 refills | Status: DC

## 2017-05-03 NOTE — Patient Instructions (Signed)
Thank you for choosing [facility] to provide your oncology and hematology care.  To afford each patient quality time with our providers, please arrive 30 minutes before your scheduled appointment time.  If you arrive late for your appointment, you may be asked to reschedule.  We strive to give you quality time with our providers, and arriving late affects you and other patients whose appointments are after yours.  If you are a no show for multiple scheduled visits, you may be dismissed from the clinic at the providers discretion.   Again, thank you for choosing [facility], our hope is that these requests will decrease the amount of time that you wait before being seen by our physicians.  ______________________________________________________________________ Should you have questions after your visit to the [facility], please contact our office at [phone] between the hours of 8:30 and 4:30 p.m.    Voicemails left after 4:30p.m will not be returned until the following business day.   For prescription refill requests, please have your pharmacy contact us directly.  Please also try to allow 48 hours for prescription requests.   Please contact the scheduling department for questions regarding scheduling.  For scheduling of procedures such as PET scans, CT scans, MRI, Ultrasound, etc please contact central scheduling at [phone].   Resources For Cancer Patients and Caregivers:  American Cancer Society:  800-227-2345  Can help patients locate various types of support and financial assistance Cancer Care: 1-800-813-HOPE (4673) Provides financial assistance, online support groups, medication/co-pay assistance.   [place] DSS:  336-641-3447 Where to apply for food stamps, Medicaid, and utility assistance Medicare Rights Center: 800-333-4114 Helps people with Medicare understand their rights and benefits, navigate the Medicare system, and secure the  quality healthcare they deserve SCAT: 336-333-6589 [place] [employer]'s shared-ride transportation service for eligible riders who have a disability that prevents them from riding the fixed route bus.   For additional information on assistance programs please contact our social worker:   [place]rier Hock/Abigail Elmore:  [phone] 

## 2017-05-03 NOTE — Telephone Encounter (Signed)
Scheduled appt per 6/19 los - Gave patient AVS and calender per LOS .  

## 2017-05-03 NOTE — Progress Notes (Signed)
[name]  HEMATOLOGY ONCOLOGY PROGRESS NOTE  Date of service:  05/03/2017  Patient Care Team: [name], MD as PCP - General (Internal Medicine)  CC: f/u for ET  Diagnosis:  JAK2 Essential thrombocytosis  Current Treatment: Hydroxyurea 500 mg alternating with 1000mg  daily.  INTERVAL HISTORY:  [name] is here to follow up on his Jak2 positive essential thrombocytosis.  He has no acute new concerns. No symptoms suggestive of venous/arterial thrombosis. No issues with infections. No problems tolerating the hydroxyurea.No new headaches/leg ulcers/ skin rashes or any other new concerns. He had an ultrasound that showed changes of fatty liver. Borderline splenomegaly likely related to his MPN. History tizanidine was changed to baclofen. Notes minimal alcohol use. Continues to be on statin.    REVIEW OF SYSTEMS:    10 Point review of systems of done and is negative except as noted above.  Past Medical History:  Diagnosis Date  . 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   . Hypertension     Past Surgical History:  Procedure Laterality Date  . CARDIAC CATHETERIZATION     16 years ago negative   . HERNIA REPAIR     right inguinal hernia surgery   . I&D KNEE WITH POLY EXCHANGE  12/06/2011   Procedure: IRRIGATION AND DEBRIDEMENT KNEE WITH POLY EXCHANGE;  Surgeon: Mauri Pole, MD;  Location: [facility];  Service: Orthopedics;  Laterality: Right;  Right Total Knee Irrigation and Debridement with Poly Exchange  . JOINT REPLACEMENT     left knee 5/12, right TKA 10/12     Social History  Substance Use Topics  . Smoking status: Former Smoker    Packs/day: 0.25    Types: Cigarettes    Quit date: 11/16/1963  . Smokeless tobacco: Never Used  . Alcohol use 1.2 oz/week    2 Cans of beer per week    ALLERGIES:  is allergic to niacin and  related.  MEDICATIONS:  Current Outpatient Prescriptions  Medication Sig Dispense Refill  . aspirin 325 MG tablet Take 325 mg by mouth daily.     [name] atorvastatin (LIPITOR) 40 MG tablet Take 40 mg by mouth every morning.    . baclofen (LIORESAL) 10 MG tablet TAKE 1/2 TABLET BY MOUTH QID AS NEEDED  0  . chlorthalidone (HYGROTON) 25 MG tablet TK 1 T PO EVERY MORNING  3  . Cholecalciferol (VITAMIN D-3) 1000 UNITS CAPS Take 1,000 Units by mouth daily.    . hydroxyurea (HYDREA) 500 MG capsule TAKE 2 CAPSULES BY MOUTH ON ONE DAY THEN TAKE 1 CAPSULE BY MOUTH ON OTHER DAY AS DIRECTED. TAKE WITH FOOD 90 capsule 3  . Multiple Vitamin (MULITIVITAMIN WITH MINERALS) TABS Take 2 tablets by mouth daily.     . Omega-3 Fatty Acids (SEA-OMEGA 30 PO) Take 1 capsule by mouth daily.    [name] oxyCODONE (OXY IR/ROXICODONE) 5 MG immediate release tablet TK 1.5 TS PO Q 6 H PRN  0   No current facility-administered medications for this visit.     PHYSICAL EXAMINATION: ECOG PERFORMANCE STATUS: 1 - Symptomatic but completely ambulatory  . Vitals:   05/03/17 0828  BP: 134/70  Pulse: 70  Resp: 18  Temp: 98.1 F (36.7 C)    Filed Weights   05/03/17 0828  Weight: 229 lb 14.4 oz (104.3 kg)   .Body mass index is 32.99 kg/m.  GENERAL:alert, in  no acute distress and comfortable SKIN: skin color, texture, turgor are normal, no rashes or significant lesions EYES: normal, conjunctiva are pink and non-injected, sclera clear OROPHARYNX:no exudate, no erythema and lips, buccal mucosa, and tongue normal  NECK: supple, no JVD, thyroid normal size, non-tender, without nodularity LYMPH:  no palpable lymphadenopathy in the cervical, axillary or inguinal LUNGS: clear to auscultation with normal respiratory effort HEART: regular rate & rhythm,  no murmurs and no lower extremity edema ABDOMEN: abdomen soft, non-tender, normoactive bowel sounds  Musculoskeletal: no cyanosis of digits and no clubbing  PSYCH: alert & oriented  x 3 with fluent speech NEURO: no focal motor/sensory deficits  LABORATORY DATA:   I have reviewed the data as listed  . CBC Latest Ref Rng & Units 05/03/2017 04/05/2017 03/08/2017  WBC 4.0 - 10.3 10e3/uL 8.0 8.5 8.4  Hemoglobin 13.0 - 17.1 g/dL 16.9 16.9 16.9  Hematocrit 38.4 - 49.9 % 49.2 49.0 49.0  Platelets 140 - 400 10e3/uL 426(H) 451(H) 396    . CMP Latest Ref Rng & Units 05/03/2017 04/05/2017 03/08/2017  Glucose 70 - 140 mg/dl 105 98 115  BUN 7.0 - 26.0 mg/dL 20.3 21.2 23.7  Creatinine 0.7 - 1.3 mg/dL 1.2 1.3 1.1  Sodium 136 - 145 mEq/L 142 142 143  Potassium 3.5 - 5.1 mEq/L 4.3 4.5 4.5  Chloride 96 - 112 mEq/L - - -  CO2 22 - 29 mEq/L 25 28 28   Calcium 8.4 - 10.4 mg/dL 9.4 9.7 9.4  Total Protein 6.4 - 8.3 g/dL 7.7 8.0 7.6  Total Bilirubin 0.20 - 1.20 mg/dL 0.70 0.69 0.71  Alkaline Phos 40 - 150 U/L 93 104 92  AST 5 - 34 U/L 48(H) 54(H) 46(H)  ALT 0 - 55 U/L 69(H) 73(H) 63(H)    RADIOGRAPHIC STUDIES: I have personally reviewed the radiological images as listed and agreed with the findings in the report.  [place] abd 03/15/2017: IMPRESSION: 1. Liver is echogenic consistent with fatty infiltration and/or hepatocellular disease. No focal hepatic abnormality. No gallstones or biliary distention.  2. Spleen is enlarged at 14.4 cm with a volume 0 620 cc.  Electronically Signed   By: Marcello Moores  Register   On: 03/15/2017 09:15   ASSESSMENT & PLAN: '  71 year old Caucasian male with   #1  Jak2 positive Essential Thrombocytosis.  No previous history of thrombosis. However his age above 55 and history of hypertension places him at high risk for thrombotic events.  Platelet counts remain stable at approx 400k with the current dose of hydroxyurea. No evidence of thrombosis. #2 Abnormal LFTs - transaminases still slightly elevated with no significant increase. Bilirubin and alkaline phosphatase within normal limits. Ultrasound abdomen consistent with fatty liver. Less likely to  be medication related but cannot rule out some element from medications including his hydroxyurea or statins. Patient denies use of over-the-counter supplements. Denies use of alcohol. Denies using significant Tylenol. Plan -Patient reports no  overt toxicities from his hydroxyurea . -will continue on hydroxyurea to 500 mg alternating with 1000mg  every other day. -continue daily aspirin -Ultrasound abdomen results were discussed with the patient in details. -He was recommended lifestyle modifications including appropriate diet and exercise to help address his fatty liver. He will need to continue follow-up with his primary doctor to continue focusing on this. -Would try to minimize hepatotoxic medications. -If the patient's liver functions are stable and his platelets still remain more than 400k might need to consider increasing his hydroxyurea dose on his next visit.  RTC with Dr Irene Limbo in 8 weeks with labs  I spent 20 minutes counseling the patient face to face. The total time spent in the appointment was 25 minutes and more than 50% was on counseling and direct patient cares.    Sullivan Lone MD [place] AAHIVMS [facility] Hematology/Oncology Physician [facility]  (Office):       [phone] (Work cell):  [phone] (Fax):           [phone]

## 2017-05-16 DIAGNOSIS — M150 Primary generalized (osteo)arthritis: Secondary | ICD-10-CM | POA: Diagnosis not present

## 2017-05-16 DIAGNOSIS — M47817 Spondylosis without myelopathy or radiculopathy, lumbosacral region: Secondary | ICD-10-CM | POA: Diagnosis not present

## 2017-05-16 DIAGNOSIS — G894 Chronic pain syndrome: Secondary | ICD-10-CM | POA: Diagnosis not present

## 2017-05-16 DIAGNOSIS — M62830 Muscle spasm of back: Secondary | ICD-10-CM | POA: Diagnosis not present

## 2017-05-29 ENCOUNTER — Other Ambulatory Visit: Payer: Self-pay | Admitting: Hematology

## 2017-07-05 ENCOUNTER — Other Ambulatory Visit (HOSPITAL_BASED_OUTPATIENT_CLINIC_OR_DEPARTMENT_OTHER): Payer: Medicare Other

## 2017-07-05 ENCOUNTER — Encounter: Payer: Self-pay | Admitting: Hematology

## 2017-07-05 ENCOUNTER — Telehealth: Payer: Self-pay | Admitting: Hematology

## 2017-07-05 ENCOUNTER — Ambulatory Visit (HOSPITAL_BASED_OUTPATIENT_CLINIC_OR_DEPARTMENT_OTHER): Payer: Medicare Other | Admitting: Hematology

## 2017-07-05 VITALS — BP 126/63 | HR 68 | Temp 98.0°F | Resp 19 | Ht 70.0 in | Wt 230.1 lb

## 2017-07-05 DIAGNOSIS — K7689 Other specified diseases of liver: Secondary | ICD-10-CM | POA: Diagnosis not present

## 2017-07-05 DIAGNOSIS — D473 Essential (hemorrhagic) thrombocythemia: Secondary | ICD-10-CM

## 2017-07-05 DIAGNOSIS — R945 Abnormal results of liver function studies: Secondary | ICD-10-CM

## 2017-07-05 LAB — COMPREHENSIVE METABOLIC PANEL
ALBUMIN: 3.9 g/dL (ref 3.5–5.0)
ALK PHOS: 84 U/L (ref 40–150)
ALT: 75 U/L — ABNORMAL HIGH (ref 0–55)
AST: 51 U/L — ABNORMAL HIGH (ref 5–34)
Anion Gap: 9 mEq/L (ref 3–11)
BUN: 24.4 mg/dL (ref 7.0–26.0)
CHLORIDE: 104 meq/L (ref 98–109)
CO2: 28 meq/L (ref 22–29)
Calcium: 9.3 mg/dL (ref 8.4–10.4)
Creatinine: 1.2 mg/dL (ref 0.7–1.3)
EGFR: 60 mL/min/{1.73_m2} — AB (ref >90)
GLUCOSE: 107 mg/dL (ref 70–140)
POTASSIUM: 3.8 meq/L (ref 3.5–5.1)
SODIUM: 140 meq/L (ref 136–145)
Total Bilirubin: 0.79 mg/dL (ref 0.20–1.20)
Total Protein: 7.3 g/dL (ref 6.4–8.3)

## 2017-07-05 LAB — CBC & DIFF AND RETIC
BASO%: 0.5 % (ref 0.0–2.0)
Basophils Absolute: 0 10*3/uL (ref 0.0–0.1)
EOS%: 2.7 % (ref 0.0–7.0)
Eosinophils Absolute: 0.2 10*3/uL (ref 0.0–0.5)
HCT: 49.1 % (ref 38.4–49.9)
HGB: 16.8 g/dL (ref 13.0–17.1)
Immature Retic Fract: 7.2 % (ref 3.00–10.60)
LYMPH#: 1.2 10*3/uL (ref 0.9–3.3)
LYMPH%: 14.6 % (ref 14.0–49.0)
MCH: 34.4 pg — AB (ref 27.2–33.4)
MCHC: 34.2 g/dL (ref 32.0–36.0)
MCV: 100.4 fL — AB (ref 79.3–98.0)
MONO#: 0.8 10*3/uL (ref 0.1–0.9)
MONO%: 9.2 % (ref 0.0–14.0)
NEUT%: 73 % (ref 39.0–75.0)
NEUTROS ABS: 5.9 10*3/uL (ref 1.5–6.5)
Platelets: 393 10*3/uL (ref 140–400)
RBC: 4.89 10*6/uL (ref 4.20–5.82)
RDW: 13.9 % (ref 11.0–14.6)
Retic %: 1.47 % (ref 0.80–1.80)
Retic Ct Abs: 71.88 10*3/uL (ref 34.80–93.90)
WBC: 8.1 10*3/uL (ref 4.0–10.3)
nRBC: 0 % (ref 0–0)

## 2017-07-05 NOTE — Patient Instructions (Signed)
Thank you for choosing [facility] to provide your oncology and hematology care.  To afford each patient quality time with our providers, please arrive 30 minutes before your scheduled appointment time.  If you arrive late for your appointment, you may be asked to reschedule.  We strive to give you quality time with our providers, and arriving late affects you and other patients whose appointments are after yours.   If you are a no show for multiple scheduled visits, you may be dismissed from the clinic at the providers discretion.    Again, thank you for choosing [facility], our hope is that these requests will decrease the amount of time that you wait before being seen by our physicians.  ______________________________________________________________________  Should you have questions after your visit to the [facility], please contact our office at [phone] between the hours of 8:30 and 4:30 p.m.    Voicemails left after 4:30p.m will not be returned until the following business day.    For prescription refill requests, please have your pharmacy contact us directly.  Please also try to allow 48 hours for prescription requests.    Please contact the scheduling department for questions regarding scheduling.  For scheduling of procedures such as PET scans, CT scans, MRI, Ultrasound, etc please contact central scheduling at [phone].    Resources For Cancer Patients and Caregivers:   Oncolink.org:  A wonderful resource for patients and healthcare providers for information regarding your disease, ways to tract your treatment, what to expect, etc.     American Cancer Society:  800-227-2345  Can help patients locate various types of support and financial assistance  Cancer Care: 1-800-813-HOPE (4673) Provides financial assistance, online support groups, medication/co-pay assistance.    [place] DSS:  336-641-3447 Where to apply for food  stamps, Medicaid, and utility assistance  Medicare Rights Center: 800-333-4114 Helps people with Medicare understand their rights and benefits, navigate the Medicare system, and secure the quality healthcare they deserve  [employer]: 336-333-6589  [employer]'s shared-ride transportation service for eligible riders who have a disability that prevents them from riding the fixed route bus.    For additional information on assistance programs please contact our social worker:   Grier Hock/Abigail Elmore:  [phone]            

## 2017-07-05 NOTE — Telephone Encounter (Signed)
Scheduled appt per 8/21 los - Gave patient AVS and calender per los,.

## 2017-07-11 DIAGNOSIS — G894 Chronic pain syndrome: Secondary | ICD-10-CM | POA: Diagnosis not present

## 2017-07-11 DIAGNOSIS — M47817 Spondylosis without myelopathy or radiculopathy, lumbosacral region: Secondary | ICD-10-CM | POA: Diagnosis not present

## 2017-07-11 DIAGNOSIS — M150 Primary generalized (osteo)arthritis: Secondary | ICD-10-CM | POA: Diagnosis not present

## 2017-07-11 DIAGNOSIS — M62830 Muscle spasm of back: Secondary | ICD-10-CM | POA: Diagnosis not present

## 2017-07-11 NOTE — Progress Notes (Signed)
[name]  HEMATOLOGY ONCOLOGY PROGRESS NOTE  Date of service:  07/05/2017  Patient Care Team: [name], MD as PCP - General (Internal Medicine)  CC: f/u for ET  Diagnosis:  JAK2 Essential thrombocytosis  Current Treatment: Hydroxyurea 500 mg alternating with 1000mg  daily.  INTERVAL HISTORY:  Mr. Buenaventura is here to follow up on his Jak2 positive essential thrombocytosis.  He has no acute new concerns. No symptoms suggestive of venous/arterial thrombosis. No issues with infections. No problems tolerating the hydroxyurea.No new headaches/leg ulcers/ skin rashes or any other new concerns. He had an ultrasound that showed changes of fatty liver. Borderline splenomegaly likely related to his MPN.  Stable mild elevation of LFTs.   REVIEW OF SYSTEMS:    10 Point review of systems of done and is negative except as noted above.  Past Medical History:  Diagnosis Date  . 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   . Hypertension     Past Surgical History:  Procedure Laterality Date  . CARDIAC CATHETERIZATION     16 years ago negative   . HERNIA REPAIR     right inguinal hernia surgery   . I&D KNEE WITH POLY EXCHANGE  12/06/2011   Procedure: IRRIGATION AND DEBRIDEMENT KNEE WITH POLY EXCHANGE;  Surgeon: Mauri Pole, MD;  Location: [facility];  Service: Orthopedics;  Laterality: Right;  Right Total Knee Irrigation and Debridement with Poly Exchange  . JOINT REPLACEMENT     left knee 5/12, right TKA 10/12     Social History  Substance Use Topics  . Smoking status: Former Smoker    Packs/day: 0.25    Types: Cigarettes    Quit date: 11/16/1963  . Smokeless tobacco: Never Used  . Alcohol use 1.2 oz/week    2 Cans of beer per week    ALLERGIES:  is allergic to niacin and related.  MEDICATIONS:  Current Outpatient Prescriptions  Medication Sig Dispense  Refill  . aspirin 325 MG tablet Take 325 mg by mouth daily.     [name] atorvastatin (LIPITOR) 40 MG tablet Take 40 mg by mouth every morning.    . baclofen (LIORESAL) 10 MG tablet TAKE 1/2 TABLET BY MOUTH QID AS NEEDED  0  . chlorthalidone (HYGROTON) 25 MG tablet TK 1 T PO EVERY MORNING  3  . Cholecalciferol (VITAMIN D-3) 1000 UNITS CAPS Take 1,000 Units by mouth daily.    . hydroxyurea (HYDREA) 500 MG capsule TAKE 2 CAPSULES BY MOUTH ON ONE DAY THEN TAKE 1 CAPSULE BY MOUTH ON OTHER DAY AS DIRECTED. TAKE WITH FOOD 90 capsule 3  . hydroxyurea (HYDREA) 500 MG capsule TAKE 2 CAPSULES BY MOUTH ON ONE DAY, THEN TAKE ONE CAPSULE ON OTHER DAY AS DIRECTED WITH FOOD 90 capsule 0  . Multiple Vitamin (MULITIVITAMIN WITH MINERALS) TABS Take 2 tablets by mouth daily.     . Omega-3 Fatty Acids (SEA-OMEGA 30 PO) Take 1 capsule by mouth daily.    [name] oxyCODONE (OXY IR/ROXICODONE) 5 MG immediate release tablet TK 1.5 TS PO Q 6 H PRN  0   No current facility-administered medications for this visit.     PHYSICAL EXAMINATION: ECOG PERFORMANCE STATUS: 1 - Symptomatic but completely ambulatory  . Vitals:   07/05/17 0815  BP: 126/63  Pulse: 68  Resp: 19  Temp: 98 F (36.7 C)  SpO2: 98%    Filed Weights  07/05/17 0815  Weight: 230 lb 1.6 oz (104.4 kg)   .Body mass index is 33.02 kg/m.  GENERAL:alert, in no acute distress and comfortable SKIN: skin color, texture, turgor are normal, no rashes or significant lesions EYES: normal, conjunctiva are pink and non-injected, sclera clear OROPHARYNX:no exudate, no erythema and lips, buccal mucosa, and tongue normal  NECK: supple, no JVD, thyroid normal size, non-tender, without nodularity LYMPH:  no palpable lymphadenopathy in the cervical, axillary or inguinal LUNGS: clear to auscultation with normal respiratory effort HEART: regular rate & rhythm,  no murmurs and no lower extremity edema ABDOMEN: abdomen soft, non-tender, normoactive bowel sounds    Musculoskeletal: no cyanosis of digits and no clubbing  PSYCH: alert & oriented x 3 with fluent speech NEURO: no focal motor/sensory deficits  LABORATORY DATA:   I have reviewed the data as listed  . CBC Latest Ref Rng & Units 07/05/2017 05/03/2017 04/05/2017  WBC 4.0 - 10.3 10e3/uL 8.1 8.0 8.5  Hemoglobin 13.0 - 17.1 g/dL 16.8 16.9 16.9  Hematocrit 38.4 - 49.9 % 49.1 49.2 49.0  Platelets 140 - 400 10e3/uL 393 426(H) 451(H)    . CMP Latest Ref Rng & Units 07/05/2017 05/03/2017 04/05/2017  Glucose 70 - 140 mg/dl 107 105 98  BUN 7.0 - 26.0 mg/dL 24.4 20.3 21.2  Creatinine 0.7 - 1.3 mg/dL 1.2 1.2 1.3  Sodium 136 - 145 mEq/L 140 142 142  Potassium 3.5 - 5.1 mEq/L 3.8 4.3 4.5  Chloride 96 - 112 mEq/L - - -  CO2 22 - 29 mEq/L 28 25 28   Calcium 8.4 - 10.4 mg/dL 9.3 9.4 9.7  Total Protein 6.4 - 8.3 g/dL 7.3 7.7 8.0  Total Bilirubin 0.20 - 1.20 mg/dL 0.79 0.70 0.69  Alkaline Phos 40 - 150 U/L 84 93 104  AST 5 - 34 U/L 51(H) 48(H) 54(H)  ALT 0 - 55 U/L 75(H) 69(H) 73(H)    RADIOGRAPHIC STUDIES: I have personally reviewed the radiological images as listed and agreed with the findings in the report.  [place] abd 03/15/2017: IMPRESSION: 1. Liver is echogenic consistent with fatty infiltration and/or hepatocellular disease. No focal hepatic abnormality. No gallstones or biliary distention.  2. Spleen is enlarged at 14.4 cm with a volume 0 620 cc.  Electronically Signed   By: Marcello Moores  Register   On: 03/15/2017 09:15   ASSESSMENT & PLAN: '  71 year old Caucasian male with   #1  Jak2 positive Essential Thrombocytosis.  No previous history of thrombosis. However his age above 82 and history of hypertension places him at high risk for thrombotic events.  Platelet counts remain stable at 393k with the current dose of hydroxyurea. No evidence of thrombosis. #2 Abnormal LFTs - transaminases still slightly elevated with no significant increase. Bilirubin and alkaline phosphatase within  normal limits. Ultrasound abdomen consistent with fatty liver. Less likely to be medication related but cannot rule out some element from medications including his hydroxyurea or statins. Patient denies use of over-the-counter supplements. Denies use of alcohol. Denies using significant Tylenol. AST and ALT mildly elevated but not significantly changed. Bilirubin and alkaline phosphatase within normal limits. Plan -Patient reports no  overt toxicities from his hydroxyurea . -will continue on hydroxyurea to 500 mg alternating with 1000mg  every other day. -continue daily aspirin -He was recommended lifestyle modifications including appropriate diet and exercise to help address his fatty liver. He will need to continue follow-up with his primary doctor to continue focusing on this. -Would try to minimize hepatotoxic medications. -Continue  to monitor LFTs  C with Dr Irene Limbo in 2 months with labs  I spent 20 minutes counseling the patient face to face. The total time spent in the appointment was 25 minutes and more than 50% was on counseling and direct patient cares.    Sullivan Lone MD [place] AAHIVMS [facility] Hematology/Oncology Physician [facility]  (Office):       [phone] (Work cell):  [phone] (Fax):           [phone]

## 2017-08-16 DIAGNOSIS — Z1389 Encounter for screening for other disorder: Secondary | ICD-10-CM | POA: Diagnosis not present

## 2017-08-16 DIAGNOSIS — D473 Essential (hemorrhagic) thrombocythemia: Secondary | ICD-10-CM | POA: Diagnosis not present

## 2017-08-16 DIAGNOSIS — Z0000 Encounter for general adult medical examination without abnormal findings: Secondary | ICD-10-CM | POA: Diagnosis not present

## 2017-08-16 DIAGNOSIS — R945 Abnormal results of liver function studies: Secondary | ICD-10-CM | POA: Diagnosis not present

## 2017-08-16 DIAGNOSIS — I10 Essential (primary) hypertension: Secondary | ICD-10-CM | POA: Diagnosis not present

## 2017-09-02 DIAGNOSIS — L723 Sebaceous cyst: Secondary | ICD-10-CM | POA: Diagnosis not present

## 2017-09-05 ENCOUNTER — Ambulatory Visit (HOSPITAL_BASED_OUTPATIENT_CLINIC_OR_DEPARTMENT_OTHER): Payer: Medicare Other | Admitting: Hematology

## 2017-09-05 ENCOUNTER — Telehealth: Payer: Self-pay

## 2017-09-05 ENCOUNTER — Encounter: Payer: Self-pay | Admitting: Hematology

## 2017-09-05 ENCOUNTER — Other Ambulatory Visit (HOSPITAL_BASED_OUTPATIENT_CLINIC_OR_DEPARTMENT_OTHER): Payer: Medicare Other

## 2017-09-05 VITALS — BP 155/81 | HR 68 | Temp 97.7°F | Resp 18 | Ht 70.0 in | Wt 230.6 lb

## 2017-09-05 DIAGNOSIS — D473 Essential (hemorrhagic) thrombocythemia: Secondary | ICD-10-CM

## 2017-09-05 LAB — COMPREHENSIVE METABOLIC PANEL
ALBUMIN: 4.1 g/dL (ref 3.5–5.0)
ALK PHOS: 72 U/L (ref 40–150)
ALT: 75 U/L — AB (ref 0–55)
AST: 48 U/L — AB (ref 5–34)
Anion Gap: 11 mEq/L (ref 3–11)
BILIRUBIN TOTAL: 0.76 mg/dL (ref 0.20–1.20)
BUN: 18.4 mg/dL (ref 7.0–26.0)
CO2: 29 mEq/L (ref 22–29)
CREATININE: 1.1 mg/dL (ref 0.7–1.3)
Calcium: 9.5 mg/dL (ref 8.4–10.4)
Chloride: 101 mEq/L (ref 98–109)
EGFR: >60 mL/min/{1.73_m2} (ref >60)
GLUCOSE: 99 mg/dL (ref 70–140)
POTASSIUM: 4.2 meq/L (ref 3.5–5.1)
SODIUM: 141 meq/L (ref 136–145)
TOTAL PROTEIN: 7.5 g/dL (ref 6.4–8.3)

## 2017-09-05 LAB — CBC & DIFF AND RETIC
BASO%: 0.5 % (ref 0.0–2.0)
Basophils Absolute: 0 10*3/uL (ref 0.0–0.1)
EOS ABS: 0.3 10*3/uL (ref 0.0–0.5)
EOS%: 2.9 % (ref 0.0–7.0)
HCT: 49.8 % (ref 38.4–49.9)
HEMOGLOBIN: 17 g/dL (ref 13.0–17.1)
IMMATURE RETIC FRACT: 10.8 % — AB (ref 3.00–10.60)
LYMPH%: 14.5 % (ref 14.0–49.0)
MCH: 34.1 pg — ABNORMAL HIGH (ref 27.2–33.4)
MCHC: 34.1 g/dL (ref 32.0–36.0)
MCV: 99.8 fL — ABNORMAL HIGH (ref 79.3–98.0)
MONO#: 0.9 10*3/uL (ref 0.1–0.9)
MONO%: 10.5 % (ref 0.0–14.0)
NEUT%: 71.6 % (ref 39.0–75.0)
NEUTROS ABS: 6.1 10*3/uL (ref 1.5–6.5)
Platelets: 391 10*3/uL (ref 140–400)
RBC: 4.99 10*6/uL (ref 4.20–5.82)
RDW: 14.2 % (ref 11.0–14.6)
RETIC %: 1.86 % — AB (ref 0.80–1.80)
Retic Ct Abs: 92.81 10*3/uL (ref 34.80–93.90)
WBC: 8.6 10*3/uL (ref 4.0–10.3)
lymph#: 1.2 10*3/uL (ref 0.9–3.3)

## 2017-09-05 MED ORDER — HYDROXYUREA 500 MG PO CAPS: ORAL_CAPSULE | ORAL | 3 refills | Status: DC

## 2017-09-05 NOTE — Progress Notes (Signed)
Jordan Vazquez  HEMATOLOGY ONCOLOGY PROGRESS NOTE  Date of service: 09/05/17   Patient Care Team: [name], MD as PCP - General (Internal Medicine)  CC: f/u for ET  Diagnosis:  JAK2 Essential thrombocytosis  Current Treatment: Hydroxyurea 500 mg alternating with 1000mg  daily.  INTERVAL HISTORY: Mr. Maiden is here to follow up on his Jak2 positive essential thrombocytosis. His labs are stable at this time. He reports that he is doing well overall and has no acute new concerns. Mild elevation of LFTs which the pt was advised to stay active and monitor diet and minimize alcohol use. No prohibitive toxicities from hydroxyurea at this time.                  On review of systems, pt denies fever, chills, mouth sores, abdominal pain , leg ulcers, and any acute new symptoms.     REVIEW OF SYSTEMS:    10 Point review of systems of done and is negative except as noted above.  Past Medical History:  Diagnosis Date  . 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 Essential thrombocytosis ([place]) 09/19/2015   Jak2 V617F mutation positive Essential thrombocytosis -On Hydroxyurea.   . Hypertension     Past Surgical History:  Procedure Laterality Date  . CARDIAC CATHETERIZATION     16 years ago negative   . HERNIA REPAIR     right inguinal hernia surgery   . I&D KNEE WITH POLY EXCHANGE  12/06/2011   Procedure: IRRIGATION AND DEBRIDEMENT KNEE WITH POLY EXCHANGE;  Surgeon: Mauri Pole, MD;  Location: [facility];  Service: Orthopedics;  Laterality: Right;  Right Total Knee Irrigation and Debridement with Poly Exchange  . JOINT REPLACEMENT     left knee 5/12, right TKA 10/12     Social History  Substance Use Topics  . Smoking status: Former Smoker    Packs/day: 0.25    Types: Cigarettes    Quit date: 11/16/1963  . Smokeless tobacco: Never Used  . Alcohol use 1.2 oz/week    2 Cans of beer per week    ALLERGIES:  is allergic to niacin and related.  MEDICATIONS:  Current Outpatient  Prescriptions  Medication Sig Dispense Refill  . aspirin 325 MG tablet Take 325 mg by mouth daily.     Jordan Vazquez atorvastatin (LIPITOR) 40 MG tablet Take 40 mg by mouth every morning.    . baclofen (LIORESAL) 10 MG tablet TAKE 1/2 TABLET BY MOUTH QID AS NEEDED  0  . chlorthalidone (HYGROTON) 25 MG tablet TK 1 T PO EVERY MORNING  3  . Cholecalciferol (VITAMIN D-3) 1000 UNITS CAPS Take 1,000 Units by mouth daily.    . hydroxyurea (HYDREA) 500 MG capsule TAKE 2 CAPSULES BY MOUTH ON ONE DAY THEN TAKE 1 CAPSULE BY MOUTH ON OTHER DAY AS DIRECTED. TAKE WITH FOOD 90 capsule 3  . Multiple Vitamin (MULITIVITAMIN WITH MINERALS) TABS Take 2 tablets by mouth daily.     . Omega-3 Fatty Acids (SEA-OMEGA 30 PO) Take 1 capsule by mouth daily.    Jordan Vazquez oxyCODONE (OXY IR/ROXICODONE) 5 MG immediate release tablet TK 1.5 TS PO Q 6 H PRN  0   No current facility-administered medications for this visit.     PHYSICAL EXAMINATION: ECOG PERFORMANCE STATUS: 1 - Symptomatic but completely ambulatory  . Vitals:   09/05/17 0910  BP: (!) 155/81  Pulse: 68  Resp: 18  Temp: 97.7 F (36.5 C)  SpO2: 97%  Filed Weights   09/05/17 0910  Weight: 230 lb 9.6 oz (104.6 kg)   .Body mass index is 33.09 kg/m.  GENERAL:alert, in no acute distress and comfortable SKIN: skin color, texture, turgor are normal, no rashes or significant lesions EYES: normal, conjunctiva are pink and non-injected, sclera clear OROPHARYNX:no exudate, no erythema and lips, buccal mucosa, and tongue normal  NECK: supple, no JVD, thyroid normal size, non-tender, without nodularity LYMPH:  no palpable lymphadenopathy in the cervical, axillary or inguinal LUNGS: clear to auscultation with normal respiratory effort HEART: regular rate & rhythm,  no murmurs and no lower extremity edema ABDOMEN: abdomen soft, non-tender, normoactive bowel sounds  Musculoskeletal: no cyanosis of digits and no clubbing  PSYCH: alert & oriented x 3 with fluent speech NEURO:  no focal motor/sensory deficits  LABORATORY DATA:   I have reviewed the data as listed  . CBC Latest Ref Rng & Units 09/05/2017 07/05/2017 05/03/2017  WBC 4.0 - 10.3 10e3/uL 8.6 8.1 8.0  Hemoglobin 13.0 - 17.1 g/dL 17.0 16.8 16.9  Hematocrit 38.4 - 49.9 % 49.8 49.1 49.2  Platelets 140 - 400 10e3/uL 391 393 426(H)    . CMP Latest Ref Rng & Units 09/05/2017 07/05/2017 05/03/2017  Glucose 70 - 140 mg/dl 99 107 105  BUN 7.0 - 26.0 mg/dL 18.4 24.4 20.3  Creatinine 0.7 - 1.3 mg/dL 1.1 1.2 1.2  Sodium 136 - 145 mEq/L 141 140 142  Potassium 3.5 - 5.1 mEq/L 4.2 3.8 4.3  Chloride 96 - 112 mEq/L - - -  CO2 22 - 29 mEq/L 29 28 25   Calcium 8.4 - 10.4 mg/dL 9.5 9.3 9.4  Total Protein 6.4 - 8.3 g/dL 7.5 7.3 7.7  Total Bilirubin 0.20 - 1.20 mg/dL 0.76 0.79 0.70  Alkaline Phos 40 - 150 U/L 72 84 93  AST 5 - 34 U/L 48(H) 51(H) 48(H)  ALT 0 - 55 U/L 75(H) 75(H) 69(H)    RADIOGRAPHIC STUDIES: I have personally reviewed the radiological images as listed and agreed with the findings in the report.  [place] abd 03/15/2017: IMPRESSION: 1. Liver is echogenic consistent with fatty infiltration and/or hepatocellular disease. No focal hepatic abnormality. No gallstones or biliary distention.  2. Spleen is enlarged at 14.4 cm with a volume 0 620 cc.  Electronically Signed   By: Marcello Moores  Register   On: 03/15/2017 09:15   ASSESSMENT & PLAN: '  71 year old Caucasian male with   #1  Jak2 positive Essential Thrombocytosis.  No previous history of thrombosis. However his age above 71 and history of hypertension places him at high risk for thrombotic events.  Platelet counts remain stable at 391k with the current dose of hydroxyurea. No evidence of thrombosis. #2 Abnormal LFTs - transaminases still slightly elevated with no significant increase. Bilirubin and alkaline phosphatase within normal limits. Ultrasound abdomen consistent with fatty liver. Less likely to be medication related but cannot rule out  some element from medications including his hydroxyurea or statins. Patient denies use of over-the-counter supplements. Denies use of alcohol. Denies using significant Tylenol. AST and ALT mildly elevated but not significantly changed. Bilirubin and alkaline phosphatase within normal limits. Plan -Patient reports no  overt toxicities from his hydroxyurea . -will continue on hydroxyurea to 500 mg alternating with 1000mg  every other day. -continue daily aspirin -He was again recommended lifestyle modifications including appropriate diet and exercise to help address his fatty liver.  -Would try to minimize hepatotoxic medications. -Continue to monitor LFTs   RTC with Dr Irene Limbo in  2 months with labs  I spent 20 minutes counseling the patient face to face. The total time spent in the appointment was 25 minutes and more than 50% was on counseling and direct patient cares.    Sullivan Lone MD [place] AAHIVMS [facility] Hematology/Oncology Physician [place]  (Office):       [phone] (Work cell):  [phone] (Fax):           [phone]  This document serves as a record of services personally performed by Sullivan Lone, MD. It was created on her behalf by Alean Rinne, a trained medical scribe. The creation of this record is based on the scribe's personal observations and the provider's statements to them. This document has been checked and approved by the attending provider.

## 2017-09-05 NOTE — Telephone Encounter (Signed)
Patient was in the office and I printed avs and calender for upcoming appointment. After speaking with Dr. Irene Limbo patient was approved by provider to place hime in 8:40 am slot due to medication. Per 10/22 los, and phone call.

## 2017-09-06 DIAGNOSIS — M62830 Muscle spasm of back: Secondary | ICD-10-CM | POA: Diagnosis not present

## 2017-09-06 DIAGNOSIS — M47817 Spondylosis without myelopathy or radiculopathy, lumbosacral region: Secondary | ICD-10-CM | POA: Diagnosis not present

## 2017-09-06 DIAGNOSIS — G894 Chronic pain syndrome: Secondary | ICD-10-CM | POA: Diagnosis not present

## 2017-09-06 DIAGNOSIS — M150 Primary generalized (osteo)arthritis: Secondary | ICD-10-CM | POA: Diagnosis not present

## 2017-10-31 ENCOUNTER — Telehealth: Payer: Self-pay | Admitting: Hematology

## 2017-10-31 ENCOUNTER — Ambulatory Visit (HOSPITAL_BASED_OUTPATIENT_CLINIC_OR_DEPARTMENT_OTHER): Payer: Medicare Other | Admitting: Hematology

## 2017-10-31 ENCOUNTER — Other Ambulatory Visit (HOSPITAL_BASED_OUTPATIENT_CLINIC_OR_DEPARTMENT_OTHER): Payer: Medicare Other

## 2017-10-31 ENCOUNTER — Encounter: Payer: Self-pay | Admitting: Hematology

## 2017-10-31 ENCOUNTER — Ambulatory Visit: Payer: Medicare Other | Admitting: Hematology

## 2017-10-31 ENCOUNTER — Ambulatory Visit: Payer: Medicare Other | Admitting: Oncology

## 2017-10-31 VITALS — BP 142/88 | HR 63 | Temp 97.7°F | Resp 18 | Ht 70.0 in | Wt 233.5 lb

## 2017-10-31 DIAGNOSIS — D473 Essential (hemorrhagic) thrombocythemia: Secondary | ICD-10-CM

## 2017-10-31 DIAGNOSIS — Z7982 Long term (current) use of aspirin: Secondary | ICD-10-CM

## 2017-10-31 DIAGNOSIS — R7989 Other specified abnormal findings of blood chemistry: Secondary | ICD-10-CM

## 2017-10-31 DIAGNOSIS — R945 Abnormal results of liver function studies: Secondary | ICD-10-CM

## 2017-10-31 LAB — COMPREHENSIVE METABOLIC PANEL
ALT: 78 U/L — AB (ref 0–55)
ANION GAP: 9 meq/L (ref 3–11)
AST: 54 U/L — AB (ref 5–34)
Albumin: 4.1 g/dL (ref 3.5–5.0)
Alkaline Phosphatase: 81 U/L (ref 40–150)
BILIRUBIN TOTAL: 0.85 mg/dL (ref 0.20–1.20)
BUN: 20.7 mg/dL (ref 7.0–26.0)
CHLORIDE: 103 meq/L (ref 98–109)
CO2: 29 meq/L (ref 22–29)
CREATININE: 1.1 mg/dL (ref 0.7–1.3)
Calcium: 9.4 mg/dL (ref 8.4–10.4)
EGFR: >60 mL/min/{1.73_m2} (ref >60)
GLUCOSE: 94 mg/dL (ref 70–140)
Potassium: 4.6 mEq/L (ref 3.5–5.1)
SODIUM: 141 meq/L (ref 136–145)
TOTAL PROTEIN: 7.4 g/dL (ref 6.4–8.3)

## 2017-10-31 LAB — CBC & DIFF AND RETIC
BASO%: 0.6 % (ref 0.0–2.0)
Basophils Absolute: 0.1 10*3/uL (ref 0.0–0.1)
EOS ABS: 0.2 10*3/uL (ref 0.0–0.5)
EOS%: 2.5 % (ref 0.0–7.0)
HCT: 49.9 % (ref 38.4–49.9)
HGB: 17.3 g/dL — ABNORMAL HIGH (ref 13.0–17.1)
IMMATURE RETIC FRACT: 9.9 % (ref 3.00–10.60)
LYMPH#: 1.1 10*3/uL (ref 0.9–3.3)
LYMPH%: 13.3 % — AB (ref 14.0–49.0)
MCH: 34.6 pg — ABNORMAL HIGH (ref 27.2–33.4)
MCHC: 34.7 g/dL (ref 32.0–36.0)
MCV: 99.8 fL — ABNORMAL HIGH (ref 79.3–98.0)
MONO#: 1.1 10*3/uL — AB (ref 0.1–0.9)
MONO%: 13 % (ref 0.0–14.0)
NEUT%: 70.6 % (ref 39.0–75.0)
NEUTROS ABS: 6 10*3/uL (ref 1.5–6.5)
PLATELETS: 418 10*3/uL — AB (ref 140–400)
RBC: 5 10*6/uL (ref 4.20–5.82)
RDW: 14.4 % (ref 11.0–14.6)
RETIC %: 1.51 % (ref 0.80–1.80)
RETIC CT ABS: 75.5 10*3/uL (ref 34.80–93.90)
WBC: 8.5 10*3/uL (ref 4.0–10.3)
nRBC: 0 % (ref 0–0)

## 2017-10-31 LAB — LACTATE DEHYDROGENASE: LDH: 275 U/L — ABNORMAL HIGH (ref 125–245)

## 2017-10-31 MED ORDER — HYDROXYUREA 500 MG PO CAPS: ORAL_CAPSULE | ORAL | 3 refills | Status: DC

## 2017-10-31 NOTE — Telephone Encounter (Signed)
Gave avs and calendar for February and April 2019

## 2017-11-01 DIAGNOSIS — M150 Primary generalized (osteo)arthritis: Secondary | ICD-10-CM | POA: Diagnosis not present

## 2017-11-01 DIAGNOSIS — M62830 Muscle spasm of back: Secondary | ICD-10-CM | POA: Diagnosis not present

## 2017-11-01 DIAGNOSIS — G894 Chronic pain syndrome: Secondary | ICD-10-CM | POA: Diagnosis not present

## 2017-11-01 DIAGNOSIS — M47817 Spondylosis without myelopathy or radiculopathy, lumbosacral region: Secondary | ICD-10-CM | POA: Diagnosis not present

## 2017-11-14 NOTE — Progress Notes (Signed)
[name]  HEMATOLOGY ONCOLOGY PROGRESS NOTE  Date of service: .10/31/2017   Patient Care Team: [name], MD as PCP - General (Internal Medicine)  CC: f/u for ET  Diagnosis:  JAK2 Essential thrombocytosis  Current Treatment: Hydroxyurea 500 mg alternating with 1000mg  daily.  INTERVAL HISTORY:  Jordan Vazquez is here to follow up on his Jak2 positive essential thrombocytosis. His labs are stable at this time. He reports that he is doing well overall and has no acute new concerns. Mild elevation of LFTs which the pt was advised to stay active and monitor diet and minimize alcohol use to treat fatty liver. No prohibitive toxicities from hydroxyurea at this time.                  On review of systems, pt denies fever, chills, mouth sores, abdominal pain , leg ulcers, and any acute new symptoms.    REVIEW OF SYSTEMS:    10 Point review of systems of done and is negative except as noted above.  Past Medical History:  Diagnosis Date  . 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   . Hypertension     Past Surgical History:  Procedure Laterality Date  . CARDIAC CATHETERIZATION     16 years ago negative   . HERNIA REPAIR     right inguinal hernia surgery   . I&D KNEE WITH POLY EXCHANGE  12/06/2011   Procedure: IRRIGATION AND DEBRIDEMENT KNEE WITH POLY EXCHANGE;  Surgeon: Mauri Pole, MD;  Location: [facility];  Service: Orthopedics;  Laterality: Right;  Right Total Knee Irrigation and Debridement with Poly Exchange  . JOINT REPLACEMENT     left knee 5/12, right TKA 10/12     Social History   Tobacco Use  . Smoking status: Former Smoker    Packs/day: 0.25    Types: Cigarettes    Last attempt to quit: 11/16/1963    Years since quitting: 54.0  . Smokeless tobacco: Never Used  Substance Use Topics  . Alcohol use: Yes    Alcohol/week: 1.2 oz    Types: 2 Cans  of beer per week  . Drug use: No    ALLERGIES:  is allergic to niacin and related.  MEDICATIONS:  Current Outpatient Medications  Medication Sig Dispense Refill  . aspirin 325 MG tablet Take 325 mg by mouth daily.     [name] atorvastatin (LIPITOR) 40 MG tablet Take 40 mg by mouth every morning.    . baclofen (LIORESAL) 10 MG tablet TAKE 1/2 TABLET BY MOUTH QID AS NEEDED  0  . chlorthalidone (HYGROTON) 25 MG tablet TK 1 T PO EVERY MORNING  3  . Cholecalciferol (VITAMIN D-3) 1000 UNITS CAPS Take 1,000 Units by mouth daily.    . hydroxyurea (HYDREA) 500 MG capsule TAKE 2 CAPSULES BY MOUTH Mon through Thursday THEN TAKE 1 CAPSULE BY MOUTH ON OTHER DAYS. TAKE WITH FOOD 90 capsule 3  . Multiple Vitamin (MULITIVITAMIN WITH MINERALS) TABS Take 2 tablets by mouth daily.     . Omega-3 Fatty Acids (SEA-OMEGA 30 PO) Take 1 capsule by mouth daily.    [name] oxyCODONE (OXY IR/ROXICODONE) 5 MG immediate release tablet TK 1.5 TS PO Q 6 H PRN  0   No current facility-administered medications for this visit.     PHYSICAL EXAMINATION: ECOG PERFORMANCE STATUS: 1 - Symptomatic but completely ambulatory  . Vitals:  10/31/17 0823  BP: (!) 142/88  Pulse: 63  Resp: 18  Temp: 97.7 F (36.5 C)  SpO2: 97%    Filed Weights   10/31/17 0823  Weight: 233 lb 8 oz (105.9 kg)   .Body mass index is 33.5 kg/m.  GENERAL:alert, in no acute distress and comfortable SKIN: skin color, texture, turgor are normal, no rashes or significant lesions EYES: normal, conjunctiva are pink and non-injected, sclera clear OROPHARYNX:no exudate, no erythema and lips, buccal mucosa, and tongue normal  NECK: supple, no JVD, thyroid normal size, non-tender, without nodularity LYMPH:  no palpable lymphadenopathy in the cervical, axillary or inguinal LUNGS: clear to auscultation with normal respiratory effort HEART: regular rate & rhythm,  no murmurs and no lower extremity edema ABDOMEN: abdomen soft, non-tender, normoactive bowel  sounds  Musculoskeletal: no cyanosis of digits and no clubbing  PSYCH: alert & oriented x 3 with fluent speech NEURO: no focal motor/sensory deficits  LABORATORY DATA:   I have reviewed the data as listed  . CBC Latest Ref Rng & Units 10/31/2017 09/05/2017 07/05/2017  WBC 4.0 - 10.3 10e3/uL 8.5 8.6 8.1  Hemoglobin 13.0 - 17.1 g/dL 17.3(H) 17.0 16.8  Hematocrit 38.4 - 49.9 % 49.9 49.8 49.1  Platelets 140 - 400 10e3/uL 418(H) 391 393    . CMP Latest Ref Rng & Units 10/31/2017 09/05/2017 07/05/2017  Glucose 70 - 140 mg/dl 94 99 107  BUN 7.0 - 26.0 mg/dL 20.7 18.4 24.4  Creatinine 0.7 - 1.3 mg/dL 1.1 1.1 1.2  Sodium 136 - 145 mEq/L 141 141 140  Potassium 3.5 - 5.1 mEq/L 4.6 4.2 3.8  Chloride 96 - 112 mEq/L - - -  CO2 22 - 29 mEq/L 29 29 28   Calcium 8.4 - 10.4 mg/dL 9.4 9.5 9.3  Total Protein 6.4 - 8.3 g/dL 7.4 7.5 7.3  Total Bilirubin 0.20 - 1.20 mg/dL 0.85 0.76 0.79  Alkaline Phos 40 - 150 U/L 81 72 84  AST 5 - 34 U/L 54(H) 48(H) 51(H)  ALT 0 - 55 U/L 78(H) 75(H) 75(H)    RADIOGRAPHIC STUDIES: I have personally reviewed the radiological images as listed and agreed with the findings in the report.  [place] abd 03/15/2017: IMPRESSION: 1. Liver is echogenic consistent with fatty infiltration and/or hepatocellular disease. No focal hepatic abnormality. No gallstones or biliary distention.  2. Spleen is enlarged at 14.4 cm with a volume 0 620 cc.  Electronically Signed   By: Marcello Moores  Register   On: 03/15/2017 09:15   ASSESSMENT & PLAN: '  71 year old Caucasian male with   #1  Jak2 positive Essential Thrombocytosis.  No previous history of thrombosis. However his age above 26 and history of hypertension places him at high risk for thrombotic events.  Platelet counts are a little higher at 418k with the current dose of hydroxyurea. No evidence of thrombosis. #2 Abnormal LFTs - transaminases still slightly elevated with no significant increase. Bilirubin and alkaline  phosphatase within normal limits. Ultrasound abdomen consistent with fatty liver. Less likely to be medication related but cannot rule out some element from medications including his hydroxyurea or statins. Patient denies use of over-the-counter supplements. Denies use of alcohol. Denies using significant Tylenol. AST and ALT mildly elevated but not significantly changed. Bilirubin and alkaline phosphatase within normal limits. Plan -Patient reports no  overt toxicities from his hydroxyurea . -will continue on hydroxyurea  1000mg  Mon through Thursday (4 days a week) and 500mg  po daily on the rest of the days of the  week.. -continue daily aspirin -He was again recommended lifestyle modifications including appropriate diet and exercise to help address his fatty liver.  -Would try to minimize hepatotoxic medications. -Continue to monitor LFTs   RTC with Dr Irene Limbo in 2 months with labsRTC with Dr Irene Limbo every 2 months with labs (next setup next 2 appointments)   I spent 20 minutes counseling the patient face to face. The total time spent in the appointment was 20 minutes and more than 50% was on counseling and direct patient cares.    Sullivan Lone MD [place] AAHIVMS [facility] Hematology/Oncology Physician [facility]  (Office):       [phone] (Work cell):  [phone] (Fax):           [phone]

## 2017-12-29 NOTE — Progress Notes (Signed)
Jordan Vazquez  HEMATOLOGY ONCOLOGY PROGRESS NOTE  Date of service: 01/02/18    Patient Care Team: [name], MD as PCP - General (Internal Medicine)  CC F/u for mx of essential thrombocythemia  Diagnosis:  JAK2 Essential thrombocytosis   Current Treatment: Hydroxyurea 1000 mg mon through Friday and 500mg  po on sat and sun  INTERVAL HISTORY:  Jordan Vazquez is here to follow up on his Jak2 positive essential thrombocytosis. The patient's last visit with [place] was on 10/31/17. The pt reports that he is doing well overall and has had no acute new concerns.   Lab results today (01/02/18) of CBC, CMP, and Reticulocytes is as follows: all values are WNL except for MCV at 101.8, MCH at 34.6, Platelets at 404k, Monocytes abs at 1.1k, CO2 at 31, AST at 50, ALT at 77, and Retic Ct Pct at 1.9.   On review of systems, pt reports no new symptoms and denies signs of infections, and fevers and no abnormal bruising.  We discussed and patient is agreeable to increase to Hydroxyurea 1000 mg mon through Friday and 500mg  po on sat and sun  REVIEW OF SYSTEMS:   .10 Point review of Systems was done is negative except as noted above.  Past Medical History:  Diagnosis Date  . 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 Essential thrombocytosis ([place]) 09/19/2015   Jak2 V617F mutation positive Essential thrombocytosis -On Hydroxyurea.   . Hypertension     Past Surgical History:  Procedure Laterality Date  . CARDIAC CATHETERIZATION     16 years ago negative   . HERNIA REPAIR     right inguinal hernia surgery   . I&D KNEE WITH POLY EXCHANGE  12/06/2011   Procedure: IRRIGATION AND DEBRIDEMENT KNEE WITH POLY EXCHANGE;  Surgeon: Mauri Pole, MD;  Location: [facility];  Service: Orthopedics;  Laterality: Right;  Right Total Knee Irrigation and Debridement with Poly Exchange  . JOINT REPLACEMENT     left knee 5/12, right TKA 10/12     Social History   Tobacco Use  . Smoking status: Former Smoker   Packs/day: 0.25    Types: Cigarettes    Last attempt to quit: 11/16/1963    Years since quitting: 54.1  . Smokeless tobacco: Never Used  Substance Use Topics  . Alcohol use: Yes    Alcohol/week: 1.2 oz    Types: 2 Cans of beer per week  . Drug use: No    ALLERGIES:  is allergic to niacin and related.  MEDICATIONS:  Current Outpatient Medications  Medication Sig Dispense Refill  . aspirin 325 MG tablet Take 325 mg by mouth daily.     Jordan Vazquez atorvastatin (LIPITOR) 40 MG tablet Take 40 mg by mouth every morning.    . baclofen (LIORESAL) 10 MG tablet TAKE 1/2 TABLET BY MOUTH QID AS NEEDED  0  . chlorthalidone (HYGROTON) 25 MG tablet TK 1 T PO EVERY MORNING  3  . Cholecalciferol (VITAMIN D-3) 1000 UNITS CAPS Take 1,000 Units by mouth daily.    . hydroxyurea (HYDREA) 500 MG capsule TAKE 2 CAPSULES BY MOUTH Mon through Thursday THEN TAKE 1 CAPSULE BY MOUTH ON OTHER DAYS. TAKE WITH FOOD 90 capsule 3  . Multiple Vitamin (MULITIVITAMIN WITH MINERALS) TABS Take 2 tablets by mouth daily.     . Omega-3 Fatty Acids (SEA-OMEGA 30 PO) Take 1 capsule by mouth daily.    Jordan Vazquez oxyCODONE (OXY IR/ROXICODONE) 5 MG immediate release tablet TK 1.5 TS  PO Q 6 H PRN  0   No current facility-administered medications for this visit.     PHYSICAL EXAMINATION: ECOG PERFORMANCE STATUS: 1 - Symptomatic but completely ambulatory  .Jordan Vazquez Vitals:   01/02/18 0859  BP: (!) 146/83  Pulse: (!) 58  Resp: 16  Temp: 97.7 F (36.5 C)  SpO2: 98%    Filed Weights   01/02/18 0859  Weight: 236 lb (107 kg)   .Body mass index is 33.86 kg/m.  GENERAL:alert, in no acute distress and comfortable SKIN: no acute rashes, no significant lesions EYES: conjunctiva are pink and non-injected, sclera anicteric OROPHARYNX: MMM, no exudates, no oropharyngeal erythema or ulceration NECK: supple, no JVD LYMPH:  no palpable lymphadenopathy in the cervical, axillary or inguinal regions LUNGS: clear to auscultation b/l with normal respiratory  effort HEART: regular rate & rhythm ABDOMEN:  normoactive bowel sounds , non tender, not distended. No palpable splenomegaly. Extremity: no pedal edema PSYCH: alert & oriented x 3 with fluent speech NEURO: no focal motor/sensory deficits   LABORATORY DATA:   I have reviewed the data as listed  . CBC Latest Ref Rng & Units 01/02/2018 10/31/2017 09/05/2017  WBC 4.0 - 10.3 K/uL 7.7 8.5 8.6  Hemoglobin 13.0 - 17.1 g/dL - 17.3(H) 17.0  Hematocrit 38.4 - 49.9 % 49.8 49.9 49.8  Platelets 140 - 400 K/uL 404(H) 418(H) 391    . CMP Latest Ref Rng & Units 01/02/2018 10/31/2017 09/05/2017  Glucose 70 - 140 mg/dL 88 94 99  BUN 7 - 26 mg/dL 21 20.7 18.4  Creatinine 0.70 - 1.30 mg/dL 1.07 1.1 1.1  Sodium 136 - 145 mmol/L 143 141 141  Potassium 3.5 - 5.1 mmol/L 4.4 4.6 4.2  Chloride 98 - 109 mmol/L 103 - -  CO2 22 - 29 mmol/L 31(H) 29 29  Calcium 8.4 - 10.4 mg/dL 9.7 9.4 9.5  Total Protein 6.4 - 8.3 g/dL 7.5 7.4 7.5  Total Bilirubin 0.2 - 1.2 mg/dL 0.9 0.85 0.76  Alkaline Phos 40 - 150 U/L 73 81 72  AST 5 - 34 U/L 50(H) 54(H) 48(H)  ALT 0 - 55 U/L 77(H) 78(H) 75(H)    RADIOGRAPHIC STUDIES: I have personally reviewed the radiological images as listed and agreed with the findings in the report.  [place] abd 03/15/2017: IMPRESSION: 1. Liver is echogenic consistent with fatty infiltration and/or hepatocellular disease. No focal hepatic abnormality. No gallstones or biliary distention.  2. Spleen is enlarged at 14.4 cm with a volume of 620 cc.  Electronically Signed   By: Marcello Moores  Register   On: 03/15/2017 09:15   ASSESSMENT & PLAN: '  72 y.o.  Caucasian male with   #1  Jak2 positive Essential Thrombocytosis.  -Discussed pt labwork today- blood counts and chemistries are stable. Hgb holding well.  Platelet at 404k. Plan -recommened pt to take 1000mg  Hydroxyurea Mo-Fr, and 500mg  Hydroxyurea Sat-Sun (slight increase from1000mg  4 days a week and 500mg  on fri/sat/sun) -Advised active  sunscreen protection as the weather begins to warm due to the potential harmful effects with his Hydroxyurea.  -maintain good po hydration. -no issues with toxicities from hydroxyurea at this time.  #2 Abnormal LFTs -Liver functions unchanged but AST and ALT still a little elevated, most likely related to his fatty liver. -will continue to monitor.   RTC with Dr Irene Limbo in 2 months with labs  . The total time spent in the appointment was 15 minutes and more than 50% was on counseling and direct patient cares.  Sullivan Lone MD [place] AAHIVMS [facility] Hematology/Oncology Physician [place]  (Office):       [phone] (Work cell):  [phone] (Fax):           [phone]  This document serves as a record of services personally performed by Sullivan Lone, MD. It was created on his behalf by Baldwin [place], a trained medical scribe. The creation of this record is based on the scribe's personal observations and the provider's statements to them.   .I have reviewed the above documentation for accuracy and completeness, and I agree with the above. Brunetta Genera MD MS

## 2018-01-02 ENCOUNTER — Inpatient Hospital Stay: Payer: Medicare Other | Attending: Hematology | Admitting: Hematology

## 2018-01-02 ENCOUNTER — Encounter: Payer: Self-pay | Admitting: Hematology

## 2018-01-02 ENCOUNTER — Inpatient Hospital Stay: Payer: Medicare Other

## 2018-01-02 VITALS — BP 146/83 | HR 58 | Temp 97.7°F | Resp 16 | Wt 236.0 lb

## 2018-01-02 DIAGNOSIS — D473 Essential (hemorrhagic) thrombocythemia: Secondary | ICD-10-CM | POA: Diagnosis not present

## 2018-01-02 DIAGNOSIS — Z87891 Personal history of nicotine dependence: Secondary | ICD-10-CM

## 2018-01-02 DIAGNOSIS — I10 Essential (primary) hypertension: Secondary | ICD-10-CM

## 2018-01-02 DIAGNOSIS — Z79899 Other long term (current) drug therapy: Secondary | ICD-10-CM

## 2018-01-02 DIAGNOSIS — Z7982 Long term (current) use of aspirin: Secondary | ICD-10-CM

## 2018-01-02 LAB — CBC WITH DIFFERENTIAL (CANCER CENTER ONLY)
BASOS ABS: 0.1 10*3/uL (ref 0.0–0.1)
Basophils Relative: 1 %
EOS PCT: 3 %
Eosinophils Absolute: 0.2 10*3/uL (ref 0.0–0.5)
HEMATOCRIT: 49.8 % (ref 38.4–49.9)
Hemoglobin: 16.9 g/dL (ref 13.0–17.1)
LYMPHS ABS: 1.1 10*3/uL (ref 0.9–3.3)
LYMPHS PCT: 14 %
MCH: 34.6 pg — ABNORMAL HIGH (ref 27.2–33.4)
MCHC: 33.9 g/dL (ref 32.0–36.0)
MCV: 101.8 fL — AB (ref 79.3–98.0)
MONO ABS: 1.1 10*3/uL — AB (ref 0.1–0.9)
MONOS PCT: 14 %
NEUTROS ABS: 5.4 10*3/uL (ref 1.5–6.5)
Neutrophils Relative %: 68 %
Platelet Count: 404 10*3/uL — ABNORMAL HIGH (ref 140–400)
RBC: 4.89 MIL/uL (ref 4.20–5.82)
RDW: 14.5 % (ref 11.0–14.6)
WBC Count: 7.7 10*3/uL (ref 4.0–10.3)

## 2018-01-02 LAB — COMPREHENSIVE METABOLIC PANEL
ALBUMIN: 4.1 g/dL (ref 3.5–5.0)
ALT: 77 U/L — ABNORMAL HIGH (ref 0–55)
ANION GAP: 9 (ref 3–11)
AST: 50 U/L — AB (ref 5–34)
Alkaline Phosphatase: 73 U/L (ref 40–150)
BUN: 21 mg/dL (ref 7–26)
CO2: 31 mmol/L — AB (ref 22–29)
Calcium: 9.7 mg/dL (ref 8.4–10.4)
Chloride: 103 mmol/L (ref 98–109)
Creatinine, Ser: 1.07 mg/dL (ref 0.70–1.30)
GFR calc Af Amer: >60 mL/min (ref >60)
GFR calc non Af Amer: >60 mL/min (ref >60)
GLUCOSE: 88 mg/dL (ref 70–140)
POTASSIUM: 4.4 mmol/L (ref 3.5–5.1)
SODIUM: 143 mmol/L (ref 136–145)
TOTAL PROTEIN: 7.5 g/dL (ref 6.4–8.3)
Total Bilirubin: 0.9 mg/dL (ref 0.2–1.2)

## 2018-01-02 LAB — RETICULOCYTES
RBC.: 4.89 MIL/uL (ref 4.20–5.82)
RETIC COUNT ABSOLUTE: 92.9 10*3/uL (ref 34.8–93.9)
Retic Ct Pct: 1.9 % — ABNORMAL HIGH (ref 0.8–1.8)

## 2018-01-02 LAB — LACTATE DEHYDROGENASE: LDH: 291 U/L — ABNORMAL HIGH (ref 125–245)

## 2018-01-02 NOTE — Patient Instructions (Signed)
Thank you for choosing [facility] to provide your oncology and hematology care.  To afford each patient quality time with our providers, please arrive 30 minutes before your scheduled appointment time.  If you arrive late for your appointment, you may be asked to reschedule.  We strive to give you quality time with our providers, and arriving late affects you and other patients whose appointments are after yours.   If you are a no show for multiple scheduled visits, you may be dismissed from the clinic at the providers discretion.    Again, thank you for choosing [facility], our hope is that these requests will decrease the amount of time that you wait before being seen by our physicians.  ______________________________________________________________________  Should you have questions after your visit to the [facility], please contact our office at [phone] between the hours of 8:30 and 4:30 p.m.    Voicemails left after 4:30p.m will not be returned until the following business day.    For prescription refill requests, please have your pharmacy contact us directly.  Please also try to allow 48 hours for prescription requests.    Please contact the scheduling department for questions regarding scheduling.  For scheduling of procedures such as PET scans, CT scans, MRI, Ultrasound, etc please contact central scheduling at [phone].    Resources For Cancer Patients and Caregivers:   Oncolink.org:  A wonderful resource for patients and healthcare providers for information regarding your disease, ways to tract your treatment, what to expect, etc.     American Cancer Society:  800-227-2345  Can help patients locate various types of support and financial assistance  Cancer Care: 1-800-813-HOPE (4673) Provides financial assistance, online support groups, medication/co-pay assistance.    [place] DSS:  336-641-3447 Where to apply for food  stamps, Medicaid, and utility assistance  Medicare Rights Center: 800-333-4114 Helps people with Medicare understand their rights and benefits, navigate the Medicare system, and secure the quality healthcare they deserve  SCAT: 336-333-6589 [place] [employer]'s shared-ride transportation service for eligible riders who have a disability that prevents them from riding the fixed route bus.    For additional information on assistance programs please contact our social worker:   Grier Hock/Abigail Elmore:  [phone]            

## 2018-01-03 DIAGNOSIS — G894 Chronic pain syndrome: Secondary | ICD-10-CM | POA: Diagnosis not present

## 2018-01-03 DIAGNOSIS — M62830 Muscle spasm of back: Secondary | ICD-10-CM | POA: Diagnosis not present

## 2018-01-03 DIAGNOSIS — M47817 Spondylosis without myelopathy or radiculopathy, lumbosacral region: Secondary | ICD-10-CM | POA: Diagnosis not present

## 2018-01-03 DIAGNOSIS — M150 Primary generalized (osteo)arthritis: Secondary | ICD-10-CM | POA: Diagnosis not present

## 2018-01-31 DIAGNOSIS — D126 Benign neoplasm of colon, unspecified: Secondary | ICD-10-CM | POA: Diagnosis not present

## 2018-01-31 DIAGNOSIS — Z86010 Personal history of colonic polyps: Secondary | ICD-10-CM | POA: Diagnosis not present

## 2018-02-03 DIAGNOSIS — D126 Benign neoplasm of colon, unspecified: Secondary | ICD-10-CM | POA: Diagnosis not present

## 2018-02-14 DIAGNOSIS — I10 Essential (primary) hypertension: Secondary | ICD-10-CM | POA: Diagnosis not present

## 2018-02-14 DIAGNOSIS — E7800 Pure hypercholesterolemia, unspecified: Secondary | ICD-10-CM | POA: Diagnosis not present

## 2018-02-23 NOTE — Progress Notes (Signed)
[name]  HEMATOLOGY ONCOLOGY PROGRESS NOTE  Date of service: 02/24/18    Patient Care Team: [name], MD as PCP - General (Internal Medicine)  CC F/u for mx of essential thrombocythemia  Diagnosis:  JAK2 Essential thrombocytosis   Current Treatment: Hydroxyurea 1000 mg mon through Friday and 500mg  po on sat and sun  INTERVAL HISTORY:  Mr. Junkin is here to follow up on his Jak2 positive essential thrombocytosis. The patient's last visit with [place] was on 01/02/18. The pt reports that he is doing well overall.   The pt reports no new concerns taking his medication and reports no new concerns since his last visit.    Lab results today (02/24/18) of CBC, and Reticulocytes is as follows: all values are WNL except for MCV at 102.7, MCH at 34.9.  On review of systems, pt reports good energy levels, and denies leg swelling, chest pain, fevers, chills, leg pain, SOB, signs or concerns of infection, abdominal pains, and any other symptoms.   REVIEW OF SYSTEMS:   .10 Point review of Systems was done is negative except as noted above.   Past Medical History:  Diagnosis Date  . 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 Essential thrombocytosis ([place]) 09/19/2015   Jak2 V617F mutation positive Essential thrombocytosis -On Hydroxyurea.   . Hypertension     Past Surgical History:  Procedure Laterality Date  . CARDIAC CATHETERIZATION     16 years ago negative   . HERNIA REPAIR     right inguinal hernia surgery   . I&D KNEE WITH POLY EXCHANGE  12/06/2011   Procedure: IRRIGATION AND DEBRIDEMENT KNEE WITH POLY EXCHANGE;  Surgeon: Mauri Pole, MD;  Location: [facility];  Service: Orthopedics;  Laterality: Right;  Right Total Knee Irrigation and Debridement with Poly Exchange  . JOINT REPLACEMENT     left knee 5/12, right TKA 10/12     Social History   Tobacco Use  . Smoking status: Former Smoker    Packs/day: 0.25    Types: Cigarettes    Last attempt to quit: 11/16/1963    Years  since quitting: 54.3  . Smokeless tobacco: Never Used  Substance Use Topics  . Alcohol use: Yes    Alcohol/week: 1.2 oz    Types: 2 Cans of beer per week  . Drug use: No    ALLERGIES:  is allergic to niacin and related.  MEDICATIONS:  Current Outpatient Medications  Medication Sig Dispense Refill  . aspirin 325 MG tablet Take 325 mg by mouth daily.     [name] atorvastatin (LIPITOR) 40 MG tablet Take 40 mg by mouth every morning.    . baclofen (LIORESAL) 10 MG tablet TAKE 1/2 TABLET BY MOUTH QID AS NEEDED  0  . chlorthalidone (HYGROTON) 25 MG tablet TK 1 T PO EVERY MORNING  3  . Cholecalciferol (VITAMIN D-3) 1000 UNITS CAPS Take 1,000 Units by mouth daily.    . hydroxyurea (HYDREA) 500 MG capsule TAKE 2 CAPSULES BY MOUTH Mon through Thursday THEN TAKE 1 CAPSULE BY MOUTH ON OTHER DAYS. TAKE WITH FOOD (Patient taking differently: TAKE 2 CAPSULES BY MOUTH Mon through Friday THEN TAKE 1 CAPSULE BY MOUTH ON OTHER DAYS. TAKE WITH FOOD) 90 capsule 3  . Multiple Vitamin (MULITIVITAMIN WITH MINERALS) TABS Take 2 tablets by mouth daily.     . Omega-3 Fatty Acids (SEA-OMEGA 30 PO) Take 1 capsule by mouth daily.    [name] oxyCODONE (OXY IR/ROXICODONE) 5 MG immediate  release tablet TK 1.5 TS PO Q 6 H PRN  0   No current facility-administered medications for this visit.     PHYSICAL EXAMINATION: ECOG PERFORMANCE STATUS: 1 - Symptomatic but completely ambulatory  .[name] Vitals:   02/24/18 0826  BP: (!) 147/88  Pulse: 64  Resp: 17  Temp: 98 F (36.7 C)  SpO2: 95%    Filed Weights   02/24/18 0826  Weight: 235 lb 6.4 oz (106.8 kg)   .Body mass index is 33.78 kg/m. [name] GENERAL:alert, in no acute distress and comfortable SKIN: no acute rashes, no significant lesions EYES: conjunctiva are pink and non-injected, sclera anicteric OROPHARYNX: MMM, no exudates, no oropharyngeal erythema or ulceration NECK: supple, no JVD LYMPH:  no palpable lymphadenopathy in the cervical, axillary or inguinal  regions LUNGS: clear to auscultation b/l with normal respiratory effort HEART: regular rate & rhythm ABDOMEN:  normoactive bowel sounds , non tender, not distended. Extremity: no pedal edema PSYCH: alert & oriented x 3 with fluent speech NEURO: no focal motor/sensory deficits  LABORATORY DATA:   I have reviewed the data as listed  . CBC Latest Ref Rng & Units 02/24/2018 01/02/2018 10/31/2017  WBC 4.0 - 10.3 K/uL 7.0 7.7 8.5  Hemoglobin 13.0 - 17.1 g/dL - - 17.3(H)  Hematocrit 38.4 - 49.9 % 48.9 49.8 49.9  Platelets 140 - 400 K/uL 395 404(H) 418(H)    . CMP Latest Ref Rng & Units 02/24/2018 01/02/2018 10/31/2017  Glucose 70 - 140 mg/dL 104 88 94  BUN 7 - 26 mg/dL 24 21 20.7  Creatinine 0.70 - 1.30 mg/dL 1.18 1.07 1.1  Sodium 136 - 145 mmol/L 141 143 141  Potassium 3.5 - 5.1 mmol/L 4.2 4.4 4.6  Chloride 98 - 109 mmol/L 103 103 -  CO2 22 - 29 mmol/L 28 31(H) 29  Calcium 8.4 - 10.4 mg/dL 9.2 9.7 9.4  Total Protein 6.4 - 8.3 g/dL 7.6 7.5 7.4  Total Bilirubin 0.2 - 1.2 mg/dL 0.6 0.9 0.85  Alkaline Phos 40 - 150 U/L 101 73 81  AST 5 - 34 U/L 48(H) 50(H) 54(H)  ALT 0 - 55 U/L 77(H) 77(H) 78(H)    RADIOGRAPHIC STUDIES: I have personally reviewed the radiological images as listed and agreed with the findings in the report.  [place] abd 03/15/2017: IMPRESSION: 1. Liver is echogenic consistent with fatty infiltration and/or hepatocellular disease. No focal hepatic abnormality. No gallstones or biliary distention.  2. Spleen is enlarged at 14.4 cm with a volume of 620 cc.  Electronically Signed   By: Marcello Moores  Register   On: 03/15/2017 09:15   ASSESSMENT & PLAN: '  72 y.o.  Caucasian male with   #1  Jak2 positive Essential Thrombocytosis.   Plan  -Advised active sunscreen protection as the weather begins to warm due to the potential harmful effects with his Hydroxyurea.  -Discussed pt labwork today 02/24/18; Platelets are WNL at 395k and are stable  -Suggest using compression  socks when the pt travels long distances to reduce the risk of blood clots.   -- pt to continue to take 1000mg  Hydroxyurea Mo-Fr, and 500mg  Hydroxyurea Sat-Sun (slight increase from1000mg  4 days a week and 500mg  on fri/sat/sun)  -No prohibitive toxicities from Hydroxyurea at this time.  -Maintain good PO hydration.   #2 Abnormal LFTs -Liver functions unchanged but AST and ALT still a little elevated, most likely related to his fatty liver. -will continue to monitor.   RTC with Dr Irene Limbo in 3 months with labs   .  The total time spent in the appointment was 15 minutes and more than 50% was on counseling and direct patient cares.    Sullivan Lone MD [place] AAHIVMS [facility] Hematology/Oncology Physician [place]  (Office):       [phone] (Work cell):  [phone] (Fax):           [phone]  This document serves as a record of services personally performed by Sullivan Lone, MD. It was created on his behalf by Baldwin Jamaica, a trained medical scribe. The creation of this record is based on the scribe's personal observations and the provider's statements to them.   .I have reviewed the above documentation for accuracy and completeness, and I agree with the above. Brunetta Genera MD MS

## 2018-02-24 ENCOUNTER — Telehealth: Payer: Self-pay | Admitting: Hematology

## 2018-02-24 ENCOUNTER — Telehealth: Payer: Self-pay

## 2018-02-24 ENCOUNTER — Inpatient Hospital Stay (HOSPITAL_BASED_OUTPATIENT_CLINIC_OR_DEPARTMENT_OTHER): Payer: Medicare Other | Admitting: Hematology

## 2018-02-24 ENCOUNTER — Inpatient Hospital Stay: Payer: Medicare Other | Attending: Hematology

## 2018-02-24 ENCOUNTER — Encounter: Payer: Self-pay | Admitting: Hematology

## 2018-02-24 VITALS — BP 147/88 | HR 64 | Temp 98.0°F | Resp 17 | Ht 70.0 in | Wt 235.4 lb

## 2018-02-24 DIAGNOSIS — R7989 Other specified abnormal findings of blood chemistry: Secondary | ICD-10-CM | POA: Diagnosis not present

## 2018-02-24 DIAGNOSIS — Z87891 Personal history of nicotine dependence: Secondary | ICD-10-CM | POA: Insufficient documentation

## 2018-02-24 DIAGNOSIS — D473 Essential (hemorrhagic) thrombocythemia: Secondary | ICD-10-CM

## 2018-02-24 DIAGNOSIS — Z79899 Other long term (current) drug therapy: Secondary | ICD-10-CM | POA: Diagnosis not present

## 2018-02-24 DIAGNOSIS — Z7982 Long term (current) use of aspirin: Secondary | ICD-10-CM | POA: Diagnosis not present

## 2018-02-24 LAB — COMPREHENSIVE METABOLIC PANEL WITH GFR
ALT: 77 U/L — ABNORMAL HIGH (ref 0–55)
AST: 48 U/L — ABNORMAL HIGH (ref 5–34)
Albumin: 4.1 g/dL (ref 3.5–5.0)
Alkaline Phosphatase: 101 U/L (ref 40–150)
Anion gap: 10 (ref 3–11)
BUN: 24 mg/dL (ref 7–26)
CO2: 28 mmol/L (ref 22–29)
Calcium: 9.2 mg/dL (ref 8.4–10.4)
Chloride: 103 mmol/L (ref 98–109)
Creatinine, Ser: 1.18 mg/dL (ref 0.70–1.30)
GFR calc Af Amer: >60 mL/min
GFR calc non Af Amer: 60 mL/min — ABNORMAL LOW
Glucose, Bld: 104 mg/dL (ref 70–140)
Potassium: 4.2 mmol/L (ref 3.5–5.1)
Sodium: 141 mmol/L (ref 136–145)
Total Bilirubin: 0.6 mg/dL (ref 0.2–1.2)
Total Protein: 7.6 g/dL (ref 6.4–8.3)

## 2018-02-24 LAB — CBC WITH DIFFERENTIAL (CANCER CENTER ONLY)
BASOS PCT: 1 %
Basophils Absolute: 0 10*3/uL (ref 0.0–0.1)
Eosinophils Absolute: 0.2 10*3/uL (ref 0.0–0.5)
Eosinophils Relative: 3 %
HEMATOCRIT: 48.9 % (ref 38.4–49.9)
Hemoglobin: 16.6 g/dL (ref 13.0–17.1)
LYMPHS ABS: 0.9 10*3/uL (ref 0.9–3.3)
Lymphocytes Relative: 13 %
MCH: 34.9 pg — AB (ref 27.2–33.4)
MCHC: 33.9 g/dL (ref 32.0–36.0)
MCV: 102.7 fL — AB (ref 79.3–98.0)
MONO ABS: 0.9 10*3/uL (ref 0.1–0.9)
MONOS PCT: 12 %
NEUTROS ABS: 5 10*3/uL (ref 1.5–6.5)
Neutrophils Relative %: 71 %
Platelet Count: 395 10*3/uL (ref 140–400)
RBC: 4.76 MIL/uL (ref 4.20–5.82)
RDW: 14.3 % (ref 11.0–14.6)
WBC Count: 7 10*3/uL (ref 4.0–10.3)

## 2018-02-24 LAB — RETICULOCYTES
RBC.: 4.76 MIL/uL (ref 4.20–5.82)
Retic Count, Absolute: 66.6 10*3/uL (ref 34.8–93.9)
Retic Ct Pct: 1.4 % (ref 0.8–1.8)

## 2018-02-24 LAB — LACTATE DEHYDROGENASE: LDH: 256 U/L — ABNORMAL HIGH (ref 125–245)

## 2018-02-24 NOTE — Patient Instructions (Signed)
Thank you for choosing [facility] to provide your oncology and hematology care.  To afford each patient quality time with our providers, please arrive 30 minutes before your scheduled appointment time.  If you arrive late for your appointment, you may be asked to reschedule.  We strive to give you quality time with our providers, and arriving late affects you and other patients whose appointments are after yours.   If you are a no show for multiple scheduled visits, you may be dismissed from the clinic at the providers discretion.    Again, thank you for choosing [facility], our hope is that these requests will decrease the amount of time that you wait before being seen by our physicians.  ______________________________________________________________________  Should you have questions after your visit to the [facility], please contact our office at [phone] between the hours of 8:30 and 4:30 p.m.    Voicemails left after 4:30p.m will not be returned until the following business day.    For prescription refill requests, please have your pharmacy contact us directly.  Please also try to allow 48 hours for prescription requests.    Please contact the scheduling department for questions regarding scheduling.  For scheduling of procedures such as PET scans, CT scans, MRI, Ultrasound, etc please contact central scheduling at [phone].    Resources For Cancer Patients and Caregivers:   Oncolink.org:  A wonderful resource for patients and healthcare providers for information regarding your disease, ways to tract your treatment, what to expect, etc.     American Cancer Society:  800-227-2345  Can help patients locate various types of support and financial assistance  Cancer Care: 1-800-813-HOPE (4673) Provides financial assistance, online support groups, medication/co-pay assistance.    [place] DSS:  336-641-3447 Where to apply for food  stamps, Medicaid, and utility assistance  Medicare Rights Center: 800-333-4114 Helps people with Medicare understand their rights and benefits, navigate the Medicare system, and secure the quality healthcare they deserve  SCAT: 336-333-6589 [place] [employer]'s shared-ride transportation service for eligible riders who have a disability that prevents them from riding the fixed route bus.    For additional information on assistance programs please contact our social worker:   Grier Hock/Abigail Elmore:  [phone]            

## 2018-02-24 NOTE — Telephone Encounter (Signed)
Scheduled appt per 4/12 los - Gave patient AVS and calender per los.  

## 2018-02-24 NOTE — Telephone Encounter (Signed)
Changed patient appointment. Per 4/12 in basket request

## 2018-02-27 ENCOUNTER — Ambulatory Visit: Payer: Medicare Other | Admitting: Hematology

## 2018-02-27 ENCOUNTER — Other Ambulatory Visit: Payer: Medicare Other

## 2018-02-28 DIAGNOSIS — G894 Chronic pain syndrome: Secondary | ICD-10-CM | POA: Diagnosis not present

## 2018-02-28 DIAGNOSIS — M62830 Muscle spasm of back: Secondary | ICD-10-CM | POA: Diagnosis not present

## 2018-02-28 DIAGNOSIS — M47817 Spondylosis without myelopathy or radiculopathy, lumbosacral region: Secondary | ICD-10-CM | POA: Diagnosis not present

## 2018-02-28 DIAGNOSIS — M150 Primary generalized (osteo)arthritis: Secondary | ICD-10-CM | POA: Diagnosis not present

## 2018-04-25 DIAGNOSIS — M150 Primary generalized (osteo)arthritis: Secondary | ICD-10-CM | POA: Diagnosis not present

## 2018-04-25 DIAGNOSIS — M47817 Spondylosis without myelopathy or radiculopathy, lumbosacral region: Secondary | ICD-10-CM | POA: Diagnosis not present

## 2018-04-25 DIAGNOSIS — M62830 Muscle spasm of back: Secondary | ICD-10-CM | POA: Diagnosis not present

## 2018-04-25 DIAGNOSIS — G894 Chronic pain syndrome: Secondary | ICD-10-CM | POA: Diagnosis not present

## 2018-05-26 NOTE — Progress Notes (Signed)
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 Marcussen is here to follow up on his Jak2 positive essential thrombocytosis. The patient's last visit with [place] was on 02/24/18. The pt reports that he is doing well overall.   The pt reports that his summer is going well and he has no new symptoms or concerns. He denies noticing any new lumps or bumps, and notes that he has been checking regularly for this.   The pt will be seeing his PCP Dr. Laurann Montana in October.   Lab results today (05/29/18) of CBC w/diff, CMP, and Reticulocytes is as follows: all values are WNL except for MCV at 102.4, MCH at 34.8, PLT at 406k, Monocytes abs at 1.2k, AST at 42, ALT at 66. LDH 05/29/18 is at 259  On review of systems, pt reports stable energy levels and denies mouth sores, new bone pains, bleeding, bruising, recent infections, abdominal pains, and any other symptoms.    REVIEW OF SYSTEMS:   A 10+ POINT REVIEW OF SYSTEMS WAS OBTAINED including neurology, dermatology, psychiatry, cardiac, respiratory, lymph, extremities, GI, GU, Musculoskeletal, constitutional, breasts, reproductive, HEENT.  All pertinent positives are noted in the HPI.  All others are negative.    Past Medical History:  Diagnosis Date  . 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   . Hypertension     Past Surgical History:  Procedure Laterality Date  . CARDIAC CATHETERIZATION     16 years ago negative   . HERNIA REPAIR     right inguinal hernia surgery   . I&D KNEE WITH POLY EXCHANGE  12/06/2011   Procedure:  IRRIGATION AND DEBRIDEMENT KNEE WITH POLY EXCHANGE;  Surgeon: Mauri Pole, MD;  Location: [facility];  Service: Orthopedics;  Laterality: Right;  Right Total Knee Irrigation and Debridement with Poly Exchange  . JOINT REPLACEMENT     left knee 5/12, right TKA 10/12     Social History   Tobacco Use  . Smoking status: Former Smoker    Packs/day: 0.25    Types: Cigarettes    Last attempt to quit: 11/16/1963    Years since quitting: 54.5  . Smokeless tobacco: Never Used  Substance Use Topics  . Alcohol use: Yes    Alcohol/week: 1.2 oz    Types: 2 Cans of beer per week  . Drug use: No    ALLERGIES:  is allergic to niacin and related.  MEDICATIONS:  Current Outpatient Medications  Medication Sig Dispense Refill  . aspirin 325 MG tablet Take 325 mg by mouth daily.     Jordan Vazquez atorvastatin (LIPITOR) 40 MG tablet Take 40 mg by mouth every morning.    . baclofen (LIORESAL) 10 MG tablet TAKE 1/2 TABLET BY MOUTH QID AS NEEDED  0  . chlorthalidone (HYGROTON) 25 MG tablet TK 1 T PO EVERY MORNING  3  . Cholecalciferol (VITAMIN D-3) 1000 UNITS CAPS Take 1,000 Units by mouth daily.    . hydroxyurea (HYDREA) 500 MG capsule TAKE 2 CAPSULES BY MOUTH Mon through Thursday THEN TAKE 1 CAPSULE BY MOUTH ON  OTHER DAYS. TAKE WITH FOOD (Patient taking differently: TAKE 2 CAPSULES BY MOUTH Mon through Friday THEN TAKE 1 CAPSULE BY MOUTH ON OTHER DAYS. TAKE WITH FOOD) 90 capsule 3  . Multiple Vitamin (MULITIVITAMIN WITH MINERALS) TABS Take 2 tablets by mouth daily.     . Omega-3 Fatty Acids (SEA-OMEGA 30 PO) Take 1 capsule by mouth daily.    Jordan Vazquez oxyCODONE (OXY IR/ROXICODONE) 5 MG immediate release tablet TK 1.5 TS PO Q 6 H PRN  0   No current facility-administered medications for this visit.     PHYSICAL EXAMINATION: ECOG PERFORMANCE STATUS: 1 - Symptomatic but completely ambulatory  Vitals:   05/29/18 0849  BP: (!) 142/79  Pulse: 64  Resp: 18  Temp: 98 F (36.7 C)  SpO2: 97%    Filed Weights   05/29/18  0849  Weight: 234 lb 0.3 oz (106.2 kg)   .Body mass index is 33.58 kg/m.  GENERAL:alert, in no acute distress and comfortable SKIN: no acute rashes, no significant lesions EYES: conjunctiva are pink and non-injected, sclera anicteric OROPHARYNX: MMM, no exudates, no oropharyngeal erythema or ulceration NECK: supple, no JVD LYMPH:  no palpable lymphadenopathy in the cervical, axillary or inguinal regions LUNGS: clear to auscultation b/l with normal respiratory effort HEART: regular rate & rhythm ABDOMEN:  normoactive bowel sounds , non tender, not distended. No palpable hepatosplenomegaly.  Extremity: no pedal edema PSYCH: alert & oriented x 3 with fluent speech NEURO: no focal motor/sensory deficits   LABORATORY DATA:   I have reviewed the data as listed  . CBC Latest Ref Rng & Units 05/29/2018 02/24/2018 01/02/2018  WBC 4.0 - 10.3 K/uL 8.2 7.0 7.7  Hemoglobin 13.0 - 17.1 g/dL 16.3 16.6 16.9  Hematocrit 38.4 - 49.9 % 47.9 48.9 49.8  Platelets 140 - 400 K/uL 406(H) 395 404(H)    . CMP Latest Ref Rng & Units 05/29/2018 02/24/2018 01/02/2018  Glucose 70 - 99 mg/dL 92 104 88  BUN 8 - 23 mg/dL 22 24 21   Creatinine 0.61 - 1.24 mg/dL 1.13 1.18 1.07  Sodium 135 - 145 mmol/L 141 141 143  Potassium 3.5 - 5.1 mmol/L 4.3 4.2 4.4  Chloride 98 - 111 mmol/L 101 103 103  CO2 22 - 32 mmol/L 30 28 31(H)  Calcium 8.9 - 10.3 mg/dL 9.5 9.2 9.7  Total Protein 6.5 - 8.1 g/dL 7.5 7.6 7.5  Total Bilirubin 0.3 - 1.2 mg/dL 0.7 0.6 0.9  Alkaline Phos 38 - 126 U/L 84 101 73  AST 15 - 41 U/L 42(H) 48(H) 50(H)  ALT 0 - 44 U/L 66(H) 77(H) 77(H)    RADIOGRAPHIC STUDIES: I have personally reviewed the radiological images as listed and agreed with the findings in the report.  [place] abd 03/15/2017: IMPRESSION: 1. Liver is echogenic consistent with fatty infiltration and/or hepatocellular disease. No focal hepatic abnormality. No gallstones or biliary distention.  2. Spleen is enlarged at 14.4 cm with a  volume of 620 cc.  Electronically Signed   By: Marcello Moores  Register   On: 03/15/2017 09:15   ASSESSMENT & PLAN: '  72 y.o.  Caucasian male with   #1  Jak2 positive Essential Thrombocytosis.   Plan: -Advised active sunscreen protection as the weather begins to warm due to the potential harmful effects with his Hydroxyurea.  -Suggest using compression socks when the pt travels long distances to reduce the risk of blood clots. -Maintain good PO hydration.  -Discussed pt labwork today, 05/29/18; PLT at 406k, HGB normal at 16.3,  liver functions slightly improved with AST to 42 and ALT to 66 -Increase 1000mg  Hydroxyurea use from 5 days each week to 7 days a week until next visit -The pt has no prohibitive toxicities from continuing Hydroxyurea at this time. -Will see pt back in 3 months   #2 Abnormal LFTs -Liver functions unchanged but AST and ALT still a little elevated, most likely related to his fatty liver. -will continue to monitor.   RTC with Dr Irene Limbo with labs in 3 months (Patient prefers 8:40 AM slot)    The total time spent in the appt was 20 minutes and more than 50% was on counseling and direct patient cares.    Sullivan Lone MD [place] AAHIVMS [facility] Hematology/Oncology Physician [facility]  (Office):       [phone] (Work cell):  [phone] (Fax):           [phone]  I, Baldwin Jamaica, am acting as a scribe for Dr Irene Limbo.   .I have reviewed the above documentation for accuracy and completeness, and I agree with the above. Brunetta Genera MD

## 2018-05-29 ENCOUNTER — Inpatient Hospital Stay: Payer: Medicare Other | Attending: Hematology

## 2018-05-29 ENCOUNTER — Ambulatory Visit: Payer: Medicare Other | Admitting: Hematology

## 2018-05-29 ENCOUNTER — Other Ambulatory Visit: Payer: Medicare Other

## 2018-05-29 ENCOUNTER — Telehealth: Payer: Self-pay | Admitting: Hematology

## 2018-05-29 ENCOUNTER — Inpatient Hospital Stay (HOSPITAL_BASED_OUTPATIENT_CLINIC_OR_DEPARTMENT_OTHER): Payer: Medicare Other | Admitting: Hematology

## 2018-05-29 VITALS — BP 142/79 | HR 64 | Temp 98.0°F | Resp 18 | Wt 234.0 lb

## 2018-05-29 DIAGNOSIS — D473 Essential (hemorrhagic) thrombocythemia: Secondary | ICD-10-CM

## 2018-05-29 DIAGNOSIS — Z79899 Other long term (current) drug therapy: Secondary | ICD-10-CM | POA: Diagnosis not present

## 2018-05-29 DIAGNOSIS — Z87891 Personal history of nicotine dependence: Secondary | ICD-10-CM | POA: Insufficient documentation

## 2018-05-29 DIAGNOSIS — R7989 Other specified abnormal findings of blood chemistry: Secondary | ICD-10-CM | POA: Insufficient documentation

## 2018-05-29 DIAGNOSIS — Z7982 Long term (current) use of aspirin: Secondary | ICD-10-CM

## 2018-05-29 DIAGNOSIS — I10 Essential (primary) hypertension: Secondary | ICD-10-CM

## 2018-05-29 LAB — CBC WITH DIFFERENTIAL (CANCER CENTER ONLY)
BASOS PCT: 1 %
Basophils Absolute: 0.1 10*3/uL (ref 0.0–0.1)
EOS ABS: 0.2 10*3/uL (ref 0.0–0.5)
Eosinophils Relative: 3 %
HCT: 47.9 % (ref 38.4–49.9)
HEMOGLOBIN: 16.3 g/dL (ref 13.0–17.1)
Lymphocytes Relative: 15 %
Lymphs Abs: 1.3 10*3/uL (ref 0.9–3.3)
MCH: 34.8 pg — ABNORMAL HIGH (ref 27.2–33.4)
MCHC: 34 g/dL (ref 32.0–36.0)
MCV: 102.4 fL — ABNORMAL HIGH (ref 79.3–98.0)
Monocytes Absolute: 1.2 10*3/uL — ABNORMAL HIGH (ref 0.1–0.9)
Monocytes Relative: 15 %
NEUTROS PCT: 66 %
Neutro Abs: 5.5 10*3/uL (ref 1.5–6.5)
Platelet Count: 406 10*3/uL — ABNORMAL HIGH (ref 140–400)
RBC: 4.68 MIL/uL (ref 4.20–5.82)
RDW: 14.3 % (ref 11.0–14.6)
WBC: 8.2 10*3/uL (ref 4.0–10.3)

## 2018-05-29 LAB — CMP (CANCER CENTER ONLY)
ALK PHOS: 84 U/L (ref 38–126)
ALT: 66 U/L — ABNORMAL HIGH (ref 0–44)
ANION GAP: 10 (ref 5–15)
AST: 42 U/L — ABNORMAL HIGH (ref 15–41)
Albumin: 4.2 g/dL (ref 3.5–5.0)
BUN: 22 mg/dL (ref 8–23)
CALCIUM: 9.5 mg/dL (ref 8.9–10.3)
CO2: 30 mmol/L (ref 22–32)
Chloride: 101 mmol/L (ref 98–111)
Creatinine: 1.13 mg/dL (ref 0.61–1.24)
GFR, Estimated: >60 mL/min (ref >60)
Glucose, Bld: 92 mg/dL (ref 70–99)
POTASSIUM: 4.3 mmol/L (ref 3.5–5.1)
SODIUM: 141 mmol/L (ref 135–145)
TOTAL PROTEIN: 7.5 g/dL (ref 6.5–8.1)
Total Bilirubin: 0.7 mg/dL (ref 0.3–1.2)

## 2018-05-29 LAB — RETICULOCYTES
RBC.: 4.68 MIL/uL (ref 4.20–5.82)
RETIC COUNT ABSOLUTE: 74.9 10*3/uL (ref 34.8–93.9)
RETIC CT PCT: 1.6 % (ref 0.8–1.8)

## 2018-05-29 LAB — LACTATE DEHYDROGENASE: LDH: 259 U/L — ABNORMAL HIGH (ref 98–192)

## 2018-05-29 MED ORDER — HYDROXYUREA 500 MG PO CAPS: 1000.0000 mg | ORAL_CAPSULE | Freq: Every day | ORAL | 3 refills | Status: DC

## 2018-05-29 NOTE — Telephone Encounter (Signed)
Gave patient avs and calendar of upcoming oct appts.  °

## 2018-06-27 DIAGNOSIS — G894 Chronic pain syndrome: Secondary | ICD-10-CM | POA: Diagnosis not present

## 2018-06-27 DIAGNOSIS — M150 Primary generalized (osteo)arthritis: Secondary | ICD-10-CM | POA: Diagnosis not present

## 2018-06-27 DIAGNOSIS — M47817 Spondylosis without myelopathy or radiculopathy, lumbosacral region: Secondary | ICD-10-CM | POA: Diagnosis not present

## 2018-06-27 DIAGNOSIS — M62830 Muscle spasm of back: Secondary | ICD-10-CM | POA: Diagnosis not present

## 2018-07-23 ENCOUNTER — Other Ambulatory Visit: Payer: Self-pay | Admitting: Hematology

## 2018-08-15 DIAGNOSIS — L723 Sebaceous cyst: Secondary | ICD-10-CM | POA: Diagnosis not present

## 2018-08-15 DIAGNOSIS — H04123 Dry eye syndrome of bilateral lacrimal glands: Secondary | ICD-10-CM | POA: Diagnosis not present

## 2018-08-28 DIAGNOSIS — G894 Chronic pain syndrome: Secondary | ICD-10-CM | POA: Diagnosis not present

## 2018-08-28 DIAGNOSIS — M62830 Muscle spasm of back: Secondary | ICD-10-CM | POA: Diagnosis not present

## 2018-08-28 DIAGNOSIS — M150 Primary generalized (osteo)arthritis: Secondary | ICD-10-CM | POA: Diagnosis not present

## 2018-08-28 DIAGNOSIS — M47817 Spondylosis without myelopathy or radiculopathy, lumbosacral region: Secondary | ICD-10-CM | POA: Diagnosis not present

## 2018-08-29 DIAGNOSIS — D473 Essential (hemorrhagic) thrombocythemia: Secondary | ICD-10-CM | POA: Diagnosis not present

## 2018-08-29 DIAGNOSIS — I10 Essential (primary) hypertension: Secondary | ICD-10-CM | POA: Diagnosis not present

## 2018-08-29 DIAGNOSIS — R945 Abnormal results of liver function studies: Secondary | ICD-10-CM | POA: Diagnosis not present

## 2018-08-29 DIAGNOSIS — E7800 Pure hypercholesterolemia, unspecified: Secondary | ICD-10-CM | POA: Diagnosis not present

## 2018-09-04 ENCOUNTER — Inpatient Hospital Stay: Payer: Medicare Other | Admitting: Hematology

## 2018-09-04 ENCOUNTER — Telehealth: Payer: Self-pay

## 2018-09-04 ENCOUNTER — Inpatient Hospital Stay: Payer: Medicare Other

## 2018-09-04 NOTE — Telephone Encounter (Signed)
Called pt regarding missed appt this morning. Pt called to cancel, but no response. Rescheduled for 09/11/18 at 0815 for lab work and [place] with Dr. Irene Limbo. Pt performed readback of appts.

## 2018-09-11 ENCOUNTER — Telehealth: Payer: Self-pay | Admitting: Hematology

## 2018-09-11 ENCOUNTER — Inpatient Hospital Stay: Payer: Medicare Other | Attending: Hematology | Admitting: Hematology

## 2018-09-11 ENCOUNTER — Inpatient Hospital Stay: Payer: Medicare Other

## 2018-09-11 ENCOUNTER — Encounter: Payer: Self-pay | Admitting: Hematology

## 2018-09-11 VITALS — BP 144/72 | HR 64 | Temp 97.8°F | Resp 16 | Ht 70.0 in | Wt 214.0 lb

## 2018-09-11 DIAGNOSIS — R945 Abnormal results of liver function studies: Secondary | ICD-10-CM | POA: Diagnosis not present

## 2018-09-11 DIAGNOSIS — Z87891 Personal history of nicotine dependence: Secondary | ICD-10-CM | POA: Insufficient documentation

## 2018-09-11 DIAGNOSIS — Z79899 Other long term (current) drug therapy: Secondary | ICD-10-CM | POA: Insufficient documentation

## 2018-09-11 DIAGNOSIS — Z7982 Long term (current) use of aspirin: Secondary | ICD-10-CM | POA: Diagnosis not present

## 2018-09-11 DIAGNOSIS — I10 Essential (primary) hypertension: Secondary | ICD-10-CM | POA: Diagnosis not present

## 2018-09-11 DIAGNOSIS — D473 Essential (hemorrhagic) thrombocythemia: Secondary | ICD-10-CM

## 2018-09-11 LAB — CMP (CANCER CENTER ONLY)
ALT: 48 U/L — ABNORMAL HIGH (ref 0–44)
AST: 40 U/L (ref 15–41)
Albumin: 4.1 g/dL (ref 3.5–5.0)
Alkaline Phosphatase: 68 U/L (ref 38–126)
Anion gap: 11 (ref 5–15)
BUN: 25 mg/dL — ABNORMAL HIGH (ref 8–23)
CHLORIDE: 103 mmol/L (ref 98–111)
CO2: 29 mmol/L (ref 22–32)
CREATININE: 1.18 mg/dL (ref 0.61–1.24)
Calcium: 9.5 mg/dL (ref 8.9–10.3)
GFR, EST NON AFRICAN AMERICAN: 60 mL/min — AB (ref >60)
Glucose, Bld: 91 mg/dL (ref 70–99)
POTASSIUM: 4 mmol/L (ref 3.5–5.1)
Sodium: 143 mmol/L (ref 135–145)
TOTAL PROTEIN: 7.5 g/dL (ref 6.5–8.1)
Total Bilirubin: 1 mg/dL (ref 0.3–1.2)

## 2018-09-11 LAB — CBC WITH DIFFERENTIAL/PLATELET
Abs Immature Granulocytes: 0.04 10*3/uL (ref 0.00–0.07)
BASOS ABS: 0.1 10*3/uL (ref 0.0–0.1)
BASOS PCT: 1 %
EOS ABS: 0.3 10*3/uL (ref 0.0–0.5)
EOS PCT: 3 %
HCT: 47.3 % (ref 39.0–52.0)
Hemoglobin: 16.1 g/dL (ref 13.0–17.0)
Immature Granulocytes: 0 %
Lymphocytes Relative: 12 %
Lymphs Abs: 1.1 10*3/uL (ref 0.7–4.0)
MCH: 35.8 pg — ABNORMAL HIGH (ref 26.0–34.0)
MCHC: 34 g/dL (ref 30.0–36.0)
MCV: 105.1 fL — ABNORMAL HIGH (ref 80.0–100.0)
Monocytes Absolute: 1.1 10*3/uL — ABNORMAL HIGH (ref 0.1–1.0)
Monocytes Relative: 11 %
NRBC: 0 % (ref 0.0–0.2)
Neutro Abs: 6.8 10*3/uL (ref 1.7–7.7)
Neutrophils Relative %: 73 %
PLATELETS: 415 10*3/uL — AB (ref 150–400)
RBC: 4.5 MIL/uL (ref 4.22–5.81)
RDW: 13.8 % (ref 11.5–15.5)
WBC: 9.3 10*3/uL (ref 4.0–10.5)

## 2018-09-11 MED ORDER — HYDROXYUREA 500 MG PO CAPS: 1000.0000 mg | ORAL_CAPSULE | Freq: Every day | ORAL | 3 refills | Status: DC

## 2018-09-11 NOTE — Telephone Encounter (Signed)
Gave pt avs and calendar  °

## 2018-09-11 NOTE — Progress Notes (Signed)
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 is here to follow up on his Jak2 positive essential thrombocytosis. The patient's last visit with [place] was on 05/29/18. The pt reports that he is doing well overall.   The pt reports that he has not developed any new concerns in the interim. He has not developed any constitutional symptoms or concerns for infections. The pt notes that he has not developed any concerns of bleeding or abnormal bruising. He has continued on 325mg  aspirin and 1000mg  Hydroxyurea every day.   The pt notes that he has lost 25 pounds intentionally, and has begun working out every day. He notes that this has benefited his overall energy levels and his heart health. He also notes that his joint pains have alleviated.   Lab results today (09/11/18) of CBC w/diff, CMP is as follows: all values are WNL except for MCV at 105.1, MCH at 35.8, PLT at 415k, Monocytes abs at 1.1k, BUN at 25, ALT at 48, GFR at 60.  On review of systems, pt reports intentional weight loss, staying very active, good energy levels, and denies fevers, chills, night sweats, excessive bleeding, excessive bruising, nose bleeds, gum bleeds, blood in the stools, concerns for infections, mouth sores, pain along the spine, abdominal pains, leg swelling, and any other symptoms.    REVIEW OF SYSTEMS:    A 10+ POINT REVIEW OF SYSTEMS WAS OBTAINED including neurology, dermatology, psychiatry, cardiac, respiratory, lymph, extremities, GI, GU, Musculoskeletal, constitutional, breasts, reproductive, HEENT.  All pertinent positives are noted in the HPI.  All others are negative.    Past Medical History:  Diagnosis  Date  . 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   . Hypertension     Past Surgical History:  Procedure Laterality Date  . CARDIAC CATHETERIZATION     16 years ago negative   . HERNIA REPAIR     right inguinal hernia surgery   . I&D KNEE WITH POLY EXCHANGE  12/06/2011   Procedure: IRRIGATION AND DEBRIDEMENT KNEE WITH POLY EXCHANGE;  Surgeon: Mauri Pole, MD;  Location: [facility];  Service: Orthopedics;  Laterality: Right;  Right Total Knee Irrigation and Debridement with Poly Exchange  . JOINT REPLACEMENT     left knee 5/12, right TKA 10/12     Social History   Tobacco Use  . Smoking status: Former Smoker    Packs/day: 0.25    Types: Cigarettes    Last attempt to quit: 11/16/1963    Years since quitting: 54.8  . Smokeless tobacco: Never Used  Substance Use Topics  . Alcohol use: Yes    Alcohol/week: 2.0 standard drinks    Types: 2 Cans of beer per week  . Drug use: No    ALLERGIES:  is allergic to niacin and related.  MEDICATIONS:  Current Outpatient Medications  Medication Sig Dispense Refill  . aspirin 325 MG tablet Take 325 mg by mouth daily.     Jordan Vazquez atorvastatin (LIPITOR) 40 MG tablet Take 40 mg by mouth every morning.    . baclofen (  LIORESAL) 10 MG tablet TAKE 1/2 TABLET BY MOUTH QID AS NEEDED  0  . chlorthalidone (HYGROTON) 25 MG tablet TK 1 T PO EVERY MORNING  3  . Cholecalciferol (VITAMIN D-3) 1000 UNITS CAPS Take 1,000 Units by mouth daily.    . hydroxyurea (HYDREA) 500 MG capsule Take 2 capsules (1,000 mg total) by mouth daily. TAKE 2 CAPSULES BY MOUTH DAILY. TAKE WITH FOOD 60 capsule 3  . Multiple Vitamin (MULITIVITAMIN WITH MINERALS) TABS Take 2 tablets by mouth daily.     . Omega-3 Fatty Acids (SEA-OMEGA 30 PO) Take 1 capsule by mouth daily.    Jordan Vazquez oxyCODONE (OXY IR/ROXICODONE) 5 MG immediate release tablet TK 1.5 TS PO Q 6 H PRN  0     No current facility-administered medications for this visit.     PHYSICAL EXAMINATION: ECOG PERFORMANCE STATUS: 1 - Symptomatic but completely ambulatory  Vitals:   09/11/18 0839  BP: (!) 144/72  Pulse: 64  Resp: 16  Temp: 97.8 F (36.6 C)  SpO2: 97%    Filed Weights   09/11/18 0839  Weight: 214 lb (97.1 kg)   .Body mass index is 30.71 kg/m.  GENERAL:alert, in no acute distress and comfortable SKIN: no acute rashes, no significant lesions EYES: conjunctiva are pink and non-injected, sclera anicteric OROPHARYNX: MMM, no exudates, no oropharyngeal erythema or ulceration NECK: supple, no JVD LYMPH:  no palpable lymphadenopathy in the cervical, axillary or inguinal regions LUNGS: clear to auscultation b/l with normal respiratory effort HEART: regular rate & rhythm ABDOMEN:  normoactive bowel sounds , non tender, not distended. No palpable hepatosplenomegaly.  Extremity: no pedal edema PSYCH: alert & oriented x 3 with fluent speech NEURO: no focal motor/sensory deficits   LABORATORY DATA:   I have reviewed the data as listed  . CBC Latest Ref Rng & Units 09/11/2018 05/29/2018 02/24/2018  WBC 4.0 - 10.5 K/uL 9.3 8.2 7.0  Hemoglobin 13.0 - 17.0 g/dL 16.1 16.3 16.6  Hematocrit 39.0 - 52.0 % 47.3 47.9 48.9  Platelets 150 - 400 K/uL 415(H) 406(H) 395    . CMP Latest Ref Rng & Units 09/11/2018 05/29/2018 02/24/2018  Glucose 70 - 99 mg/dL 91 92 104  BUN 8 - 23 mg/dL 25(H) 22 24  Creatinine 0.61 - 1.24 mg/dL 1.18 1.13 1.18  Sodium 135 - 145 mmol/L 143 141 141  Potassium 3.5 - 5.1 mmol/L 4.0 4.3 4.2  Chloride 98 - 111 mmol/L 103 101 103  CO2 22 - 32 mmol/L 29 30 28   Calcium 8.9 - 10.3 mg/dL 9.5 9.5 9.2  Total Protein 6.5 - 8.1 g/dL 7.5 7.5 7.6  Total Bilirubin 0.3 - 1.2 mg/dL 1.0 0.7 0.6  Alkaline Phos 38 - 126 U/L 68 84 101  AST 15 - 41 U/L 40 42(H) 48(H)  ALT 0 - 44 U/L 48(H) 66(H) 77(H)    RADIOGRAPHIC STUDIES: I have personally reviewed the radiological images  as listed and agreed with the findings in the report.  [place] abd 03/15/2017: IMPRESSION: 1. Liver is echogenic consistent with fatty infiltration and/or hepatocellular disease. No focal hepatic abnormality. No gallstones or biliary distention.  2. Spleen is enlarged at 14.4 cm with a volume of 620 cc.  Electronically Signed   By: Marcello Moores  Register   On: 03/15/2017 09:15   ASSESSMENT & PLAN: '  72 y.o.  Caucasian male with   #1  Jak2 positive Essential Thrombocytosis.   Plan: -Advised active sunscreen protection as the weather begins to warm due to the  potential harmful effects with his Hydroxyurea.  -Suggest using compression socks when the pt travels long distances to reduce the risk of blood clots. -Maintain good PO hydration.  -Discussed pt labwork today, 09/11/18; PLT just above goal at 415k but this is acceptable for risk vs benefit -No prohibitive toxicities from continuing with 1000mg  Hydroxyurea every day -Continue 325mg  aspirin -Will see the pt back in 3 months, sooner if any new concerns    #2 Abnormal LFTs -Liver functions unchanged but AST and ALT still a little elevated, most likely related to his fatty liver. -will continue to monitor.   RTC with Dr Irene Limbo in 3 months with labs   The total time spent in the appt was 20 minutes and more than 50% was on counseling and direct patient cares.    Sullivan Lone MD MS AAHIVMS [facility] Hematology/Oncology Physician [facility]  (Office):       [phone] (Work cell):  [phone] (Fax):           [phone]   I, Baldwin Jamaica, am acting as a scribe for Dr. Irene Limbo  .I have reviewed the above documentation for accuracy and completeness, and I agree with the above. Brunetta Genera MD

## 2018-10-31 DIAGNOSIS — M62830 Muscle spasm of back: Secondary | ICD-10-CM | POA: Diagnosis not present

## 2018-10-31 DIAGNOSIS — M47817 Spondylosis without myelopathy or radiculopathy, lumbosacral region: Secondary | ICD-10-CM | POA: Diagnosis not present

## 2018-10-31 DIAGNOSIS — G894 Chronic pain syndrome: Secondary | ICD-10-CM | POA: Diagnosis not present

## 2018-10-31 DIAGNOSIS — M150 Primary generalized (osteo)arthritis: Secondary | ICD-10-CM | POA: Diagnosis not present

## 2018-12-08 NOTE — Progress Notes (Signed)
[name]  HEMATOLOGY ONCOLOGY PROGRESS NOTE  Date of service:  12/11/18     Patient Care Team: [name], MD as PCP - General (Internal Medicine)  CC F/u for mx of essential thrombocythemia  Diagnosis:  JAK2 Essential thrombocytosis   Current Treatment: Hydroxyurea 1000 mg Mon through Friday and 500mg  po on sat and sun  INTERVAL HISTORY:  Jordan Vazquez is here to follow up on his Jak2 positive essential thrombocytosis. The patient's last visit with [place] was on 09/11/18. The pt reports that he is doing well overall.   The pt reports that he has not developed any new concerns since his last visit. He notes that he continues tolerating Hydroxyurea very well. The pt denies any medication changes or new medical concerns. He endorses good energy levels. He continues on 325mg  aspirin daily, denying any abnormal bruising. He notes that he has been keeping active as well.   Lab results today (12/11/18) of CBC w/diff and CMP is as follows: all values are WNL except for RBC at 4.12, MCV at 106.8, MCH at 36.4, BUN at 24.  On review of systems, pt reports good energy levels, eating well, stable weight, keeping active, and denies abdominal pains, skin rashes, leg swelling, and any other symptoms.   REVIEW OF SYSTEMS:    A 10+ POINT REVIEW OF SYSTEMS WAS OBTAINED including neurology, dermatology, psychiatry, cardiac, respiratory, lymph, extremities, GI, GU, Musculoskeletal, constitutional, breasts, reproductive, HEENT.  All pertinent positives are noted in the HPI.  All others are negative.    Past Medical History:  Diagnosis Date  . 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   . Hypertension     Past Surgical History:  Procedure Laterality Date  . CARDIAC CATHETERIZATION     16 years ago negative   . HERNIA REPAIR     right inguinal hernia surgery   . I&D KNEE WITH  POLY EXCHANGE  12/06/2011   Procedure: IRRIGATION AND DEBRIDEMENT KNEE WITH POLY EXCHANGE;  Surgeon: Mauri Pole, MD;  Location: [facility];  Service: Orthopedics;  Laterality: Right;  Right Total Knee Irrigation and Debridement with Poly Exchange  . JOINT REPLACEMENT     left knee 5/12, right TKA 10/12     Social History   Tobacco Use  . Smoking status: Former Smoker    Packs/day: 0.25    Types: Cigarettes    Last attempt to quit: 11/16/1963    Years since quitting: 55.1  . Smokeless tobacco: Never Used  Substance Use Topics  . Alcohol use: Yes    Alcohol/week: 2.0 standard drinks    Types: 2 Cans of beer per week  . Drug use: No    ALLERGIES:  is allergic to niacin and related.  MEDICATIONS:  Current Outpatient Medications  Medication Sig Dispense Refill  . aspirin 325 MG tablet Take 325 mg by mouth daily.     [name] atorvastatin (LIPITOR) 40 MG tablet Take 40 mg by mouth every morning.    . baclofen (LIORESAL) 10 MG tablet TAKE 1/2 TABLET BY MOUTH QID AS NEEDED  0  . chlorthalidone (HYGROTON) 25 MG tablet TK 1 T PO EVERY MORNING  3  . Cholecalciferol (VITAMIN D-3) 1000 UNITS CAPS Take 1,000 Units by mouth daily.    . hydroxyurea (HYDREA) 500 MG capsule Take 2 capsules (1,000 mg total) by mouth daily. TAKE 2 CAPSULES BY MOUTH DAILY.  TAKE WITH FOOD 60 capsule 3  . Multiple Vitamin (MULITIVITAMIN WITH MINERALS) TABS Take 2 tablets by mouth daily.     . Omega-3 Fatty Acids (SEA-OMEGA 30 PO) Take 1 capsule by mouth daily.    [name] oxyCODONE (OXY IR/ROXICODONE) 5 MG immediate release tablet TK 1.5 TS PO Q 6 H PRN  0   No current facility-administered medications for this visit.     PHYSICAL EXAMINATION: ECOG PERFORMANCE STATUS: 1 - Symptomatic but completely ambulatory  Vitals:   12/11/18 0852  BP: (!) 143/74  Pulse: (!) 57  Resp: 18  Temp: 97.7 F (36.5 C)  SpO2: 100%    Filed Weights   12/11/18 0852  Weight: 213 lb 4.8 oz (96.8 kg)   .Body mass index is 30.61  kg/m.  GENERAL:alert, in no acute distress and comfortable SKIN: no acute rashes, no significant lesions EYES: conjunctiva are pink and non-injected, sclera anicteric OROPHARYNX: MMM, no exudates, no oropharyngeal erythema or ulceration NECK: supple, no JVD LYMPH:  no palpable lymphadenopathy in the cervical, axillary or inguinal regions LUNGS: clear to auscultation b/l with normal respiratory effort HEART: regular rate & rhythm ABDOMEN:  normoactive bowel sounds , non tender, not distended. No palpable hepatosplenomegaly.  Extremity: no pedal edema PSYCH: alert & oriented x 3 with fluent speech NEURO: no focal motor/sensory deficits    LABORATORY DATA:   I have reviewed the data as listed  . CBC Latest Ref Rng & Units 12/11/2018 09/11/2018 05/29/2018  WBC 4.0 - 10.5 K/uL 7.4 9.3 8.2  Hemoglobin 13.0 - 17.0 g/dL 15.0 16.1 16.3  Hematocrit 39.0 - 52.0 % 44.0 47.3 47.9  Platelets 150 - 400 K/uL 359 415(H) 406(H)    . CMP Latest Ref Rng & Units 12/11/2018 09/11/2018 05/29/2018  Glucose 70 - 99 mg/dL 97 91 92  BUN 8 - 23 mg/dL 24(H) 25(H) 22  Creatinine 0.61 - 1.24 mg/dL 1.16 1.18 1.13  Sodium 135 - 145 mmol/L 143 143 141  Potassium 3.5 - 5.1 mmol/L 4.4 4.0 4.3  Chloride 98 - 111 mmol/L 103 103 101  CO2 22 - 32 mmol/L 31 29 30   Calcium 8.9 - 10.3 mg/dL 9.3 9.5 9.5  Total Protein 6.5 - 8.1 g/dL 7.5 7.5 7.5  Total Bilirubin 0.3 - 1.2 mg/dL 0.7 1.0 0.7  Alkaline Phos 38 - 126 U/L 69 68 84  AST 15 - 41 U/L 29 40 42(H)  ALT 0 - 44 U/L 28 48(H) 66(H)    RADIOGRAPHIC STUDIES: I have personally reviewed the radiological images as listed and agreed with the findings in the report.  [place] abd 03/15/2017: IMPRESSION: 1. Liver is echogenic consistent with fatty infiltration and/or hepatocellular disease. No focal hepatic abnormality. No gallstones or biliary distention.  2. Spleen is enlarged at 14.4 cm with a volume of 620 cc.  Electronically Signed   By: Marcello Moores  Register   On:  03/15/2017 09:15   ASSESSMENT & PLAN: '  72 y.o.  Caucasian male with   #1  Jak2 positive Essential Thrombocytosis.   PLAN:  -Discussed pt labwork today, 12/11/18; PLT at 359k and at goal -The pt has no prohibitive toxicities from continuing 1000mg  Hydroxyurea at this time. -Advised active sunscreen protection as the weather begins to warm due to the potential harmful effects with his Hydroxyurea.  -Recommend using compression socks when the pt travels long distances to reduce the risk of blood clots. -Maintain good PO hydration.  -Continue 325mg  aspirin -Will see the pt back in 3  months   #2 Abnormal LFTs -Liver functions unchanged but AST and ALT still a little elevated, most likely related to his fatty liver. -will continue to monitor.   RTC with Dr Irene Limbo in 3 months with labs   The total time spent in the appt was 20 minutes and more than 50% was on counseling and direct patient cares.    Sullivan Lone MD [place] AAHIVMS [facility] Hematology/Oncology Physician [facility]  (Office):       [phone] (Work cell):  [phone] (Fax):           [phone]   I, Baldwin Jamaica, am acting as a scribe for Dr. Sullivan Lone.   .I have reviewed the above documentation for accuracy and completeness, and I agree with the above. Brunetta Genera MD

## 2018-12-11 ENCOUNTER — Inpatient Hospital Stay: Payer: Medicare Other | Attending: Hematology

## 2018-12-11 ENCOUNTER — Telehealth: Payer: Self-pay | Admitting: Hematology

## 2018-12-11 ENCOUNTER — Inpatient Hospital Stay (HOSPITAL_BASED_OUTPATIENT_CLINIC_OR_DEPARTMENT_OTHER): Payer: Medicare Other | Admitting: Hematology

## 2018-12-11 VITALS — BP 143/74 | HR 57 | Temp 97.7°F | Resp 18 | Ht 70.0 in | Wt 213.3 lb

## 2018-12-11 DIAGNOSIS — D473 Essential (hemorrhagic) thrombocythemia: Secondary | ICD-10-CM

## 2018-12-11 DIAGNOSIS — Z87891 Personal history of nicotine dependence: Secondary | ICD-10-CM | POA: Insufficient documentation

## 2018-12-11 DIAGNOSIS — R945 Abnormal results of liver function studies: Secondary | ICD-10-CM | POA: Diagnosis not present

## 2018-12-11 DIAGNOSIS — I10 Essential (primary) hypertension: Secondary | ICD-10-CM | POA: Insufficient documentation

## 2018-12-11 DIAGNOSIS — Z79899 Other long term (current) drug therapy: Secondary | ICD-10-CM

## 2018-12-11 DIAGNOSIS — Z7982 Long term (current) use of aspirin: Secondary | ICD-10-CM | POA: Insufficient documentation

## 2018-12-11 LAB — CMP (CANCER CENTER ONLY)
ALBUMIN: 4.1 g/dL (ref 3.5–5.0)
ALK PHOS: 69 U/L (ref 38–126)
ALT: 28 U/L (ref 0–44)
AST: 29 U/L (ref 15–41)
Anion gap: 9 (ref 5–15)
BILIRUBIN TOTAL: 0.7 mg/dL (ref 0.3–1.2)
BUN: 24 mg/dL — ABNORMAL HIGH (ref 8–23)
CALCIUM: 9.3 mg/dL (ref 8.9–10.3)
CO2: 31 mmol/L (ref 22–32)
CREATININE: 1.16 mg/dL (ref 0.61–1.24)
Chloride: 103 mmol/L (ref 98–111)
GFR, Estimated: >60 mL/min (ref >60)
Glucose, Bld: 97 mg/dL (ref 70–99)
Potassium: 4.4 mmol/L (ref 3.5–5.1)
SODIUM: 143 mmol/L (ref 135–145)
Total Protein: 7.5 g/dL (ref 6.5–8.1)

## 2018-12-11 LAB — CBC WITH DIFFERENTIAL (CANCER CENTER ONLY)
Abs Immature Granulocytes: 0.03 10*3/uL (ref 0.00–0.07)
BASOS ABS: 0 10*3/uL (ref 0.0–0.1)
Basophils Relative: 1 %
EOS ABS: 0.2 10*3/uL (ref 0.0–0.5)
EOS PCT: 3 %
HCT: 44 % (ref 39.0–52.0)
Hemoglobin: 15 g/dL (ref 13.0–17.0)
Immature Granulocytes: 0 %
LYMPHS PCT: 16 %
Lymphs Abs: 1.2 10*3/uL (ref 0.7–4.0)
MCH: 36.4 pg — ABNORMAL HIGH (ref 26.0–34.0)
MCHC: 34.1 g/dL (ref 30.0–36.0)
MCV: 106.8 fL — ABNORMAL HIGH (ref 80.0–100.0)
Monocytes Absolute: 0.9 10*3/uL (ref 0.1–1.0)
Monocytes Relative: 12 %
NEUTROS PCT: 68 %
NRBC: 0 % (ref 0.0–0.2)
Neutro Abs: 5 10*3/uL (ref 1.7–7.7)
Platelet Count: 359 10*3/uL (ref 150–400)
RBC: 4.12 MIL/uL — AB (ref 4.22–5.81)
RDW: 13.9 % (ref 11.5–15.5)
WBC: 7.4 10*3/uL (ref 4.0–10.5)

## 2018-12-11 MED ORDER — HYDROXYUREA 500 MG PO CAPS: 1000.0000 mg | ORAL_CAPSULE | Freq: Every day | ORAL | 3 refills | Status: DC

## 2018-12-11 NOTE — Patient Instructions (Signed)
Thank you for choosing [facility] to provide your oncology and hematology care.  To afford each patient quality time with our providers, please arrive 30 minutes before your scheduled appointment time.  If you arrive late for your appointment, you may be asked to reschedule.  We strive to give you quality time with our providers, and arriving late affects you and other patients whose appointments are after yours.    If you are a no show for multiple scheduled visits, you may be dismissed from the clinic at the providers discretion.     Again, thank you for choosing [facility], our hope is that these requests will decrease the amount of time that you wait before being seen by our physicians.  ______________________________________________________________________   Should you have questions after your visit to the [facility], please contact our office at [phone] between the hours of 8:30 and 4:30 p.m.    Voicemails left after 4:30p.m will not be returned until the following business day.     For prescription refill requests, please have your pharmacy contact us directly.  Please also try to allow 48 hours for prescription requests.     Please contact the scheduling department for questions regarding scheduling.  For scheduling of procedures such as PET scans, CT scans, MRI, Ultrasound, etc please contact central scheduling at [phone].     Resources For Cancer Patients and Caregivers:    Oncolink.org:  A wonderful resource for patients and healthcare providers for information regarding your disease, ways to tract your treatment, what to expect, etc.      American Cancer Society:  800-227-2345  Can help patients locate various types of support and financial assistance   Cancer Care: 1-800-813-HOPE (4673) Provides financial assistance, online support groups, medication/co-pay assistance.     [place] DSS:  336-641-3447 Where to apply  for food stamps, Medicaid, and utility assistance   Medicare Rights Center: 800-333-4114 Helps people with Medicare understand their rights and benefits, navigate the Medicare system, and secure the quality healthcare they deserve   SCAT: 336-333-6589 [employer] [employer]'s shared-ride transportation service for eligible riders who have a disability that prevents them from riding the fixed route bus.     For additional information on assistance programs please contact our social worker:   Abigail Elmore:  [phone]  

## 2018-12-11 NOTE — Telephone Encounter (Signed)
Scheduled appt per 01/27 los. ° °Printed calendar and avs. °

## 2018-12-26 DIAGNOSIS — G894 Chronic pain syndrome: Secondary | ICD-10-CM | POA: Diagnosis not present

## 2018-12-26 DIAGNOSIS — M62830 Muscle spasm of back: Secondary | ICD-10-CM | POA: Diagnosis not present

## 2018-12-26 DIAGNOSIS — M150 Primary generalized (osteo)arthritis: Secondary | ICD-10-CM | POA: Diagnosis not present

## 2018-12-26 DIAGNOSIS — M47817 Spondylosis without myelopathy or radiculopathy, lumbosacral region: Secondary | ICD-10-CM | POA: Diagnosis not present

## 2019-02-11 ENCOUNTER — Other Ambulatory Visit: Payer: Self-pay | Admitting: Hematology

## 2019-02-20 DIAGNOSIS — M62830 Muscle spasm of back: Secondary | ICD-10-CM | POA: Diagnosis not present

## 2019-02-20 DIAGNOSIS — M150 Primary generalized (osteo)arthritis: Secondary | ICD-10-CM | POA: Diagnosis not present

## 2019-02-20 DIAGNOSIS — G894 Chronic pain syndrome: Secondary | ICD-10-CM | POA: Diagnosis not present

## 2019-02-20 DIAGNOSIS — M47817 Spondylosis without myelopathy or radiculopathy, lumbosacral region: Secondary | ICD-10-CM | POA: Diagnosis not present

## 2019-03-05 NOTE — Progress Notes (Signed)
Jordan Vazquez  HEMATOLOGY ONCOLOGY PROGRESS NOTE  Date of service:  03/12/19    Patient Care Team: [name], MD as PCP - General (Internal Medicine)  CC F/u for mx of Essential thrombocythemia  Diagnosis:  JAK2 Essential thrombocytosis   Current Treatment: Hydroxyurea 1000 mg daily  INTERVAL HISTORY:  Jordan Vazquez is here to follow up on his Jak2 positive essential thrombocytosis. The patient's last visit with [place] was on 12/11/18. The pt reports that he is doing well overall.  The pt reports that he has not developed any new concerns in the interim. He notes that he has continued to tolerate 1000mg  Hydroxyurea daily well and denies mouth sores or skin rashes. He has stayed active and has avoided crowds and denies any concerns for infections.  Lab results today (03/12/19) of CBC w/diff and CMP is as follows: all values are WNL except for RBC at 4.10, MCV at 106.6, MCH at 36.8, BUN at 24, Calcium at 8.8. 03/12/19 LDH is . Lab Results  Component Value Date   LDH 236 (H) 03/12/2019   On review of systems, pt reports good energy levels, staying active, eating well, moving his bowels well, and denies mouth ulcers, skin sores, skin rashes, concerns for infections, abdominal pains, nausea, vomiting, leg swelling, and any other symptoms.    REVIEW OF SYSTEMS:    A 10+ POINT REVIEW OF SYSTEMS WAS OBTAINED including neurology, dermatology, psychiatry, cardiac, respiratory, lymph, extremities, GI, GU, Musculoskeletal, constitutional, breasts, reproductive, HEENT.  All pertinent positives are noted in the HPI.  All others are negative.    Past Medical History:  Diagnosis Date  . 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   . Hypertension     Past Surgical History:  Procedure Laterality Date  . CARDIAC CATHETERIZATION     16 years ago negative   . HERNIA REPAIR     right inguinal hernia surgery   . I&D KNEE WITH POLY EXCHANGE  12/06/2011   Procedure: IRRIGATION AND DEBRIDEMENT KNEE WITH POLY EXCHANGE;  Surgeon: Mauri Pole, MD;  Location: [facility];  Service: Orthopedics;  Laterality: Right;  Right Total Knee Irrigation and Debridement with Poly Exchange  . JOINT REPLACEMENT     left knee 5/12, right TKA 10/12     Social History   Tobacco Use  . Smoking status: Former Smoker    Packs/day: 0.25    Types: Cigarettes    Last attempt to quit: 11/16/1963    Years since quitting: 55.3  . Smokeless tobacco: Never Used  Substance Use Topics  . Alcohol use: Yes    Alcohol/week: 2.0 standard drinks    Types: 2 Cans of beer per week  . Drug use: No    ALLERGIES:  is allergic to niacin and related.  MEDICATIONS:  Current Outpatient Medications  Medication Sig Dispense Refill  . aspirin 325 MG tablet Take 325 mg by mouth daily.     Jordan Vazquez atorvastatin (LIPITOR) 40 MG tablet Take 40 mg by mouth every morning.    . baclofen (LIORESAL) 10 MG tablet TAKE 1/2 TABLET BY MOUTH QID AS NEEDED  0  . chlorthalidone (HYGROTON) 25 MG tablet TK 1 T PO EVERY MORNING  3  . Cholecalciferol (VITAMIN D-3) 1000 UNITS CAPS Take 1,000 Units by mouth daily.    . hydroxyurea (HYDREA) 500 MG capsule TAKE 2 CAPSULES(1000 MG) BY MOUTH DAILY WITH  FOOD 60 capsule 3  . Multiple Vitamin (MULITIVITAMIN WITH MINERALS) TABS Take 2 tablets by mouth daily.     . Omega-3 Fatty Acids (SEA-OMEGA 30 PO) Take 1 capsule by mouth daily.    Jordan Vazquez oxyCODONE (OXY IR/ROXICODONE) 5 MG immediate release tablet TK 1.5 TS PO Q 6 H PRN  0   No current facility-administered medications for this visit.     PHYSICAL EXAMINATION: ECOG PERFORMANCE STATUS: 1 - Symptomatic but completely ambulatory  Vitals:   03/12/19 0859  BP: (!) 147/72  Pulse: (!) 59  Resp: 18  Temp: 97.7 F (36.5 C)  SpO2: 99%    Filed Weights   03/12/19 0859  Weight: 211 lb 3.2 oz (95.8 kg)   .Body mass index is 30.3 kg/m.   GENERAL:alert, in no acute distress and comfortable SKIN: no acute rashes, no significant lesions EYES: conjunctiva are pink and non-injected, sclera anicteric OROPHARYNX: MMM, no exudates, no oropharyngeal erythema or ulceration NECK: supple, no JVD LYMPH:  no palpable lymphadenopathy in the cervical, axillary or inguinal regions LUNGS: clear to auscultation b/l with normal respiratory effort HEART: regular rate & rhythm ABDOMEN:  normoactive bowel sounds , non tender, not distended. No palpable hepatosplenomegaly.  Extremity: no pedal edema PSYCH: alert & oriented x 3 with fluent speech NEURO: no focal motor/sensory deficits   LABORATORY DATA:   I have reviewed the data as listed  . CBC Latest Ref Rng & Units 03/12/2019 12/11/2018 09/11/2018  WBC 4.0 - 10.5 K/uL 8.0 7.4 9.3  Hemoglobin 13.0 - 17.0 g/dL 15.1 15.0 16.1  Hematocrit 39.0 - 52.0 % 43.7 44.0 47.3  Platelets 150 - 400 K/uL 368 359 415(H)    . CMP Latest Ref Rng & Units 03/12/2019 12/11/2018 09/11/2018  Glucose 70 - 99 mg/dL 93 97 91  BUN 8 - 23 mg/dL 24(H) 24(H) 25(H)  Creatinine 0.61 - 1.24 mg/dL 1.05 1.16 1.18  Sodium 135 - 145 mmol/L 142 143 143  Potassium 3.5 - 5.1 mmol/L 3.9 4.4 4.0  Chloride 98 - 111 mmol/L 103 103 103  CO2 22 - 32 mmol/L 29 31 29   Calcium 8.9 - 10.3 mg/dL 8.8(L) 9.3 9.5  Total Protein 6.5 - 8.1 g/dL 7.3 7.5 7.5  Total Bilirubin 0.3 - 1.2 mg/dL 0.6 0.7 1.0  Alkaline Phos 38 - 126 U/L 66 69 68  AST 15 - 41 U/L 35 29 40  ALT 0 - 44 U/L 32 28 48(H)    RADIOGRAPHIC STUDIES: I have personally reviewed the radiological images as listed and agreed with the findings in the report.  [place] abd 03/15/2017: IMPRESSION: 1. Liver is echogenic consistent with fatty infiltration and/or hepatocellular disease. No focal hepatic abnormality. No gallstones or biliary distention.  2. Spleen is enlarged at 14.4 cm with a volume of 620 cc.  Electronically Signed   By: Marcello Moores  Register   On: 03/15/2017  09:15   ASSESSMENT & PLAN: '  73 y.o.  Caucasian male with   #1  Jak2 positive Essential Thrombocytosis.   PLAN:  -Discussed pt labwork today, 03/12/19; HGB at 15.1, HCT at 43.7%, MCV at 106.6 and indicative of medication compliance. WBC normal at 8.0k. PLT normal at 368k. Chemistries are stable. -03/12/19 LDH is pending -The pt has no prohibitive toxicities from continuing 1000mg  Hydroxyurea daily at this time. -Advised skin protections strategies as Hydroxyurea can sensitize the skin -Recommend using compression socks when the pt travels long distances to reduce the risk of blood clots. -Maintain good PO hydration.  -  Continue 325mg  aspirin -Will see the pt back in 3 months  #2 Abnormal LFTs - AST and ALT have normalized on labs today. -will continue to monitor.   RTC with Dr Irene Limbo with labs in 3 months   The total time spent in the appt was 15 minutes and more than 50% was on counseling and direct patient cares.    Sullivan Lone MD [place] AAHIVMS [facility] Hematology/Oncology Physician [facility]  (Office):       [phone] (Work cell):  [phone] (Fax):           [phone]   I, Baldwin [place], am acting as a scribe for Dr. Sullivan Lone.   .I have reviewed the above documentation for accuracy and completeness, and I agree with the above.

## 2019-03-12 ENCOUNTER — Inpatient Hospital Stay: Payer: Medicare Other | Attending: Hematology

## 2019-03-12 ENCOUNTER — Inpatient Hospital Stay (HOSPITAL_BASED_OUTPATIENT_CLINIC_OR_DEPARTMENT_OTHER): Payer: Medicare Other | Admitting: Hematology

## 2019-03-12 ENCOUNTER — Other Ambulatory Visit: Payer: Self-pay

## 2019-03-12 ENCOUNTER — Telehealth: Payer: Self-pay | Admitting: Hematology

## 2019-03-12 VITALS — BP 147/72 | HR 59 | Temp 97.7°F | Resp 18 | Ht 70.0 in | Wt 211.2 lb

## 2019-03-12 DIAGNOSIS — I10 Essential (primary) hypertension: Secondary | ICD-10-CM

## 2019-03-12 DIAGNOSIS — Z87891 Personal history of nicotine dependence: Secondary | ICD-10-CM

## 2019-03-12 DIAGNOSIS — Z79899 Other long term (current) drug therapy: Secondary | ICD-10-CM | POA: Insufficient documentation

## 2019-03-12 DIAGNOSIS — D473 Essential (hemorrhagic) thrombocythemia: Secondary | ICD-10-CM | POA: Insufficient documentation

## 2019-03-12 DIAGNOSIS — M1990 Unspecified osteoarthritis, unspecified site: Secondary | ICD-10-CM | POA: Insufficient documentation

## 2019-03-12 DIAGNOSIS — Z7982 Long term (current) use of aspirin: Secondary | ICD-10-CM

## 2019-03-12 LAB — CBC WITH DIFFERENTIAL/PLATELET
Abs Immature Granulocytes: 0.02 10*3/uL (ref 0.00–0.07)
Basophils Absolute: 0.1 10*3/uL (ref 0.0–0.1)
Basophils Relative: 1 %
Eosinophils Absolute: 0.2 10*3/uL (ref 0.0–0.5)
Eosinophils Relative: 3 %
HCT: 43.7 % (ref 39.0–52.0)
Hemoglobin: 15.1 g/dL (ref 13.0–17.0)
Immature Granulocytes: 0 %
Lymphocytes Relative: 13 %
Lymphs Abs: 1 10*3/uL (ref 0.7–4.0)
MCH: 36.8 pg — ABNORMAL HIGH (ref 26.0–34.0)
MCHC: 34.6 g/dL (ref 30.0–36.0)
MCV: 106.6 fL — ABNORMAL HIGH (ref 80.0–100.0)
Monocytes Absolute: 0.9 10*3/uL (ref 0.1–1.0)
Monocytes Relative: 11 %
Neutro Abs: 5.8 10*3/uL (ref 1.7–7.7)
Neutrophils Relative %: 72 %
Platelets: 368 10*3/uL (ref 150–400)
RBC: 4.1 MIL/uL — ABNORMAL LOW (ref 4.22–5.81)
RDW: 13.1 % (ref 11.5–15.5)
WBC: 8 10*3/uL (ref 4.0–10.5)
nRBC: 0 % (ref 0.0–0.2)

## 2019-03-12 LAB — CMP (CANCER CENTER ONLY)
ALT: 32 U/L (ref 0–44)
AST: 35 U/L (ref 15–41)
Albumin: 4.1 g/dL (ref 3.5–5.0)
Alkaline Phosphatase: 66 U/L (ref 38–126)
Anion gap: 10 (ref 5–15)
BUN: 24 mg/dL — ABNORMAL HIGH (ref 8–23)
CO2: 29 mmol/L (ref 22–32)
Calcium: 8.8 mg/dL — ABNORMAL LOW (ref 8.9–10.3)
Chloride: 103 mmol/L (ref 98–111)
Creatinine: 1.05 mg/dL (ref 0.61–1.24)
GFR, Est AFR Am: >60 mL/min (ref >60)
GFR, Estimated: >60 mL/min (ref >60)
Glucose, Bld: 93 mg/dL (ref 70–99)
Potassium: 3.9 mmol/L (ref 3.5–5.1)
Sodium: 142 mmol/L (ref 135–145)
Total Bilirubin: 0.6 mg/dL (ref 0.3–1.2)
Total Protein: 7.3 g/dL (ref 6.5–8.1)

## 2019-03-12 LAB — LACTATE DEHYDROGENASE: LDH: 236 U/L — ABNORMAL HIGH (ref 98–192)

## 2019-03-12 MED ORDER — HYDROXYUREA 500 MG PO CAPS: ORAL_CAPSULE | ORAL | 3 refills | Status: DC

## 2019-03-12 NOTE — Telephone Encounter (Signed)
Scheduled appt per 4/27 los. ° °A calendar will be mailed out. °

## 2019-04-06 DIAGNOSIS — M79672 Pain in left foot: Secondary | ICD-10-CM | POA: Diagnosis not present

## 2019-04-06 DIAGNOSIS — M5416 Radiculopathy, lumbar region: Secondary | ICD-10-CM | POA: Insufficient documentation

## 2019-04-06 DIAGNOSIS — M79671 Pain in right foot: Secondary | ICD-10-CM | POA: Diagnosis not present

## 2019-04-06 DIAGNOSIS — L97519 Non-pressure chronic ulcer of other part of right foot with unspecified severity: Secondary | ICD-10-CM | POA: Diagnosis not present

## 2019-04-16 DIAGNOSIS — L97519 Non-pressure chronic ulcer of other part of right foot with unspecified severity: Secondary | ICD-10-CM | POA: Diagnosis not present

## 2019-04-24 DIAGNOSIS — M47817 Spondylosis without myelopathy or radiculopathy, lumbosacral region: Secondary | ICD-10-CM | POA: Diagnosis not present

## 2019-04-24 DIAGNOSIS — M150 Primary generalized (osteo)arthritis: Secondary | ICD-10-CM | POA: Diagnosis not present

## 2019-04-24 DIAGNOSIS — M62830 Muscle spasm of back: Secondary | ICD-10-CM | POA: Diagnosis not present

## 2019-04-24 DIAGNOSIS — G894 Chronic pain syndrome: Secondary | ICD-10-CM | POA: Diagnosis not present

## 2019-06-08 NOTE — Progress Notes (Signed)
HEMATOLOGY ONCOLOGY PROGRESS NOTE  Date of service:  06/11/19    Patient Care Team: [name], MD as PCP - General (Internal Medicine)  CC F/u for mx of Essential thrombocythemia  Diagnosis:  JAK2 Essential thrombocytosis   Current Treatment: Hydroxyurea 1000 mg daily  INTERVAL HISTORY:  Jordan Vazquez is here to follow up on his Jak2 positive essential thrombocytosis. The patient's last visit with [place] was on 03/12/2019. The pt reports that he is doing well overall.  The pt reports no new concerns. He is taking his hydroxyurea without issues and staying active.  Lab results today (06/11/2019) of CBC w/diff and CMP is as follows: all values are WNL except for RBC at 4.05, MCV at 106.2, MCH at 36.8, monocytes abs at 1.1k.  On review of systems, pt reports some ankle edema staying active and denies leg ulcers, mouth sores, belly pain and any other symptoms.   REVIEW OF SYSTEMS:    A 10+ POINT REVIEW OF SYSTEMS WAS OBTAINED including neurology, dermatology, psychiatry, cardiac, respiratory, lymph, extremities, GI, GU, Musculoskeletal, constitutional, breasts, reproductive, HEENT.  All pertinent positives are noted in the HPI.  All others are negative.    Past Medical History:  Diagnosis Date  . 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   . Hypertension     Past Surgical History:  Procedure Laterality Date  . CARDIAC CATHETERIZATION     16 years ago negative   . HERNIA REPAIR     right inguinal hernia surgery   . I&D KNEE WITH POLY EXCHANGE  12/06/2011   Procedure: IRRIGATION AND DEBRIDEMENT KNEE WITH POLY EXCHANGE;  Surgeon: Mauri Pole, MD;  Location: [facility];  Service: Orthopedics;  Laterality: Right;  Right Total Knee Irrigation and Debridement with Poly Exchange  . JOINT REPLACEMENT     left knee 5/12, right TKA 10/12     Social History    Tobacco Use  . Smoking status: Former Smoker    Packs/day: 0.25    Types: Cigarettes    Quit date: 11/16/1963    Years since quitting: 55.6  . Smokeless tobacco: Never Used  Substance Use Topics  . Alcohol use: Yes    Alcohol/week: 2.0 standard drinks    Types: 2 Cans of beer per week  . Drug use: No    ALLERGIES:  is allergic to niacin and related.  MEDICATIONS:  Current Outpatient Medications  Medication Sig Dispense Refill  . aspirin 325 MG tablet Take 325 mg by mouth daily.     [name] atorvastatin (LIPITOR) 40 MG tablet Take 40 mg by mouth every morning.    . baclofen (LIORESAL) 10 MG tablet TAKE 1/2 TABLET BY MOUTH QID AS NEEDED  0  . chlorthalidone (HYGROTON) 25 MG tablet TK 1 T PO EVERY MORNING  3  . Cholecalciferol (VITAMIN D-3) 1000 UNITS CAPS Take 1,000 Units by mouth daily.    . hydroxyurea (HYDREA) 500 MG capsule TAKE 2 CAPSULES(1000 MG) BY MOUTH DAILY WITH FOOD 180 capsule 3  . Multiple Vitamin (MULITIVITAMIN WITH MINERALS) TABS Take 2 tablets by mouth daily.     . Omega-3 Fatty Acids (SEA-OMEGA 30 PO) Take 1 capsule by mouth daily.    [name] oxyCODONE (OXY IR/ROXICODONE) 5 MG immediate release tablet TK 1.5 TS PO Q 6 H PRN  0   No current facility-administered medications for this visit.  PHYSICAL EXAMINATION: ECOG PERFORMANCE STATUS: 1 - Symptomatic but completely ambulatory  Vitals:   06/11/19 0847  BP: (!) 149/76  Pulse: (!) 58  Resp: 20  Temp: (!) 97.5 F (36.4 C)  SpO2: 100%    Filed Weights   06/11/19 0847  Weight: 211 lb 9.6 oz (96 kg)   .Body mass index is 30.36 kg/m.   GENERAL:alert, in no acute distress and comfortable SKIN: no acute rashes, no significant lesions EYES: conjunctiva are pink and non-injected, sclera anicteric OROPHARYNX: MMM, no exudates, no oropharyngeal erythema or ulceration NECK: supple, no JVD LYMPH:  no palpable lymphadenopathy in the cervical, axillary or inguinal regions LUNGS: clear to auscultation b/l with normal  respiratory effort HEART: regular rate & rhythm ABDOMEN:  normoactive bowel sounds , non tender, not distended. No palpable hepatosplenomegaly.  Extremity: trace pedal edema, prominent veins in lower legs PSYCH: alert & oriented x 3 with fluent speech NEURO: no focal motor/sensory deficits   LABORATORY DATA:   I have reviewed the data as listed  . CBC Latest Ref Rng & Units 06/11/2019 03/12/2019 12/11/2018  WBC 4.0 - 10.5 K/uL 7.9 8.0 7.4  Hemoglobin 13.0 - 17.0 g/dL 14.9 15.1 15.0  Hematocrit 39.0 - 52.0 % 43.0 43.7 44.0  Platelets 150 - 400 K/uL 356 368 359    . CMP Latest Ref Rng & Units 06/11/2019 03/12/2019 12/11/2018  Glucose 70 - 99 mg/dL 78 93 97  BUN 8 - 23 mg/dL 22 24(H) 24(H)  Creatinine 0.61 - 1.24 mg/dL 1.06 1.05 1.16  Sodium 135 - 145 mmol/L 141 142 143  Potassium 3.5 - 5.1 mmol/L 3.6 3.9 4.4  Chloride 98 - 111 mmol/L 103 103 103  CO2 22 - 32 mmol/L 29 29 31   Calcium 8.9 - 10.3 mg/dL 9.3 8.8(L) 9.3  Total Protein 6.5 - 8.1 g/dL 7.2 7.3 7.5  Total Bilirubin 0.3 - 1.2 mg/dL 0.8 0.6 0.7  Alkaline Phos 38 - 126 U/L 62 66 69  AST 15 - 41 U/L 32 35 29  ALT 0 - 44 U/L 29 32 28    RADIOGRAPHIC STUDIES: I have personally reviewed the radiological images as listed and agreed with the findings in the report.  [place] abd 03/15/2017: IMPRESSION: 1. Liver is echogenic consistent with fatty infiltration and/or hepatocellular disease. No focal hepatic abnormality. No gallstones or biliary distention.  2. Spleen is enlarged at 14.4 cm with a volume of 620 cc.  Electronically Signed   By: Marcello Moores  Register   On: 03/15/2017 09:15   ASSESSMENT & PLAN: '  73 y.o.  Caucasian male with   #1  Jak2 positive Essential Thrombocytosis.   PLAN: -Discussed pt labwork today, 06/11/2019; PLT stable, Liver function remains stable -Recommended avoiding excessive sun exposure as hydroxyurea can cause skin sensitivity -Recommend using compression socks when walking to reduce leg edema  -The pt has no prohibitive toxicities from continuing 1000mg  Hydroxyurea daily at this time. -Refill Rx hydroxyurea -Will see the pt back in 3 months  #2 Abnormal LFTs - AST and ALT have normalized on labs today. -will continue to monitor.   RTC with Dr Irene Limbo with labs in 3 months   The total time spent in the appt was 15 minutes and more than 50% was on counseling and direct patient cares.  Sullivan Lone MD [place] AAHIVMS [facility] Hematology/Oncology Physician [facility]  (Office):       [phone] (Work cell):  [phone] (Fax):  [phone]   Marylen Ponto, am acting as a scribe for Dr. Irene Limbo  .I have reviewed the above documentation for accuracy and completeness, and I agree with the above. Brunetta Genera MD

## 2019-06-11 ENCOUNTER — Inpatient Hospital Stay: Payer: Medicare Other | Attending: Hematology

## 2019-06-11 ENCOUNTER — Inpatient Hospital Stay (HOSPITAL_BASED_OUTPATIENT_CLINIC_OR_DEPARTMENT_OTHER): Payer: Medicare Other | Admitting: Hematology

## 2019-06-11 ENCOUNTER — Telehealth: Payer: Self-pay | Admitting: Hematology

## 2019-06-11 ENCOUNTER — Other Ambulatory Visit: Payer: Self-pay

## 2019-06-11 VITALS — BP 149/76 | HR 58 | Temp 97.5°F | Resp 20 | Wt 211.6 lb

## 2019-06-11 DIAGNOSIS — Z79899 Other long term (current) drug therapy: Secondary | ICD-10-CM

## 2019-06-11 DIAGNOSIS — Z7982 Long term (current) use of aspirin: Secondary | ICD-10-CM | POA: Insufficient documentation

## 2019-06-11 DIAGNOSIS — M1990 Unspecified osteoarthritis, unspecified site: Secondary | ICD-10-CM

## 2019-06-11 DIAGNOSIS — I10 Essential (primary) hypertension: Secondary | ICD-10-CM | POA: Insufficient documentation

## 2019-06-11 DIAGNOSIS — D473 Essential (hemorrhagic) thrombocythemia: Secondary | ICD-10-CM | POA: Insufficient documentation

## 2019-06-11 DIAGNOSIS — E785 Hyperlipidemia, unspecified: Secondary | ICD-10-CM | POA: Diagnosis not present

## 2019-06-11 DIAGNOSIS — R600 Localized edema: Secondary | ICD-10-CM | POA: Insufficient documentation

## 2019-06-11 DIAGNOSIS — Z87891 Personal history of nicotine dependence: Secondary | ICD-10-CM | POA: Diagnosis not present

## 2019-06-11 LAB — CBC WITH DIFFERENTIAL/PLATELET
Abs Immature Granulocytes: 0.02 10*3/uL (ref 0.00–0.07)
Basophils Absolute: 0.1 10*3/uL (ref 0.0–0.1)
Basophils Relative: 1 %
Eosinophils Absolute: 0.2 10*3/uL (ref 0.0–0.5)
Eosinophils Relative: 3 %
HCT: 43 % (ref 39.0–52.0)
Hemoglobin: 14.9 g/dL (ref 13.0–17.0)
Immature Granulocytes: 0 %
Lymphocytes Relative: 15 %
Lymphs Abs: 1.2 10*3/uL (ref 0.7–4.0)
MCH: 36.8 pg — ABNORMAL HIGH (ref 26.0–34.0)
MCHC: 34.7 g/dL (ref 30.0–36.0)
MCV: 106.2 fL — ABNORMAL HIGH (ref 80.0–100.0)
Monocytes Absolute: 1.1 10*3/uL — ABNORMAL HIGH (ref 0.1–1.0)
Monocytes Relative: 14 %
Neutro Abs: 5.3 10*3/uL (ref 1.7–7.7)
Neutrophils Relative %: 67 %
Platelets: 356 10*3/uL (ref 150–400)
RBC: 4.05 MIL/uL — ABNORMAL LOW (ref 4.22–5.81)
RDW: 13.4 % (ref 11.5–15.5)
WBC: 7.9 10*3/uL (ref 4.0–10.5)
nRBC: 0 % (ref 0.0–0.2)

## 2019-06-11 LAB — CMP (CANCER CENTER ONLY)
ALT: 29 U/L (ref 0–44)
AST: 32 U/L (ref 15–41)
Albumin: 4.1 g/dL (ref 3.5–5.0)
Alkaline Phosphatase: 62 U/L (ref 38–126)
Anion gap: 9 (ref 5–15)
BUN: 22 mg/dL (ref 8–23)
CO2: 29 mmol/L (ref 22–32)
Calcium: 9.3 mg/dL (ref 8.9–10.3)
Chloride: 103 mmol/L (ref 98–111)
Creatinine: 1.06 mg/dL (ref 0.61–1.24)
GFR, Est AFR Am: >60 mL/min (ref >60)
GFR, Estimated: >60 mL/min (ref >60)
Glucose, Bld: 78 mg/dL (ref 70–99)
Potassium: 3.6 mmol/L (ref 3.5–5.1)
Sodium: 141 mmol/L (ref 135–145)
Total Bilirubin: 0.8 mg/dL (ref 0.3–1.2)
Total Protein: 7.2 g/dL (ref 6.5–8.1)

## 2019-06-11 NOTE — Telephone Encounter (Signed)
Gave avs and calendar ° °

## 2019-06-26 DIAGNOSIS — M62830 Muscle spasm of back: Secondary | ICD-10-CM | POA: Diagnosis not present

## 2019-06-26 DIAGNOSIS — G894 Chronic pain syndrome: Secondary | ICD-10-CM | POA: Diagnosis not present

## 2019-06-26 DIAGNOSIS — M150 Primary generalized (osteo)arthritis: Secondary | ICD-10-CM | POA: Diagnosis not present

## 2019-06-26 DIAGNOSIS — M47817 Spondylosis without myelopathy or radiculopathy, lumbosacral region: Secondary | ICD-10-CM | POA: Diagnosis not present

## 2019-07-26 ENCOUNTER — Other Ambulatory Visit: Payer: Self-pay | Admitting: Physical Medicine and Rehabilitation

## 2019-07-26 DIAGNOSIS — M5416 Radiculopathy, lumbar region: Secondary | ICD-10-CM

## 2019-08-05 ENCOUNTER — Ambulatory Visit
Admission: RE | Admit: 2019-08-05 | Discharge: 2019-08-05 | Disposition: A | Discharge status: Home / Self Care | Payer: Medicare Other | Source: Ambulatory Visit | Attending: Physical Medicine and Rehabilitation | Admitting: Physical Medicine and Rehabilitation

## 2019-08-05 ENCOUNTER — Other Ambulatory Visit: Payer: Self-pay

## 2019-08-05 DIAGNOSIS — M48061 Spinal stenosis, lumbar region without neurogenic claudication: Secondary | ICD-10-CM | POA: Diagnosis not present

## 2019-08-05 DIAGNOSIS — M5416 Radiculopathy, lumbar region: Secondary | ICD-10-CM

## 2019-08-21 DIAGNOSIS — M47817 Spondylosis without myelopathy or radiculopathy, lumbosacral region: Secondary | ICD-10-CM | POA: Diagnosis not present

## 2019-08-21 DIAGNOSIS — M150 Primary generalized (osteo)arthritis: Secondary | ICD-10-CM | POA: Diagnosis not present

## 2019-08-21 DIAGNOSIS — G894 Chronic pain syndrome: Secondary | ICD-10-CM | POA: Diagnosis not present

## 2019-08-21 DIAGNOSIS — M62830 Muscle spasm of back: Secondary | ICD-10-CM | POA: Diagnosis not present

## 2019-09-03 DIAGNOSIS — D473 Essential (hemorrhagic) thrombocythemia: Secondary | ICD-10-CM | POA: Diagnosis not present

## 2019-09-03 DIAGNOSIS — I10 Essential (primary) hypertension: Secondary | ICD-10-CM | POA: Diagnosis not present

## 2019-09-03 DIAGNOSIS — M5431 Sciatica, right side: Secondary | ICD-10-CM | POA: Diagnosis not present

## 2019-09-03 DIAGNOSIS — E7800 Pure hypercholesterolemia, unspecified: Secondary | ICD-10-CM | POA: Diagnosis not present

## 2019-09-10 ENCOUNTER — Inpatient Hospital Stay: Payer: Medicare Other | Attending: Hematology | Admitting: Hematology

## 2019-09-10 ENCOUNTER — Telehealth: Payer: Self-pay | Admitting: Hematology

## 2019-09-10 ENCOUNTER — Inpatient Hospital Stay: Payer: Medicare Other

## 2019-09-10 ENCOUNTER — Other Ambulatory Visit: Payer: Self-pay

## 2019-09-10 VITALS — BP 140/82 | HR 62 | Temp 98.2°F | Resp 17 | Ht 70.0 in | Wt 215.2 lb

## 2019-09-10 DIAGNOSIS — Z79899 Other long term (current) drug therapy: Secondary | ICD-10-CM | POA: Insufficient documentation

## 2019-09-10 DIAGNOSIS — M1990 Unspecified osteoarthritis, unspecified site: Secondary | ICD-10-CM | POA: Diagnosis not present

## 2019-09-10 DIAGNOSIS — D473 Essential (hemorrhagic) thrombocythemia: Secondary | ICD-10-CM | POA: Insufficient documentation

## 2019-09-10 DIAGNOSIS — Z87891 Personal history of nicotine dependence: Secondary | ICD-10-CM | POA: Diagnosis not present

## 2019-09-10 DIAGNOSIS — R202 Paresthesia of skin: Secondary | ICD-10-CM | POA: Insufficient documentation

## 2019-09-10 DIAGNOSIS — R945 Abnormal results of liver function studies: Secondary | ICD-10-CM | POA: Diagnosis not present

## 2019-09-10 DIAGNOSIS — R161 Splenomegaly, not elsewhere classified: Secondary | ICD-10-CM | POA: Insufficient documentation

## 2019-09-10 DIAGNOSIS — R200 Anesthesia of skin: Secondary | ICD-10-CM | POA: Insufficient documentation

## 2019-09-10 LAB — CMP (CANCER CENTER ONLY)
ALT: 33 U/L (ref 0–44)
AST: 30 U/L (ref 15–41)
Albumin: 4.1 g/dL (ref 3.5–5.0)
Alkaline Phosphatase: 73 U/L (ref 38–126)
Anion gap: 10 (ref 5–15)
BUN: 26 mg/dL — ABNORMAL HIGH (ref 8–23)
CO2: 29 mmol/L (ref 22–32)
Calcium: 8.9 mg/dL (ref 8.9–10.3)
Chloride: 101 mmol/L (ref 98–111)
Creatinine: 1.22 mg/dL (ref 0.61–1.24)
GFR, Est AFR Am: >60 mL/min (ref >60)
GFR, Estimated: 58 mL/min — ABNORMAL LOW (ref >60)
Glucose, Bld: 91 mg/dL (ref 70–99)
Potassium: 4 mmol/L (ref 3.5–5.1)
Sodium: 140 mmol/L (ref 135–145)
Total Bilirubin: 0.7 mg/dL (ref 0.3–1.2)
Total Protein: 7.2 g/dL (ref 6.5–8.1)

## 2019-09-10 LAB — CBC WITH DIFFERENTIAL/PLATELET
Abs Immature Granulocytes: 0.02 10*3/uL (ref 0.00–0.07)
Basophils Absolute: 0 10*3/uL (ref 0.0–0.1)
Basophils Relative: 1 %
Eosinophils Absolute: 0.3 10*3/uL (ref 0.0–0.5)
Eosinophils Relative: 4 %
HCT: 43.6 % (ref 39.0–52.0)
Hemoglobin: 15.5 g/dL (ref 13.0–17.0)
Immature Granulocytes: 0 %
Lymphocytes Relative: 18 %
Lymphs Abs: 1.4 10*3/uL (ref 0.7–4.0)
MCH: 37.1 pg — ABNORMAL HIGH (ref 26.0–34.0)
MCHC: 35.6 g/dL (ref 30.0–36.0)
MCV: 104.3 fL — ABNORMAL HIGH (ref 80.0–100.0)
Monocytes Absolute: 0.8 10*3/uL (ref 0.1–1.0)
Monocytes Relative: 11 %
Neutro Abs: 5.3 10*3/uL (ref 1.7–7.7)
Neutrophils Relative %: 66 %
Platelets: 371 10*3/uL (ref 150–400)
RBC: 4.18 MIL/uL — ABNORMAL LOW (ref 4.22–5.81)
RDW: 13.2 % (ref 11.5–15.5)
WBC: 7.9 10*3/uL (ref 4.0–10.5)
nRBC: 0 % (ref 0.0–0.2)

## 2019-09-10 MED ORDER — HYDROXYUREA 500 MG PO CAPS: ORAL_CAPSULE | ORAL | 3 refills | Status: DC

## 2019-09-10 NOTE — Progress Notes (Signed)
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 The patient's last visit with [place] was on 06/11/2019. The pt reports that he is doing well overall.  The pt reports that he has been eating and sleeping well. He denies any new concerns. He reports no issues taking Hydroxyurea and has had no recent medication changes. He has continued to take his Asprin and has had no bleeding concerns. Pt is experiencing intermittent tingling/numbness in his lower extremities, worse in his right leg and he denies any in his genital or anal areas. Pt also denies any issues with bladder or bowel incontinence. He has had a recent spinal MRI which revealed some disc degeneration. They have also discovered that he has pinched nerves in his lower back. Pt considered injectable steroids but has decided against it for now. Pt reports that he is walking 3-5 miles per day.  Lab results today (09/10/19) of CBC w/diff and CMP is as follows: all values are WNL except for RBC at 4.18, MCV at 104.3, MCH at 37.1, BUN at 26, GFR Est Non Af Am at 58.  On review of systems, pt reports eating well, sleeping well, intermittent tingling/numbness in his legs and denies abdominal pain bowel movement issues, bowel/bladder incontinence, bleeding issues and any other symptoms.   REVIEW OF SYSTEMS:   A 10+ POINT REVIEW OF SYSTEMS WAS OBTAINED including neurology, dermatology, psychiatry, cardiac, respiratory, lymph, extremities, GI, GU, Musculoskeletal, constitutional, breasts, reproductive, HEENT.  All pertinent positives are noted in the HPI.  All others are negative.   Past Medical History:  Diagnosis Date  .  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   . Hypertension     Past Surgical History:  Procedure Laterality Date  . CARDIAC CATHETERIZATION     16 years ago negative   . HERNIA REPAIR     right inguinal hernia surgery   . I&D KNEE WITH POLY EXCHANGE  12/06/2011   Procedure: IRRIGATION AND DEBRIDEMENT KNEE WITH POLY EXCHANGE;  Surgeon: Mauri Pole, MD;  Location: [facility];  Service: Orthopedics;  Laterality: Right;  Right Total Knee Irrigation and Debridement with Poly Exchange  . JOINT REPLACEMENT     left knee 5/12, right TKA 10/12     Social History   Tobacco Use  . Smoking status: Former Smoker    Packs/day: 0.25    Types: Cigarettes    Quit date: 11/16/1963    Years since quitting: 55.8  . Smokeless tobacco: Never Used  Substance Use Topics  . Alcohol use: Yes    Alcohol/week: 2.0 standard drinks    Types: 2 Cans of beer per week  . Drug use: No    ALLERGIES:  is allergic to niacin and related.  MEDICATIONS:  Current Outpatient Medications  Medication Sig Dispense Refill  . aspirin 325 MG tablet Take 325 mg by mouth daily.     [name] atorvastatin (LIPITOR) 40 MG tablet Take 40 mg by mouth every morning.    . baclofen (LIORESAL) 10 MG tablet TAKE 1/2 TABLET BY MOUTH QID AS  NEEDED  0  . chlorthalidone (HYGROTON) 25 MG tablet TK 1 T PO EVERY MORNING  3  . Cholecalciferol (VITAMIN D-3) 1000 UNITS CAPS Take 1,000 Units by mouth daily.    . hydroxyurea (HYDREA) 500 MG capsule TAKE 2 CAPSULES(1000 MG) BY MOUTH DAILY WITH FOOD 180 capsule 3  . Multiple Vitamin (MULITIVITAMIN WITH MINERALS) TABS Take 2 tablets by mouth daily.     . Omega-3 Fatty Acids (SEA-OMEGA 30 PO) Take 1 capsule by mouth daily.    [name] oxyCODONE (OXY IR/ROXICODONE) 5 MG immediate release tablet TK 1.5 TS PO Q 6 H PRN  0   No current facility-administered medications for this visit.      PHYSICAL EXAMINATION: ECOG PERFORMANCE STATUS: 1 - Symptomatic but completely ambulatory  Vitals:   09/10/19 0817  BP: 140/82  Pulse: 62  Resp: 17  Temp: 98.2 F (36.8 C)  SpO2: 99%    Filed Weights   09/10/19 0817  Weight: 215 lb 3.2 oz (97.6 kg)   .Body mass index is 30.88 kg/m.   GENERAL:alert, in no acute distress and comfortable SKIN: no acute rashes, no significant lesions EYES: conjunctiva are pink and non-injected, sclera anicteric OROPHARYNX: MMM, no exudates, no oropharyngeal erythema or ulceration NECK: supple, no JVD LYMPH:  no palpable lymphadenopathy in the cervical, axillary or inguinal regions LUNGS: clear to auscultation b/l with normal respiratory effort HEART: regular rate & rhythm ABDOMEN:  normoactive bowel sounds , non tender, not distended. No palpable hepatosplenomegaly.  Extremity: no pedal edema  PSYCH: alert & oriented x 3 with fluent speech NEURO: no focal motor/sensory deficits  LABORATORY DATA:   I have reviewed the data as listed  . CBC Latest Ref Rng & Units 09/10/2019 06/11/2019 03/12/2019  WBC 4.0 - 10.5 K/uL 7.9 7.9 8.0  Hemoglobin 13.0 - 17.0 g/dL 15.5 14.9 15.1  Hematocrit 39.0 - 52.0 % 43.6 43.0 43.7  Platelets 150 - 400 K/uL 371 356 368    . CMP Latest Ref Rng & Units 09/10/2019 06/11/2019 03/12/2019  Glucose 70 - 99 mg/dL 91 78 93  BUN 8 - 23 mg/dL 26(H) 22 24(H)  Creatinine 0.61 - 1.24 mg/dL 1.22 1.06 1.05  Sodium 135 - 145 mmol/L 140 141 142  Potassium 3.5 - 5.1 mmol/L 4.0 3.6 3.9  Chloride 98 - 111 mmol/L 101 103 103  CO2 22 - 32 mmol/L 29 29 29   Calcium 8.9 - 10.3 mg/dL 8.9 9.3 8.8(L)  Total Protein 6.5 - 8.1 g/dL 7.2 7.2 7.3  Total Bilirubin 0.3 - 1.2 mg/dL 0.7 0.8 0.6  Alkaline Phos 38 - 126 U/L 73 62 66  AST 15 - 41 U/L 30 32 35  ALT 0 - 44 U/L 33 29 32    RADIOGRAPHIC STUDIES: I have personally reviewed the radiological images as listed and agreed with the findings in the report.  [place] abd 03/15/2017:  IMPRESSION: 1. Liver is echogenic consistent with fatty infiltration and/or hepatocellular disease. No focal hepatic abnormality. No gallstones or biliary distention.  2. Spleen is enlarged at 14.4 cm with a volume of 620 cc.  Electronically Signed   By: Marcello Moores  Register   On: 03/15/2017 09:15   ASSESSMENT & PLAN: '  73 y.o.  Caucasian male with   #1  Jak2 positive Essential Thrombocytosis.   PLAN: -Discussed pt labwork today, 09/10/19; blood counts chemistries are stable  -Recommended pt to avoid bending, twisting or lifting anything heavy  -Recommended avoiding excessive sun exposure as hydroxyurea can cause skin  sensitivity -The pt has no prohibitive toxicities from continuing 1000mg  Hydroxyurea daily at this time. -Refill Hydroxyurea  -Will see back in 3 months with labs   #2 Abnormal LFTs - AST and ALT have normalized on labs today. -will continue to monitor.  FOLLOW UP: RTC with Dr Irene Limbo with labs in 3 months  The total time spent in the appt was 15 minutes and more than 50% was on counseling and direct patient cares.  All of the patient's questions were answered with apparent satisfaction. The patient knows to call the clinic with any problems, questions or concerns.   Jordan Lone MD [place] AAHIVMS [facility] Hematology/Oncology Physician [facility]  (Office):       [phone] (Work cell):  [phone] (Fax):           [phone]   I, Yevette Edwards, am acting as a scribe for Dr. Sullivan Vazquez.   .I have reviewed the above documentation for accuracy and completeness, and I agree with the above. Brunetta Genera MD

## 2019-09-10 NOTE — Telephone Encounter (Signed)
Scheduled appt per 10/26 los. ° °Left a VM of the appt date and time. °

## 2019-10-23 DIAGNOSIS — M47817 Spondylosis without myelopathy or radiculopathy, lumbosacral region: Secondary | ICD-10-CM | POA: Diagnosis not present

## 2019-10-23 DIAGNOSIS — G894 Chronic pain syndrome: Secondary | ICD-10-CM | POA: Diagnosis not present

## 2019-10-23 DIAGNOSIS — M62830 Muscle spasm of back: Secondary | ICD-10-CM | POA: Diagnosis not present

## 2019-10-23 DIAGNOSIS — M150 Primary generalized (osteo)arthritis: Secondary | ICD-10-CM | POA: Diagnosis not present

## 2019-12-04 DIAGNOSIS — H52223 Regular astigmatism, bilateral: Secondary | ICD-10-CM | POA: Diagnosis not present

## 2019-12-10 NOTE — Progress Notes (Signed)
HEMATOLOGY ONCOLOGY PROGRESS NOTE  Date of service:  12/11/19    Patient Care Team: [name], MD as PCP - General (Internal Medicine)  CC F/u for mx of Essential thrombocythemia  Diagnosis:  JAK2 Essential thrombocytosis   Current Treatment: Hydroxyurea 1000 mg daily  INTERVAL HISTORY:  [name] is here to follow up on his Jak2 positive essential thrombocytosis. The patient's last visit with [place] was on 09/10/2019. The pt reports that he is doing well overall.  The pt reports that he has been well and has had no new concerns in the interim. He has had no issues tolerating his current dose of Hydroxyurea. Pt has remained active and walks about 5 miles per day. He wears compression socks which are controlling his leg swelling. He plans on receiving the COVID19 vaccine when available.   Lab results today (12/11/19) of CBC w/diff and CMP is as follows: all values are WNL except for RBC at 4.17, MCV at 104.8, MCH at 37.2. 12/11/2019 LDH at 221  On review of systems, pt denies abdominal pain, fevers, chills, night sweats, skin rashes, mouth sores, leg ulcers, leg swelling, leg cramping and any other symptoms.    REVIEW OF SYSTEMS:   A 10+ POINT REVIEW OF SYSTEMS WAS OBTAINED including neurology, dermatology, psychiatry, cardiac, respiratory, lymph, extremities, GI, GU, Musculoskeletal, constitutional, breasts, reproductive, HEENT.  All pertinent positives are noted in the HPI.  All others are negative.   Past Medical History:  Diagnosis Date  . 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   . Hypertension     Past Surgical History:  Procedure Laterality Date  . CARDIAC CATHETERIZATION     16 years ago negative   . HERNIA REPAIR     right inguinal hernia surgery   . I & D KNEE WITH POLY EXCHANGE  12/06/2011   Procedure: IRRIGATION AND DEBRIDEMENT  KNEE WITH POLY EXCHANGE;  Surgeon: Mauri Pole, MD;  Location: [facility];  Service: Orthopedics;  Laterality: Right;  Right Total Knee Irrigation and Debridement with Poly Exchange  . JOINT REPLACEMENT     left knee 5/12, right TKA 10/12     Social History   Tobacco Use  . Smoking status: Former Smoker    Packs/day: 0.25    Types: Cigarettes    Quit date: 11/16/1963    Years since quitting: 56.1  . Smokeless tobacco: Never Used  Substance Use Topics  . Alcohol use: Yes    Alcohol/week: 2.0 standard drinks    Types: 2 Cans of beer per week  . Drug use: No    ALLERGIES:  is allergic to niacin and related.  MEDICATIONS:  Current Outpatient Medications  Medication Sig Dispense Refill  . aspirin 325 MG tablet Take 325 mg by mouth daily.     [name] atorvastatin (LIPITOR) 40 MG tablet Take 40 mg by mouth every morning.    . baclofen (LIORESAL) 10 MG tablet TAKE 1/2 TABLET BY MOUTH QID AS NEEDED  0  . chlorthalidone (HYGROTON) 25 MG tablet TK 1 T PO EVERY MORNING  3  . Cholecalciferol (VITAMIN D-3) 1000 UNITS CAPS Take 1,000 Units by mouth daily.    . hydroxyurea (HYDREA) 500 MG capsule TAKE 2 CAPSULES(1000 MG) BY MOUTH DAILY WITH FOOD 180 capsule 3  . Multiple Vitamin (MULITIVITAMIN WITH MINERALS) TABS Take 2 tablets by mouth daily.     [name]  Omega-3 Fatty Acids (SEA-OMEGA 30 PO) Take 1 capsule by mouth daily.    [name] oxyCODONE (OXY IR/ROXICODONE) 5 MG immediate release tablet TK 1.5 TS PO Q 6 H PRN  0   No current facility-administered medications for this visit.    PHYSICAL EXAMINATION: ECOG PERFORMANCE STATUS: 1 - Symptomatic but completely ambulatory  Vitals:   12/11/19 0905  BP: (!) 146/90  Pulse: 64  Resp: 18  Temp: 98.1 F (36.7 C)  SpO2: 98%    Filed Weights   12/11/19 0905  Weight: 215 lb 8 oz (97.8 kg)   .Body mass index is 30.92 kg/m.   GENERAL:alert, in no acute distress and comfortable SKIN: no acute rashes, no significant lesions EYES: conjunctiva are pink and  non-injected, sclera anicteric OROPHARYNX: MMM, no exudates, no oropharyngeal erythema or ulceration NECK: supple, no JVD LYMPH:  no palpable lymphadenopathy in the cervical, axillary or inguinal regions LUNGS: clear to auscultation b/l with normal respiratory effort HEART: regular rate & rhythm ABDOMEN:  normoactive bowel sounds , non tender, not distended. No palpable hepatosplenomegaly.  Extremity: no pedal edema PSYCH: alert & oriented x 3 with fluent speech NEURO: no focal motor/sensory deficits  LABORATORY DATA:   I have reviewed the data as listed  . CBC Latest Ref Rng & Units 12/11/2019 09/10/2019 06/11/2019  WBC 4.0 - 10.5 K/uL 6.1 7.9 7.9  Hemoglobin 13.0 - 17.0 g/dL 15.5 15.5 14.9  Hematocrit 39.0 - 52.0 % 43.7 43.6 43.0  Platelets 150 - 400 K/uL 328 371 356    . CMP Latest Ref Rng & Units 12/11/2019 09/10/2019 06/11/2019  Glucose 70 - 99 mg/dL 93 91 78  BUN 8 - 23 mg/dL 21 26(H) 22  Creatinine 0.61 - 1.24 mg/dL 0.87 1.22 1.06  Sodium 135 - 145 mmol/L 141 140 141  Potassium 3.5 - 5.1 mmol/L 4.3 4.0 3.6  Chloride 98 - 111 mmol/L 101 101 103  CO2 22 - 32 mmol/L 30 29 29   Calcium 8.9 - 10.3 mg/dL 8.9 8.9 9.3  Total Protein 6.5 - 8.1 g/dL 7.3 7.2 7.2  Total Bilirubin 0.3 - 1.2 mg/dL 0.8 0.7 0.8  Alkaline Phos 38 - 126 U/L 64 73 62  AST 15 - 41 U/L 29 30 32  ALT 0 - 44 U/L 26 33 29    RADIOGRAPHIC STUDIES: I have personally reviewed the radiological images as listed and agreed with the findings in the report.  [place] abd 03/15/2017: IMPRESSION: 1. Liver is echogenic consistent with fatty infiltration and/or hepatocellular disease. No focal hepatic abnormality. No gallstones or biliary distention.  2. Spleen is enlarged at 14.4 cm with a volume of 620 cc.  Electronically Signed   By: Marcello Moores  Register   On: 03/15/2017 09:15   ASSESSMENT & PLAN: '  74 y.o.  Caucasian male with   #1  Jak2 positive Essential Thrombocytosis.   PLAN: -Discussed pt labwork today,  12/11/19; blood counts are stable, blood chemistries are normal, LDH is stable at 221  -Recommended pt receive COVID19 vaccine when available -Recommended avoiding excessive sun exposure as hydroxyurea can cause skin sensitivity -The pt has no prohibitive toxicities from continuing 1000mg  Hydroxyurea daily at this time. -Continue ASA -Will see back in 3 months with labs  #2 Abnormal LFTs - AST and ALT have normalized on labs today. -will continue to monitor.  FOLLOW UP: RTC with Dr Irene Limbo with labs in 3 months   The total time spent in the appt was 20 minutes and more than  50% was on counseling and direct patient cares.  All of the patient's questions were answered with apparent satisfaction. The patient knows to call the clinic with any problems, questions or concerns.   Sullivan Lone MD [place] AAHIVMS [facility] Hematology/Oncology Physician [facility]  (Office):       [phone] (Work cell):  [phone] (Fax):           [phone]   I, Yevette Edwards, am acting as a scribe for Dr. Sullivan Lone.   .I have reviewed the above documentation for accuracy and completeness, and I agree with the above. Brunetta Genera MD

## 2019-12-11 ENCOUNTER — Inpatient Hospital Stay (HOSPITAL_BASED_OUTPATIENT_CLINIC_OR_DEPARTMENT_OTHER): Payer: Medicare Other | Admitting: Hematology

## 2019-12-11 ENCOUNTER — Other Ambulatory Visit: Payer: Self-pay

## 2019-12-11 ENCOUNTER — Inpatient Hospital Stay: Payer: Medicare Other | Attending: Hematology

## 2019-12-11 VITALS — BP 146/90 | HR 64 | Temp 98.1°F | Resp 18 | Ht 70.0 in | Wt 215.5 lb

## 2019-12-11 DIAGNOSIS — D473 Essential (hemorrhagic) thrombocythemia: Secondary | ICD-10-CM | POA: Diagnosis not present

## 2019-12-11 DIAGNOSIS — M1990 Unspecified osteoarthritis, unspecified site: Secondary | ICD-10-CM | POA: Insufficient documentation

## 2019-12-11 DIAGNOSIS — Z87891 Personal history of nicotine dependence: Secondary | ICD-10-CM | POA: Diagnosis not present

## 2019-12-11 DIAGNOSIS — Z79899 Other long term (current) drug therapy: Secondary | ICD-10-CM | POA: Insufficient documentation

## 2019-12-11 DIAGNOSIS — I10 Essential (primary) hypertension: Secondary | ICD-10-CM | POA: Diagnosis not present

## 2019-12-11 LAB — CMP (CANCER CENTER ONLY)
ALT: 26 U/L (ref 0–44)
AST: 29 U/L (ref 15–41)
Albumin: 4.2 g/dL (ref 3.5–5.0)
Alkaline Phosphatase: 64 U/L (ref 38–126)
Anion gap: 10 (ref 5–15)
BUN: 21 mg/dL (ref 8–23)
CO2: 30 mmol/L (ref 22–32)
Calcium: 8.9 mg/dL (ref 8.9–10.3)
Chloride: 101 mmol/L (ref 98–111)
Creatinine: 0.87 mg/dL (ref 0.61–1.24)
GFR, Est AFR Am: >60 mL/min (ref >60)
GFR, Estimated: >60 mL/min (ref >60)
Glucose, Bld: 93 mg/dL (ref 70–99)
Potassium: 4.3 mmol/L (ref 3.5–5.1)
Sodium: 141 mmol/L (ref 135–145)
Total Bilirubin: 0.8 mg/dL (ref 0.3–1.2)
Total Protein: 7.3 g/dL (ref 6.5–8.1)

## 2019-12-11 LAB — CBC WITH DIFFERENTIAL/PLATELET
Abs Immature Granulocytes: 0.01 10*3/uL (ref 0.00–0.07)
Basophils Absolute: 0 10*3/uL (ref 0.0–0.1)
Basophils Relative: 1 %
Eosinophils Absolute: 0.2 10*3/uL (ref 0.0–0.5)
Eosinophils Relative: 3 %
HCT: 43.7 % (ref 39.0–52.0)
Hemoglobin: 15.5 g/dL (ref 13.0–17.0)
Immature Granulocytes: 0 %
Lymphocytes Relative: 18 %
Lymphs Abs: 1.1 10*3/uL (ref 0.7–4.0)
MCH: 37.2 pg — ABNORMAL HIGH (ref 26.0–34.0)
MCHC: 35.5 g/dL (ref 30.0–36.0)
MCV: 104.8 fL — ABNORMAL HIGH (ref 80.0–100.0)
Monocytes Absolute: 0.8 10*3/uL (ref 0.1–1.0)
Monocytes Relative: 13 %
Neutro Abs: 3.9 10*3/uL (ref 1.7–7.7)
Neutrophils Relative %: 65 %
Platelets: 328 10*3/uL (ref 150–400)
RBC: 4.17 MIL/uL — ABNORMAL LOW (ref 4.22–5.81)
RDW: 12.9 % (ref 11.5–15.5)
WBC: 6.1 10*3/uL (ref 4.0–10.5)
nRBC: 0 % (ref 0.0–0.2)

## 2019-12-11 LAB — LACTATE DEHYDROGENASE: LDH: 221 U/L — ABNORMAL HIGH (ref 98–192)

## 2019-12-18 DIAGNOSIS — M150 Primary generalized (osteo)arthritis: Secondary | ICD-10-CM | POA: Diagnosis not present

## 2019-12-18 DIAGNOSIS — M62830 Muscle spasm of back: Secondary | ICD-10-CM | POA: Diagnosis not present

## 2019-12-18 DIAGNOSIS — M47817 Spondylosis without myelopathy or radiculopathy, lumbosacral region: Secondary | ICD-10-CM | POA: Diagnosis not present

## 2019-12-18 DIAGNOSIS — G894 Chronic pain syndrome: Secondary | ICD-10-CM | POA: Diagnosis not present

## 2020-02-06 ENCOUNTER — Telehealth: Payer: Self-pay | Admitting: Hematology

## 2020-02-06 NOTE — Telephone Encounter (Signed)
Called patient to move to his appts per kale new template.  Was going to schedule his appt for the afternoon, but patient stated he could not do an afternoon appt due to the medicine he has to take.  Moved patient appt to 9:00 and patient wanted to keep his labs for 8:00.  Patient is aware of his new appt for kale at 9:00 on 4/27.

## 2020-02-12 DIAGNOSIS — M150 Primary generalized (osteo)arthritis: Secondary | ICD-10-CM | POA: Diagnosis not present

## 2020-02-12 DIAGNOSIS — M62830 Muscle spasm of back: Secondary | ICD-10-CM | POA: Diagnosis not present

## 2020-02-12 DIAGNOSIS — G894 Chronic pain syndrome: Secondary | ICD-10-CM | POA: Diagnosis not present

## 2020-02-12 DIAGNOSIS — M47817 Spondylosis without myelopathy or radiculopathy, lumbosacral region: Secondary | ICD-10-CM | POA: Diagnosis not present

## 2020-03-11 ENCOUNTER — Other Ambulatory Visit: Payer: Self-pay

## 2020-03-11 ENCOUNTER — Inpatient Hospital Stay: Payer: Medicare Other | Attending: Hematology | Admitting: Hematology

## 2020-03-11 ENCOUNTER — Inpatient Hospital Stay: Payer: Medicare Other

## 2020-03-11 ENCOUNTER — Ambulatory Visit: Payer: Medicare Other | Admitting: Hematology

## 2020-03-11 ENCOUNTER — Other Ambulatory Visit: Payer: Medicare Other

## 2020-03-11 ENCOUNTER — Telehealth: Payer: Self-pay | Admitting: Hematology

## 2020-03-11 VITALS — BP 140/68 | HR 60 | Temp 94.0°F | Resp 18 | Ht 70.0 in | Wt 218.4 lb

## 2020-03-11 DIAGNOSIS — Z87891 Personal history of nicotine dependence: Secondary | ICD-10-CM | POA: Diagnosis not present

## 2020-03-11 DIAGNOSIS — D473 Essential (hemorrhagic) thrombocythemia: Secondary | ICD-10-CM | POA: Insufficient documentation

## 2020-03-11 DIAGNOSIS — R945 Abnormal results of liver function studies: Secondary | ICD-10-CM | POA: Insufficient documentation

## 2020-03-11 DIAGNOSIS — R161 Splenomegaly, not elsewhere classified: Secondary | ICD-10-CM | POA: Insufficient documentation

## 2020-03-11 DIAGNOSIS — M1990 Unspecified osteoarthritis, unspecified site: Secondary | ICD-10-CM | POA: Diagnosis not present

## 2020-03-11 DIAGNOSIS — Z79899 Other long term (current) drug therapy: Secondary | ICD-10-CM | POA: Diagnosis not present

## 2020-03-11 LAB — CBC WITH DIFFERENTIAL/PLATELET
Abs Immature Granulocytes: 0.03 10*3/uL (ref 0.00–0.07)
Basophils Absolute: 0.1 10*3/uL (ref 0.0–0.1)
Basophils Relative: 1 %
Eosinophils Absolute: 0.1 10*3/uL (ref 0.0–0.5)
Eosinophils Relative: 2 %
HCT: 44.9 % (ref 39.0–52.0)
Hemoglobin: 15.8 g/dL (ref 13.0–17.0)
Immature Granulocytes: 1 %
Lymphocytes Relative: 18 %
Lymphs Abs: 1.1 10*3/uL (ref 0.7–4.0)
MCH: 37.6 pg — ABNORMAL HIGH (ref 26.0–34.0)
MCHC: 35.2 g/dL (ref 30.0–36.0)
MCV: 106.9 fL — ABNORMAL HIGH (ref 80.0–100.0)
Monocytes Absolute: 0.9 10*3/uL (ref 0.1–1.0)
Monocytes Relative: 15 %
Neutro Abs: 3.8 10*3/uL (ref 1.7–7.7)
Neutrophils Relative %: 63 %
Platelets: 320 10*3/uL (ref 150–400)
RBC: 4.2 MIL/uL — ABNORMAL LOW (ref 4.22–5.81)
RDW: 13.6 % (ref 11.5–15.5)
WBC: 6 10*3/uL (ref 4.0–10.5)
nRBC: 0 % (ref 0.0–0.2)

## 2020-03-11 LAB — CMP (CANCER CENTER ONLY)
ALT: 32 U/L (ref 0–44)
AST: 30 U/L (ref 15–41)
Albumin: 4.1 g/dL (ref 3.5–5.0)
Alkaline Phosphatase: 68 U/L (ref 38–126)
Anion gap: 11 (ref 5–15)
BUN: 20 mg/dL (ref 8–23)
CO2: 29 mmol/L (ref 22–32)
Calcium: 9 mg/dL (ref 8.9–10.3)
Chloride: 103 mmol/L (ref 98–111)
Creatinine: 1 mg/dL (ref 0.61–1.24)
GFR, Est AFR Am: >60 mL/min (ref >60)
GFR, Estimated: >60 mL/min (ref >60)
Glucose, Bld: 76 mg/dL (ref 70–99)
Potassium: 4.8 mmol/L (ref 3.5–5.1)
Sodium: 143 mmol/L (ref 135–145)
Total Bilirubin: 0.8 mg/dL (ref 0.3–1.2)
Total Protein: 7.4 g/dL (ref 6.5–8.1)

## 2020-03-11 NOTE — Telephone Encounter (Signed)
Scheduled per los. Gave avs and calendar  

## 2020-03-11 NOTE — Progress Notes (Signed)
HEMATOLOGY ONCOLOGY PROGRESS NOTE  Date of service:  03/11/20    Patient Care Team: [name], MD as PCP - General (Internal Medicine)  CC F/u for mx of Essential thrombocythemia  Diagnosis:  JAK2 Essential thrombocytosis   Current Treatment: Hydroxyurea 1000 mg daily  INTERVAL HISTORY:  Jordan Vazquez is here to follow up on his Jak2 positive essential thrombocytosis. The patient's last visit with [place] was on 12/11/2019. The pt reports that he is doing well overall.  The pt reports that he has been well and has no new concerns. He has had both doses of the COVID19 vaccine and tolerated them well. Pt has no issues taking Hydroxyurea at this time. Pt takes a Vitamin D supplement regularly.   Lab results today (03/11/20) of CBC w/diff and CMP is as follows: all values are WNL except for RBC at 4.20, MCV at 106.9, MCH at 37.6.  On review of systems, pt denies abdominal pain, diarrhea, mouth sores, dental issues, new lumps/bumps, leg swelling and any other symptoms.    REVIEW OF SYSTEMS:   A 10+ POINT REVIEW OF SYSTEMS WAS OBTAINED including neurology, dermatology, psychiatry, cardiac, respiratory, lymph, extremities, GI, GU, Musculoskeletal, constitutional, breasts, reproductive, HEENT.  All pertinent positives are noted in the HPI.  All others are negative.   Past Medical History:  Diagnosis Date  . 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   . Hypertension     Past Surgical History:  Procedure Laterality Date  . CARDIAC CATHETERIZATION     16 years ago negative   . HERNIA REPAIR     right inguinal hernia surgery   . I & D KNEE WITH POLY EXCHANGE  12/06/2011   Procedure: IRRIGATION AND DEBRIDEMENT KNEE WITH POLY EXCHANGE;  Surgeon: Mauri Pole, MD;  Location: [facility];  Service: Orthopedics;  Laterality: Right;  Right Total Knee Irrigation and  Debridement with Poly Exchange  . JOINT REPLACEMENT     left knee 5/12, right TKA 10/12     Social History   Tobacco Use  . Smoking status: Former Smoker    Packs/day: 0.25    Types: Cigarettes    Quit date: 11/16/1963    Years since quitting: 56.3  . Smokeless tobacco: Never Used  Substance Use Topics  . Alcohol use: Yes    Alcohol/week: 2.0 standard drinks    Types: 2 Cans of beer per week  . Drug use: No    ALLERGIES:  is allergic to niacin and related.  MEDICATIONS:  Current Outpatient Medications  Medication Sig Dispense Refill  . aspirin 325 MG tablet Take 325 mg by mouth daily.     [name] atorvastatin (LIPITOR) 40 MG tablet Take 40 mg by mouth every morning.    . baclofen (LIORESAL) 10 MG tablet TAKE 1/2 TABLET BY MOUTH QID AS NEEDED  0  . chlorthalidone (HYGROTON) 25 MG tablet TK 1 T PO EVERY MORNING  3  . Cholecalciferol (VITAMIN D-3) 1000 UNITS CAPS Take 1,000 Units by mouth daily.    . hydroxyurea (HYDREA) 500 MG capsule TAKE 2 CAPSULES(1000 MG) BY MOUTH DAILY WITH FOOD 180 capsule 3  . Multiple Vitamin (MULITIVITAMIN WITH MINERALS) TABS Take 2 tablets by mouth daily.     . Omega-3 Fatty Acids (SEA-OMEGA 30 PO) Take 1 capsule by mouth daily.    [name] oxyCODONE (OXY IR/ROXICODONE) 5 MG immediate release  tablet TK 1.5 TS PO Q 6 H PRN  0   No current facility-administered medications for this visit.    PHYSICAL EXAMINATION: ECOG PERFORMANCE STATUS: 1 - Symptomatic but completely ambulatory  Vitals:   03/11/20 0830  BP: 140/68  Pulse: 60  Resp: 18  Temp: (!) 94 F (34.4 C)  SpO2: 100%    Filed Weights   03/11/20 0830  Weight: 218 lb 6.4 oz (99.1 kg)   .Body mass index is 31.34 kg/m.   GENERAL:alert, in no acute distress and comfortable SKIN: no acute rashes, no significant lesions EYES: conjunctiva are pink and non-injected, sclera anicteric OROPHARYNX: MMM, no exudates, no oropharyngeal erythema or ulceration NECK: supple, no JVD LYMPH:  no palpable  lymphadenopathy in the cervical, axillary or inguinal regions LUNGS: clear to auscultation b/l with normal respiratory effort HEART: regular rate & rhythm ABDOMEN:  normoactive bowel sounds , non tender, not distended. No palpable hepatosplenomegaly.  Extremity: no pedal edema PSYCH: alert & oriented x 3 with fluent speech NEURO: no focal motor/sensory deficits  LABORATORY DATA:   I have reviewed the data as listed  . CBC Latest Ref Rng & Units 03/11/2020 12/11/2019 09/10/2019  WBC 4.0 - 10.5 K/uL 6.0 6.1 7.9  Hemoglobin 13.0 - 17.0 g/dL 15.8 15.5 15.5  Hematocrit 39.0 - 52.0 % 44.9 43.7 43.6  Platelets 150 - 400 K/uL 320 328 371    . CMP Latest Ref Rng & Units 03/11/2020 12/11/2019 09/10/2019  Glucose 70 - 99 mg/dL 76 93 91  BUN 8 - 23 mg/dL 20 21 26(H)  Creatinine 0.61 - 1.24 mg/dL 1.00 0.87 1.22  Sodium 135 - 145 mmol/L 143 141 140  Potassium 3.5 - 5.1 mmol/L 4.8 4.3 4.0  Chloride 98 - 111 mmol/L 103 101 101  CO2 22 - 32 mmol/L 29 30 29   Calcium 8.9 - 10.3 mg/dL 9.0 8.9 8.9  Total Protein 6.5 - 8.1 g/dL 7.4 7.3 7.2  Total Bilirubin 0.3 - 1.2 mg/dL 0.8 0.8 0.7  Alkaline Phos 38 - 126 U/L 68 64 73  AST 15 - 41 U/L 30 29 30   ALT 0 - 44 U/L 32 26 33    RADIOGRAPHIC STUDIES: I have personally reviewed the radiological images as listed and agreed with the findings in the report.  [place] abd 03/15/2017: IMPRESSION: 1. Liver is echogenic consistent with fatty infiltration and/or hepatocellular disease. No focal hepatic abnormality. No gallstones or biliary distention.  2. Spleen is enlarged at 14.4 cm with a volume of 620 cc.  Electronically Signed   By: Marcello Moores  Register   On: 03/15/2017 09:15   ASSESSMENT & PLAN: '  74 y.o.  Caucasian male with   #1  Jak2 positive Essential Thrombocytosis.   PLAN: -Discussed pt labwork today, 03/11/20; blood counts and chemistries look good. PLT well-controlled at 320K. -The pt has no prohibitive toxicities from continuing 1000mg   Hydroxyurea daily at this time. -Advised pt again on sun protection strategies due to increase in skin sensitivity caused by Hydroxyurea. -Continue ASA -Will see back in 3 months with labs   #2 Abnormal LFTs - AST and ALT have normalized on labs today. -will continue to monitor.  FOLLOW UP: RTC with Dr Irene Limbo with labs in 3 months   The total time spent in the appt was 20 minutes and more than 50% was on counseling and direct patient cares.  All of the patient's questions were answered with apparent satisfaction. The patient knows to call the clinic with any  problems, questions or concerns.   Sullivan Lone MD [place] AAHIVMS [facility] Hematology/Oncology Physician [facility]  (Office):       [phone] (Work cell):  [phone] (Fax):           [phone]   I, Yevette Edwards, am acting as a scribe for Dr. Sullivan Lone.   .I have reviewed the above documentation for accuracy and completeness, and I agree with the above. Brunetta Genera MD

## 2020-03-12 DIAGNOSIS — I10 Essential (primary) hypertension: Secondary | ICD-10-CM | POA: Diagnosis not present

## 2020-04-08 DIAGNOSIS — M150 Primary generalized (osteo)arthritis: Secondary | ICD-10-CM | POA: Diagnosis not present

## 2020-04-08 DIAGNOSIS — M62830 Muscle spasm of back: Secondary | ICD-10-CM | POA: Diagnosis not present

## 2020-04-08 DIAGNOSIS — M47817 Spondylosis without myelopathy or radiculopathy, lumbosacral region: Secondary | ICD-10-CM | POA: Diagnosis not present

## 2020-04-08 DIAGNOSIS — G894 Chronic pain syndrome: Secondary | ICD-10-CM | POA: Diagnosis not present

## 2020-05-15 ENCOUNTER — Other Ambulatory Visit: Payer: Self-pay | Admitting: Hematology

## 2020-05-15 NOTE — Telephone Encounter (Signed)
Please review for refill.  

## 2020-06-03 DIAGNOSIS — M150 Primary generalized (osteo)arthritis: Secondary | ICD-10-CM | POA: Diagnosis not present

## 2020-06-03 DIAGNOSIS — M47817 Spondylosis without myelopathy or radiculopathy, lumbosacral region: Secondary | ICD-10-CM | POA: Diagnosis not present

## 2020-06-03 DIAGNOSIS — M62830 Muscle spasm of back: Secondary | ICD-10-CM | POA: Diagnosis not present

## 2020-06-03 DIAGNOSIS — G894 Chronic pain syndrome: Secondary | ICD-10-CM | POA: Diagnosis not present

## 2020-06-04 NOTE — Progress Notes (Signed)
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 Boutelle is here to follow up on his Jak2 positive essential thrombocytosis. The patient's last visit with [place] was on 03/11/2020. The pt reports that he is doing well overall.  The pt reports that he has had no new symptoms related to his essential thrombocytosis or Hydroxyurea usage. He was given 40 mg Lisinopril to take daily to control his blood pressure. Pt experiences dizziness occasionally. This usually occurs when he is bending over or standing up quickly. He notes that his systolic blood pressure has gone as low as the 90s.   Lab results today (06/09/20) of CBC w/diff and CMP is as follows: all values are WNL except for RBC at 3.71, MCV at 109.2, MCH at 38.0, PLT at 411K, Abs Immature Granulocytes at 0.09K. 06/09/2020 Reticulocytes is as follows: Retic Ct Pct at 1.9, Retic Ct Abs at 70.1, Immature Retic Fract at 17.0  On review of systems, pt reports dizziness and denies nausea, vomiting, diarrhea, new bone pain, leg swelling, abdominal pain, testicular pain/swelling, calf pain and any other symptoms.    REVIEW OF SYSTEMS:   A 10+ POINT REVIEW OF SYSTEMS WAS OBTAINED including neurology, dermatology, psychiatry, cardiac, respiratory, lymph, extremities, GI, GU, Musculoskeletal, constitutional, breasts, reproductive, HEENT.  All pertinent positives are noted in the HPI.  All others are negative.   Past Medical History:  Diagnosis Date  . 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   . Hypertension     Past Surgical History:  Procedure Laterality Date  .  CARDIAC CATHETERIZATION     16 years ago negative   . HERNIA REPAIR     right inguinal hernia surgery   . I & D KNEE WITH POLY EXCHANGE  12/06/2011   Procedure: IRRIGATION AND DEBRIDEMENT KNEE WITH POLY EXCHANGE;  Surgeon: Mauri Pole, MD;  Location: [facility];  Service: Orthopedics;  Laterality: Right;  Right Total Knee Irrigation and Debridement with Poly Exchange  . JOINT REPLACEMENT     left knee 5/12, right TKA 10/12     Social History   Tobacco Use  . Smoking status: Former Smoker    Packs/day: 0.25    Types: Cigarettes    Quit date: 11/16/1963    Years since quitting: 56.6  . Smokeless tobacco: Never Used  Vaping Use  . Vaping Use: Never used  Substance Use Topics  . Alcohol use: Yes    Alcohol/week: 2.0 standard drinks    Types: 2 Cans of beer per week  . Drug use: No    ALLERGIES:  is allergic to niacin and related.  MEDICATIONS:  Current Outpatient Medications  Medication Sig Dispense Refill  . aspirin 325 MG tablet Take 325 mg by mouth daily.     [name] atorvastatin (LIPITOR) 40 MG tablet Take 40 mg by mouth every morning.    . baclofen (LIORESAL) 10 MG tablet TAKE 1/2 TABLET BY MOUTH QID AS NEEDED  0  . chlorthalidone (HYGROTON) 25 MG tablet TK 1 T PO EVERY MORNING  3  . Cholecalciferol (VITAMIN D-3) 1000 UNITS CAPS Take 1,000 Units by mouth daily.    [name]  hydroxyurea (HYDREA) 500 MG capsule TAKE 2 CAPSULES(1000 MG) BY MOUTH DAILY WITH FOOD 180 capsule 1  . Multiple Vitamin (MULITIVITAMIN WITH MINERALS) TABS Take 2 tablets by mouth daily.     . Omega-3 Fatty Acids (SEA-OMEGA 30 PO) Take 1 capsule by mouth daily.    [name] oxyCODONE (OXY IR/ROXICODONE) 5 MG immediate release tablet TK 1.5 TS PO Q 6 H PRN  0   No current facility-administered medications for this visit.    PHYSICAL EXAMINATION: ECOG PERFORMANCE STATUS: 1 - Symptomatic but completely ambulatory  Vitals:   06/09/20 0901  BP: (!) 125/64  Pulse: 75  Resp: 18  Temp: 97.9 F (36.6 C)  SpO2: 97%    Filed  Weights   06/09/20 0901  Weight: (!) 228 lb 12.8 oz (103.8 kg)   .Body mass index is 32.83 kg/m.   GENERAL:alert, in no acute distress and comfortable SKIN: no acute rashes, no significant lesions EYES: conjunctiva are pink and non-injected, sclera anicteric OROPHARYNX: MMM, no exudates, no oropharyngeal erythema or ulceration NECK: supple, no JVD LYMPH:  no palpable lymphadenopathy in the cervical, axillary or inguinal regions LUNGS: clear to auscultation b/l with normal respiratory effort HEART: regular rate & rhythm ABDOMEN:  normoactive bowel sounds , non tender, not distended. No palpable hepatosplenomegaly.  Extremity: trace pedal edema PSYCH: alert & oriented x 3 with fluent speech NEURO: no focal motor/sensory deficits  LABORATORY DATA:   I have reviewed the data as listed  . CBC Latest Ref Rng & Units 06/09/2020 03/11/2020 12/11/2019  WBC 4.0 - 10.5 K/uL 7.3 6.0 6.1  Hemoglobin 13.0 - 17.0 g/dL 14.1 15.8 15.5  Hematocrit 39 - 52 % 40.5 44.9 43.7  Platelets 150 - 400 K/uL 411(H) 320 328    . CMP Latest Ref Rng & Units 06/09/2020 03/11/2020 12/11/2019  Glucose 70 - 99 mg/dL 87 76 93  BUN 8 - 23 mg/dL 20 20 21   Creatinine 0.61 - 1.24 mg/dL 1.06 1.00 0.87  Sodium 135 - 145 mmol/L 138 143 141  Potassium 3.5 - 5.1 mmol/L 5.0 4.8 4.3  Chloride 98 - 111 mmol/L 103 103 101  CO2 22 - 32 mmol/L 25 29 30   Calcium 8.9 - 10.3 mg/dL 9.6 9.0 8.9  Total Protein 6.5 - 8.1 g/dL 7.4 7.4 7.3  Total Bilirubin 0.3 - 1.2 mg/dL 0.8 0.8 0.8  Alkaline Phos 38 - 126 U/L 70 68 64  AST 15 - 41 U/L 34 30 29  ALT 0 - 44 U/L 41 32 26    RADIOGRAPHIC STUDIES: I have personally reviewed the radiological images as listed and agreed with the findings in the report.  [place] abd 03/15/2017: IMPRESSION: 1. Liver is echogenic consistent with fatty infiltration and/or hepatocellular disease. No focal hepatic abnormality. No gallstones or biliary distention.  2. Spleen is enlarged at 14.4 cm with a  volume of 620 cc.  Electronically Signed   By: Marcello Moores  Register   On: 03/15/2017 09:15   ASSESSMENT & PLAN: '  74 y.o.  Caucasian male with   #1  Jak2 positive Essential Thrombocytosis.   PLAN: -Discussed pt labwork today, 06/09/20; Hgb is trending down, PLT are borderline elevated, WBC are nml, blood counts are nml, Reticulocytes are steady -No new symptoms or splenomegaly on exam -The pt has no prohibitive toxicities from continuing 1000 mg Hydroxyurea daily at this time. -Advised pt that frequent dizziness and low blood pressure may be an indicator that the dosage of Lisinopril is too high. Recommend pt  f/u with PCP for medication management.  -Continue ASA -Will see back in 3 months with labs    #2 Abnormal LFTs - AST and ALT have normalized on labs today. -will continue to monitor.  FOLLOW UP: RTC with Dr Irene Limbo with labs in 3 months   The total time spent in the appt was 20 minutes and more than 50% was on counseling and direct patient cares.  All of the patient's questions were answered with apparent satisfaction. The patient knows to call the clinic with any problems, questions or concerns.   Sullivan Lone MD [place] AAHIVMS [facility] Hematology/Oncology Physician [facility]  (Office):       [phone] (Work cell):  [phone] (Fax):           [phone]   I, Yevette Edwards, am acting as a scribe for Dr. Sullivan Lone.   .I have reviewed the above documentation for accuracy and completeness, and I agree with the above. Brunetta Genera MD

## 2020-06-09 ENCOUNTER — Inpatient Hospital Stay: Payer: Medicare Other | Attending: Hematology | Admitting: Hematology

## 2020-06-09 ENCOUNTER — Inpatient Hospital Stay: Payer: Medicare Other

## 2020-06-09 ENCOUNTER — Other Ambulatory Visit: Payer: Self-pay

## 2020-06-09 VITALS — BP 125/64 | HR 75 | Temp 97.9°F | Resp 18 | Ht 70.0 in | Wt 228.8 lb

## 2020-06-09 DIAGNOSIS — Z87891 Personal history of nicotine dependence: Secondary | ICD-10-CM | POA: Insufficient documentation

## 2020-06-09 DIAGNOSIS — R42 Dizziness and giddiness: Secondary | ICD-10-CM | POA: Insufficient documentation

## 2020-06-09 DIAGNOSIS — I10 Essential (primary) hypertension: Secondary | ICD-10-CM | POA: Diagnosis not present

## 2020-06-09 DIAGNOSIS — R161 Splenomegaly, not elsewhere classified: Secondary | ICD-10-CM | POA: Diagnosis not present

## 2020-06-09 DIAGNOSIS — R945 Abnormal results of liver function studies: Secondary | ICD-10-CM | POA: Insufficient documentation

## 2020-06-09 DIAGNOSIS — Z6832 Body mass index (BMI) 32.0-32.9, adult: Secondary | ICD-10-CM | POA: Insufficient documentation

## 2020-06-09 DIAGNOSIS — D473 Essential (hemorrhagic) thrombocythemia: Secondary | ICD-10-CM

## 2020-06-09 DIAGNOSIS — Z79899 Other long term (current) drug therapy: Secondary | ICD-10-CM | POA: Diagnosis not present

## 2020-06-09 DIAGNOSIS — M1990 Unspecified osteoarthritis, unspecified site: Secondary | ICD-10-CM | POA: Insufficient documentation

## 2020-06-09 LAB — CBC WITH DIFFERENTIAL/PLATELET
Abs Immature Granulocytes: 0.09 10*3/uL — ABNORMAL HIGH (ref 0.00–0.07)
Basophils Absolute: 0.1 10*3/uL (ref 0.0–0.1)
Basophils Relative: 1 %
Eosinophils Absolute: 0.2 10*3/uL (ref 0.0–0.5)
Eosinophils Relative: 2 %
HCT: 40.5 % (ref 39.0–52.0)
Hemoglobin: 14.1 g/dL (ref 13.0–17.0)
Immature Granulocytes: 1 %
Lymphocytes Relative: 13 %
Lymphs Abs: 0.9 10*3/uL (ref 0.7–4.0)
MCH: 38 pg — ABNORMAL HIGH (ref 26.0–34.0)
MCHC: 34.8 g/dL (ref 30.0–36.0)
MCV: 109.2 fL — ABNORMAL HIGH (ref 80.0–100.0)
Monocytes Absolute: 0.9 10*3/uL (ref 0.1–1.0)
Monocytes Relative: 12 %
Neutro Abs: 5.2 10*3/uL (ref 1.7–7.7)
Neutrophils Relative %: 71 %
Platelets: 411 10*3/uL — ABNORMAL HIGH (ref 150–400)
RBC: 3.71 MIL/uL — ABNORMAL LOW (ref 4.22–5.81)
RDW: 13.5 % (ref 11.5–15.5)
WBC: 7.3 10*3/uL (ref 4.0–10.5)
nRBC: 0 % (ref 0.0–0.2)

## 2020-06-09 LAB — RETICULOCYTES
Immature Retic Fract: 17 % — ABNORMAL HIGH (ref 2.3–15.9)
RBC.: 3.73 MIL/uL — ABNORMAL LOW (ref 4.22–5.81)
Retic Count, Absolute: 70.1 10*3/uL (ref 19.0–186.0)
Retic Ct Pct: 1.9 % (ref 0.4–3.1)

## 2020-06-09 LAB — CMP (CANCER CENTER ONLY)
ALT: 41 U/L (ref 0–44)
AST: 34 U/L (ref 15–41)
Albumin: 4 g/dL (ref 3.5–5.0)
Alkaline Phosphatase: 70 U/L (ref 38–126)
Anion gap: 10 (ref 5–15)
BUN: 20 mg/dL (ref 8–23)
CO2: 25 mmol/L (ref 22–32)
Calcium: 9.6 mg/dL (ref 8.9–10.3)
Chloride: 103 mmol/L (ref 98–111)
Creatinine: 1.06 mg/dL (ref 0.61–1.24)
GFR, Est AFR Am: >60 mL/min (ref >60)
GFR, Estimated: >60 mL/min (ref >60)
Glucose, Bld: 87 mg/dL (ref 70–99)
Potassium: 5 mmol/L (ref 3.5–5.1)
Sodium: 138 mmol/L (ref 135–145)
Total Bilirubin: 0.8 mg/dL (ref 0.3–1.2)
Total Protein: 7.4 g/dL (ref 6.5–8.1)

## 2020-06-10 ENCOUNTER — Telehealth: Payer: Self-pay | Admitting: Hematology

## 2020-06-10 NOTE — Telephone Encounter (Signed)
Scheduled per 07/26 los, patient has been called and voicemail was left. 

## 2020-07-22 DIAGNOSIS — M150 Primary generalized (osteo)arthritis: Secondary | ICD-10-CM | POA: Diagnosis not present

## 2020-07-22 DIAGNOSIS — M47817 Spondylosis without myelopathy or radiculopathy, lumbosacral region: Secondary | ICD-10-CM | POA: Diagnosis not present

## 2020-07-22 DIAGNOSIS — G894 Chronic pain syndrome: Secondary | ICD-10-CM | POA: Diagnosis not present

## 2020-07-22 DIAGNOSIS — M62830 Muscle spasm of back: Secondary | ICD-10-CM | POA: Diagnosis not present

## 2020-09-03 ENCOUNTER — Telehealth: Payer: Self-pay | Admitting: Hematology

## 2020-09-03 NOTE — Telephone Encounter (Signed)
Rescheduled 10/15 to 11/16 per provider pal, patient has been called and notified.

## 2020-09-08 ENCOUNTER — Inpatient Hospital Stay: Payer: Medicare Other

## 2020-09-08 ENCOUNTER — Inpatient Hospital Stay: Payer: Medicare Other | Admitting: Hematology

## 2020-09-08 DIAGNOSIS — M47816 Spondylosis without myelopathy or radiculopathy, lumbar region: Secondary | ICD-10-CM | POA: Diagnosis not present

## 2020-09-08 DIAGNOSIS — I10 Essential (primary) hypertension: Secondary | ICD-10-CM | POA: Diagnosis not present

## 2020-09-08 DIAGNOSIS — Z23 Encounter for immunization: Secondary | ICD-10-CM | POA: Diagnosis not present

## 2020-09-08 DIAGNOSIS — M179 Osteoarthritis of knee, unspecified: Secondary | ICD-10-CM | POA: Diagnosis not present

## 2020-09-08 DIAGNOSIS — Z1389 Encounter for screening for other disorder: Secondary | ICD-10-CM | POA: Diagnosis not present

## 2020-09-08 DIAGNOSIS — Z0000 Encounter for general adult medical examination without abnormal findings: Secondary | ICD-10-CM | POA: Diagnosis not present

## 2020-09-08 DIAGNOSIS — E7800 Pure hypercholesterolemia, unspecified: Secondary | ICD-10-CM | POA: Diagnosis not present

## 2020-09-16 DIAGNOSIS — M47817 Spondylosis without myelopathy or radiculopathy, lumbosacral region: Secondary | ICD-10-CM | POA: Diagnosis not present

## 2020-09-16 DIAGNOSIS — G894 Chronic pain syndrome: Secondary | ICD-10-CM | POA: Diagnosis not present

## 2020-09-16 DIAGNOSIS — M62830 Muscle spasm of back: Secondary | ICD-10-CM | POA: Diagnosis not present

## 2020-09-16 DIAGNOSIS — M150 Primary generalized (osteo)arthritis: Secondary | ICD-10-CM | POA: Diagnosis not present

## 2020-09-29 NOTE — Progress Notes (Signed)
HEMATOLOGY ONCOLOGY PROGRESS NOTE  Date of service:  09/30/20    Patient Care Team: [name], MD as PCP - General (Internal Medicine)  CC F/u for mx of Essential thrombocythemia  Diagnosis:  JAK2 Essential thrombocytosis   Current Treatment: Hydroxyurea 1000 mg daily  INTERVAL HISTORY:  Mr. Cuevas is here to follow up on his Jak2 positive essential thrombocytosis. The patient's last visit with [place] was on 06/09/2020. The pt reports that he is doing well overall.  The pt reports that in the interim he had an episode of syncope. Pt discussed this with Dr. Laurann Montana who changed his blood pressure medication, as he thought that the fainting was caused by low blood pressure. Pt also complains of sciatic pain and has recently discovered that he has spinal stenosis. Pt has continued 1000 mg Hydroxyurea daily and denies any new issues.   Lab results today (09/30/20) of CBC w/diff and CMP is as follows: all values are WNL except for RBC at 3.96, MCV at 108.1, MCH at 37.4, PLT at 453K, AST at 44, ALT at 57. 09/30/2020 LDH at 257  On review of systems, pt reports SOB, nerve pain, leg swelling and denies chest pain, calf pain and any other symptoms.   REVIEW OF SYSTEMS:   A 10+ POINT REVIEW OF SYSTEMS WAS OBTAINED including neurology, dermatology, psychiatry, cardiac, respiratory, lymph, extremities, GI, GU, Musculoskeletal, constitutional, breasts, reproductive, HEENT.  All pertinent positives are noted in the HPI.  All others are negative.   Past Medical History:  Diagnosis Date  . 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   . Hypertension     Past Surgical History:  Procedure Laterality Date  . CARDIAC CATHETERIZATION     16 years ago negative   . HERNIA REPAIR     right inguinal hernia surgery   . I & D KNEE WITH POLY EXCHANGE  12/06/2011   Procedure:  IRRIGATION AND DEBRIDEMENT KNEE WITH POLY EXCHANGE;  Surgeon: Mauri Pole, MD;  Location: [facility];  Service: Orthopedics;  Laterality: Right;  Right Total Knee Irrigation and Debridement with Poly Exchange  . JOINT REPLACEMENT     left knee 5/12, right TKA 10/12     Social History   Tobacco Use  . Smoking status: Former Smoker    Packs/day: 0.25    Types: Cigarettes    Quit date: 11/16/1963    Years since quitting: 56.9  . Smokeless tobacco: Never Used  Vaping Use  . Vaping Use: Never used  Substance Use Topics  . Alcohol use: Yes    Alcohol/week: 2.0 standard drinks    Types: 2 Cans of beer per week  . Drug use: No    ALLERGIES:  is allergic to niacin and related.  MEDICATIONS:  Current Outpatient Medications  Medication Sig Dispense Refill  . aspirin 325 MG tablet Take 325 mg by mouth daily.     [name] atorvastatin (LIPITOR) 40 MG tablet Take 40 mg by mouth every morning.    . baclofen (LIORESAL) 10 MG tablet TAKE 1/2 TABLET BY MOUTH QID AS NEEDED  0  . chlorthalidone (HYGROTON) 25 MG tablet TK 1 T PO EVERY MORNING  3  . Cholecalciferol (VITAMIN D-3) 1000 UNITS CAPS Take 1,000 Units by mouth daily.    . hydroxyurea (HYDREA) 500 MG capsule TAKE 2 CAPSULES(1000 MG) BY MOUTH DAILY WITH FOOD 180 capsule  1  . Multiple Vitamin (MULITIVITAMIN WITH MINERALS) TABS Take 2 tablets by mouth daily.     . Omega-3 Fatty Acids (SEA-OMEGA 30 PO) Take 1 capsule by mouth daily.    [name] oxyCODONE (OXY IR/ROXICODONE) 5 MG immediate release tablet TK 1.5 TS PO Q 6 H PRN  0   No current facility-administered medications for this visit.    PHYSICAL EXAMINATION: ECOG PERFORMANCE STATUS: 1 - Symptomatic but completely ambulatory  Vitals:   09/30/20 0903  BP: (!) 144/70  Pulse: 76  Resp: 18  Temp: (!) 96.4 F (35.8 C)  SpO2: 98%    Filed Weights   09/30/20 0903  Weight: 241 lb 3.2 oz (109.4 kg)   .Body mass index is 34.61 kg/m.   GENERAL:alert, in no acute distress and comfortable SKIN:  no acute rashes, no significant lesions EYES: conjunctiva are pink and non-injected, sclera anicteric OROPHARYNX: MMM, no exudates, no oropharyngeal erythema or ulceration NECK: supple, no JVD LYMPH:  no palpable lymphadenopathy in the cervical, axillary or inguinal regions LUNGS: clear to auscultation b/l with normal respiratory effort HEART: regular rate & rhythm ABDOMEN:  normoactive bowel sounds , non tender, not distended. No palpable hepatosplenomegaly.  Extremity: minimal pedal edema b/l PSYCH: alert & oriented x 3 with fluent speech NEURO: no focal motor/sensory deficits  LABORATORY DATA:   I have reviewed the data as listed  . CBC Latest Ref Rng & Units 09/30/2020 06/09/2020 03/11/2020  WBC 4.0 - 10.5 K/uL 8.0 7.3 6.0  Hemoglobin 13.0 - 17.0 g/dL 14.8 14.1 15.8  Hematocrit 39 - 52 % 42.8 40.5 44.9  Platelets 150 - 400 K/uL 453(H) 411(H) 320    . CMP Latest Ref Rng & Units 09/30/2020 06/09/2020 03/11/2020  Glucose 70 - 99 mg/dL 89 87 76  BUN 8 - 23 mg/dL 20 20 20   Creatinine 0.61 - 1.24 mg/dL 1.01 1.06 1.00  Sodium 135 - 145 mmol/L 142 138 143  Potassium 3.5 - 5.1 mmol/L 4.8 5.0 4.8  Chloride 98 - 111 mmol/L 106 103 103  CO2 22 - 32 mmol/L 27 25 29   Calcium 8.9 - 10.3 mg/dL 9.6 9.6 9.0  Total Protein 6.5 - 8.1 g/dL 7.5 7.4 7.4  Total Bilirubin 0.3 - 1.2 mg/dL 0.7 0.8 0.8  Alkaline Phos 38 - 126 U/L 77 70 68  AST 15 - 41 U/L 44(H) 34 30  ALT 0 - 44 U/L 57(H) 41 32    RADIOGRAPHIC STUDIES: I have personally reviewed the radiological images as listed and agreed with the findings in the report.  [place] abd 03/15/2017: IMPRESSION: 1. Liver is echogenic consistent with fatty infiltration and/or hepatocellular disease. No focal hepatic abnormality. No gallstones or biliary distention.  2. Spleen is enlarged at 14.4 cm with a volume of 620 cc.  Electronically Signed   By: Marcello Moores  Register   On: 03/15/2017 09:15   ASSESSMENT & PLAN: '  74 y.o.  Caucasian male with    #1  Jak2 positive Essential Thrombocytosis.   PLAN: -Discussed pt labwork today, 09/30/20; PLT are a little higher than goal, no anemia or leukopenia, liver enzymes are slightly elevated - likely from recent medication changes, LDH is stable.  -The pt has no prohibitive toxicities from continuing 1000 mg Hydroxyurea daily at this time.  -Recommended that the pt continue to eat well and drink at least 48-64 oz of water each day. -Advised pt that Baclofen and Oxycodone can contribute to low blood pressure.  -Recommend pt wear compression socks  to assist with leg swelling -Recommend pt continue 81 mg Aspirin daily -Will see back in 4 months with labs  #2 Abnormal LFTs - AST and ALT have normalized on labs today. -will continue to monitor.   FOLLOW UP: RTC with Dr Irene Limbo with labs in 4 months   The total time spent in the appt was 20 minutes and more than 50% was on counseling and direct patient cares.  All of the patient's questions were answered with apparent satisfaction. The patient knows to call the clinic with any problems, questions or concerns.   Sullivan Lone MD [place] AAHIVMS [facility] Hematology/Oncology Physician [facility]  (Office):       [phone] (Work cell):  [phone] (Fax):           [phone]   I, Yevette Edwards, am acting as a scribe for Dr. Sullivan Lone.   .I have reviewed the above documentation for accuracy and completeness, and I agree with the above. Brunetta Genera MD

## 2020-09-30 ENCOUNTER — Inpatient Hospital Stay (HOSPITAL_BASED_OUTPATIENT_CLINIC_OR_DEPARTMENT_OTHER): Payer: Medicare Other | Admitting: Hematology

## 2020-09-30 ENCOUNTER — Telehealth: Payer: Self-pay | Admitting: Hematology

## 2020-09-30 ENCOUNTER — Inpatient Hospital Stay: Payer: Medicare Other | Attending: Hematology

## 2020-09-30 ENCOUNTER — Other Ambulatory Visit: Payer: Self-pay

## 2020-09-30 VITALS — BP 144/70 | HR 76 | Temp 96.4°F | Resp 18 | Ht 70.0 in | Wt 241.2 lb

## 2020-09-30 DIAGNOSIS — R55 Syncope and collapse: Secondary | ICD-10-CM | POA: Insufficient documentation

## 2020-09-30 DIAGNOSIS — D473 Essential (hemorrhagic) thrombocythemia: Secondary | ICD-10-CM | POA: Diagnosis not present

## 2020-09-30 DIAGNOSIS — R161 Splenomegaly, not elsewhere classified: Secondary | ICD-10-CM | POA: Insufficient documentation

## 2020-09-30 DIAGNOSIS — R0602 Shortness of breath: Secondary | ICD-10-CM | POA: Insufficient documentation

## 2020-09-30 DIAGNOSIS — Z79899 Other long term (current) drug therapy: Secondary | ICD-10-CM | POA: Insufficient documentation

## 2020-09-30 DIAGNOSIS — Z87891 Personal history of nicotine dependence: Secondary | ICD-10-CM | POA: Diagnosis not present

## 2020-09-30 DIAGNOSIS — D75839 Thrombocytosis, unspecified: Secondary | ICD-10-CM | POA: Insufficient documentation

## 2020-09-30 DIAGNOSIS — R7989 Other specified abnormal findings of blood chemistry: Secondary | ICD-10-CM | POA: Diagnosis not present

## 2020-09-30 LAB — CBC WITH DIFFERENTIAL/PLATELET
Abs Immature Granulocytes: 0.05 10*3/uL (ref 0.00–0.07)
Basophils Absolute: 0.1 10*3/uL (ref 0.0–0.1)
Basophils Relative: 1 %
Eosinophils Absolute: 0.2 10*3/uL (ref 0.0–0.5)
Eosinophils Relative: 3 %
HCT: 42.8 % (ref 39.0–52.0)
Hemoglobin: 14.8 g/dL (ref 13.0–17.0)
Immature Granulocytes: 1 %
Lymphocytes Relative: 15 %
Lymphs Abs: 1.2 10*3/uL (ref 0.7–4.0)
MCH: 37.4 pg — ABNORMAL HIGH (ref 26.0–34.0)
MCHC: 34.6 g/dL (ref 30.0–36.0)
MCV: 108.1 fL — ABNORMAL HIGH (ref 80.0–100.0)
Monocytes Absolute: 0.9 10*3/uL (ref 0.1–1.0)
Monocytes Relative: 11 %
Neutro Abs: 5.6 10*3/uL (ref 1.7–7.7)
Neutrophils Relative %: 69 %
Platelets: 453 10*3/uL — ABNORMAL HIGH (ref 150–400)
RBC: 3.96 MIL/uL — ABNORMAL LOW (ref 4.22–5.81)
RDW: 13.2 % (ref 11.5–15.5)
WBC: 8 10*3/uL (ref 4.0–10.5)
nRBC: 0 % (ref 0.0–0.2)

## 2020-09-30 LAB — CMP (CANCER CENTER ONLY)
ALT: 57 U/L — ABNORMAL HIGH (ref 0–44)
AST: 44 U/L — ABNORMAL HIGH (ref 15–41)
Albumin: 4.2 g/dL (ref 3.5–5.0)
Alkaline Phosphatase: 77 U/L (ref 38–126)
Anion gap: 9 (ref 5–15)
BUN: 20 mg/dL (ref 8–23)
CO2: 27 mmol/L (ref 22–32)
Calcium: 9.6 mg/dL (ref 8.9–10.3)
Chloride: 106 mmol/L (ref 98–111)
Creatinine: 1.01 mg/dL (ref 0.61–1.24)
GFR, Estimated: >60 mL/min (ref >60)
Glucose, Bld: 89 mg/dL (ref 70–99)
Potassium: 4.8 mmol/L (ref 3.5–5.1)
Sodium: 142 mmol/L (ref 135–145)
Total Bilirubin: 0.7 mg/dL (ref 0.3–1.2)
Total Protein: 7.5 g/dL (ref 6.5–8.1)

## 2020-09-30 LAB — LACTATE DEHYDROGENASE: LDH: 257 U/L — ABNORMAL HIGH (ref 98–192)

## 2020-09-30 NOTE — Telephone Encounter (Signed)
Scheduled per 11/16 los. Printed avs and calendar for pt. Pt requested mondays only.

## 2020-10-22 DIAGNOSIS — I10 Essential (primary) hypertension: Secondary | ICD-10-CM | POA: Diagnosis not present

## 2020-10-22 DIAGNOSIS — M48062 Spinal stenosis, lumbar region with neurogenic claudication: Secondary | ICD-10-CM | POA: Diagnosis not present

## 2020-10-22 DIAGNOSIS — M47816 Spondylosis without myelopathy or radiculopathy, lumbar region: Secondary | ICD-10-CM | POA: Diagnosis not present

## 2020-11-11 DIAGNOSIS — G894 Chronic pain syndrome: Secondary | ICD-10-CM | POA: Diagnosis not present

## 2020-11-11 DIAGNOSIS — M62830 Muscle spasm of back: Secondary | ICD-10-CM | POA: Diagnosis not present

## 2020-11-11 DIAGNOSIS — M47817 Spondylosis without myelopathy or radiculopathy, lumbosacral region: Secondary | ICD-10-CM | POA: Diagnosis not present

## 2020-11-11 DIAGNOSIS — M150 Primary generalized (osteo)arthritis: Secondary | ICD-10-CM | POA: Diagnosis not present

## 2020-11-13 ENCOUNTER — Other Ambulatory Visit: Payer: Self-pay | Admitting: Hematology

## 2020-12-09 DIAGNOSIS — H5203 Hypermetropia, bilateral: Secondary | ICD-10-CM | POA: Diagnosis not present

## 2021-01-06 DIAGNOSIS — M62830 Muscle spasm of back: Secondary | ICD-10-CM | POA: Diagnosis not present

## 2021-01-06 DIAGNOSIS — G894 Chronic pain syndrome: Secondary | ICD-10-CM | POA: Diagnosis not present

## 2021-01-06 DIAGNOSIS — M150 Primary generalized (osteo)arthritis: Secondary | ICD-10-CM | POA: Diagnosis not present

## 2021-01-06 DIAGNOSIS — M47817 Spondylosis without myelopathy or radiculopathy, lumbosacral region: Secondary | ICD-10-CM | POA: Diagnosis not present

## 2021-01-25 NOTE — Progress Notes (Signed)
HEMATOLOGY ONCOLOGY PROGRESS NOTE  Date of service:  01/26/21    Patient Care Team: [name], MD as PCP - General (Internal Medicine)  CC F/u for mx of Essential thrombocythemia  Diagnosis:  JAK2 Essential thrombocytosis   Current Treatment: Hydroxyurea 1000 mg daily  INTERVAL HISTORY: Jordan Vazquez is here to follow up on his Jak2 positive essential thrombocytosis. The patient's last visit with [place] was on 09/30/2020. The pt reports that he is doing well overall.  The pt reports no new concerns. He has been experiencing lower back pain and sciatica. He has been changing his medications related to neuropathy to help with this and decreased Gabapentin dosae. He is also now on HCTZ. The pt notes he will be having cataract surgery in one of his eyes very soon. He notes no issues tolerating the Hydroxyurea.  The pt notes that his balance has been getting worse recently, but he uses a cane to walk around when needed.  Lab results today 01/26/2021 of CBC w/diff and CMP is as follows: all values are WNL except for MCV of 108.5, MCH of 36.0, Plt of 418K, Glucose of 60. 01/26/2021 Immature Plt Fract of 5.9.  On review of systems, pt denies chest pain, SOB, acute leg swelling, abdominal pain, changes in bowel habits, and any other symptoms.  REVIEW OF SYSTEMS:   10 Point review of Systems was done is negative except as noted above.  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    Hypertension     Past Surgical History:  Procedure Laterality Date   CARDIAC CATHETERIZATION     16 years ago negative    HERNIA REPAIR     right inguinal hernia surgery    I & D KNEE WITH POLY EXCHANGE  12/06/2011   Procedure: IRRIGATION AND DEBRIDEMENT KNEE WITH POLY EXCHANGE;  Surgeon: Mauri Pole, MD;  Location: [facility];  Service:  Orthopedics;  Laterality: Right;  Right Total Knee Irrigation and Debridement with Poly Exchange   JOINT REPLACEMENT     left knee 5/12, right TKA 10/12     Social History   Tobacco Use   Smoking status: Former Smoker    Packs/day: 0.25    Types: Cigarettes    Quit date: 11/16/1963    Years since quitting: 57.2   Smokeless tobacco: Never Used  Vaping Use   Vaping Use: Never used  Substance Use Topics   Alcohol use: Yes    Alcohol/week: 2.0 standard drinks    Types: 2 Cans of beer per week   Drug use: No    ALLERGIES:  is allergic to niacin and related.  MEDICATIONS:  Current Outpatient Medications  Medication Sig Dispense Refill   aspirin 325 MG tablet Take 325 mg by mouth daily.      atorvastatin (LIPITOR) 40 MG tablet Take 40 mg by mouth every morning.     baclofen (LIORESAL) 10 MG tablet TAKE 1/2 TABLET BY MOUTH QID AS NEEDED  0   Cholecalciferol (VITAMIN D-3) 1000 UNITS CAPS Take 1,000 Units by mouth daily.     DULoxetine (CYMBALTA) 30 MG capsule Take 30 mg by mouth at bedtime.     gabapentin (NEURONTIN) 300 MG capsule Take by mouth.     hydrochlorothiazide (HYDRODIURIL) 12.5 MG tablet Take 12.5 mg by mouth every morning.  hydroxyurea (HYDREA) 500 MG capsule TAKE 2 CAPSULES(1000 MG) BY MOUTH DAILY WITH FOOD 180 capsule 0   Multiple Vitamin (MULITIVITAMIN WITH MINERALS) TABS Take 2 tablets by mouth daily.      Omega-3 Fatty Acids (SEA-OMEGA 30 PO) Take 1 capsule by mouth daily.     oxyCODONE (OXY IR/ROXICODONE) 5 MG immediate release tablet TK 1.5 TS PO Q 6 H PRN  0   No current facility-administered medications for this visit.    PHYSICAL EXAMINATION: ECOG PERFORMANCE STATUS: 1 - Symptomatic but completely ambulatory  Vitals:   01/26/21 0842  BP: 120/90  Pulse: 82  Resp: 14  Temp: 98 F (36.7 C)  SpO2: 100%    Filed Weights   01/26/21 0842  Weight: 223 lb 12.8 oz (101.5 kg)   .Body mass index is 32.11 kg/m.   GENERAL:alert, in no  acute distress and comfortable SKIN: no acute rashes, no significant lesions EYES: conjunctiva are pink and non-injected, sclera anicteric OROPHARYNX: MMM, no exudates, no oropharyngeal erythema or ulceration NECK: supple, no JVD LYMPH:  no palpable lymphadenopathy in the cervical, axillary or inguinal regions LUNGS: clear to auscultation b/l with normal respiratory effort HEART: regular rate & rhythm ABDOMEN:  normoactive bowel sounds , non tender, not distended. Extremity: no pedal edema PSYCH: alert & oriented x 3 with fluent speech NEURO: no focal motor/sensory deficits   LABORATORY DATA:   I have reviewed the data as listed  . CBC Latest Ref Rng & Units 01/26/2021 09/30/2020 06/09/2020  WBC 4.0 - 10.5 K/uL 5.4 8.0 7.3  Hemoglobin 13.0 - 17.0 g/dL 15.6 14.8 14.1  Hematocrit 39.0 - 52.0 % 47.0 42.8 40.5  Platelets 150 - 400 K/uL 418(H) 453(H) 411(H)    . CMP Latest Ref Rng & Units 01/26/2021 09/30/2020 06/09/2020  Glucose 70 - 99 mg/dL 60(L) 89 87  BUN 8 - 23 mg/dL 22 20 20   Creatinine 0.61 - 1.24 mg/dL 1.13 1.01 1.06  Sodium 135 - 145 mmol/L 141 142 138  Potassium 3.5 - 5.1 mmol/L 4.7 4.8 5.0  Chloride 98 - 111 mmol/L 105 106 103  CO2 22 - 32 mmol/L 28 27 25   Calcium 8.9 - 10.3 mg/dL 9.1 9.6 9.6  Total Protein 6.5 - 8.1 g/dL 7.2 7.5 7.4  Total Bilirubin 0.3 - 1.2 mg/dL 0.6 0.7 0.8  Alkaline Phos 38 - 126 U/L 69 77 70  AST 15 - 41 U/L 30 44(H) 34  ALT 0 - 44 U/L 32 57(H) 41    RADIOGRAPHIC STUDIES: I have personally reviewed the radiological images as listed and agreed with the findings in the report.  [place] abd 03/15/2017: IMPRESSION: 1. Liver is echogenic consistent with fatty infiltration and/or hepatocellular disease. No focal hepatic abnormality. No gallstones or biliary distention.  2. Spleen is enlarged at 14.4 cm with a volume of 620 cc.  Electronically Signed   By: Marcello Moores  Register   On: 03/15/2017 09:15   ASSESSMENT & PLAN: '  75 y.o.  Caucasian male  with   #1  Jak2 positive Essential Thrombocytosis.   PLAN: -Discussed pt labwork today, 01/26/2021; Plt look stable, HCT slightly elevated, chemistries normal. [place] Plt Fract normal. -Will continue to monitor HCT, but believe this slight change is due to the addition of a diuretic. Other counts look very stable and not a concern.  -Recommended pt use walking stick or cane for changes in equilibrium and imbalance. -Recommended pt talk to schedulers regarding his appointment time preferences.  -The pt has no  prohibitive toxicities from continuing 1000 mg Hydroxyurea daily at this time.  -Recommended that the pt continue to eat well and drink at least 48-64 oz of water each day. -Continue 81 mg Aspirin daily. -Will see back in 4 months with labs.  #2 Abnormal LFTs - AST and ALT have normalized on labs today. -will continue to monitor.   FOLLOW UP: RTC with Dr Irene Limbo with labs in 4 months   The total time spent in the appointment was 20 minutes and more than 50% was on counseling and direct patient cares.   All of the patient's questions were answered with apparent satisfaction. The patient knows to call the clinic with any problems, questions or concerns.   Jordan Lone MD [place] AAHIVMS [facility] Hematology/Oncology Physician [facility]  (Office):       [phone] (Work cell):  [phone] (Fax):           [phone]   I, Jordan Vazquez, am acting as scribe for Dr. Sullivan Lone, MD.     .I have reviewed the above documentation for accuracy and completeness, and I agree with the above. Brunetta Genera MD

## 2021-01-26 ENCOUNTER — Telehealth: Payer: Self-pay | Admitting: Hematology

## 2021-01-26 ENCOUNTER — Inpatient Hospital Stay: Payer: Medicare Other

## 2021-01-26 ENCOUNTER — Inpatient Hospital Stay: Payer: Medicare Other | Attending: Hematology | Admitting: Hematology

## 2021-01-26 ENCOUNTER — Other Ambulatory Visit: Payer: Self-pay

## 2021-01-26 VITALS — BP 120/90 | HR 82 | Temp 98.0°F | Resp 14 | Ht 70.0 in | Wt 223.8 lb

## 2021-01-26 DIAGNOSIS — R161 Splenomegaly, not elsewhere classified: Secondary | ICD-10-CM | POA: Insufficient documentation

## 2021-01-26 DIAGNOSIS — D75839 Thrombocytosis, unspecified: Secondary | ICD-10-CM | POA: Diagnosis not present

## 2021-01-26 DIAGNOSIS — Z87891 Personal history of nicotine dependence: Secondary | ICD-10-CM | POA: Diagnosis not present

## 2021-01-26 DIAGNOSIS — Z79899 Other long term (current) drug therapy: Secondary | ICD-10-CM | POA: Diagnosis not present

## 2021-01-26 DIAGNOSIS — G629 Polyneuropathy, unspecified: Secondary | ICD-10-CM | POA: Insufficient documentation

## 2021-01-26 DIAGNOSIS — D473 Essential (hemorrhagic) thrombocythemia: Secondary | ICD-10-CM | POA: Insufficient documentation

## 2021-01-26 DIAGNOSIS — R7989 Other specified abnormal findings of blood chemistry: Secondary | ICD-10-CM | POA: Diagnosis not present

## 2021-01-26 LAB — CMP (CANCER CENTER ONLY)
ALT: 32 U/L (ref 0–44)
AST: 30 U/L (ref 15–41)
Albumin: 4.1 g/dL (ref 3.5–5.0)
Alkaline Phosphatase: 69 U/L (ref 38–126)
Anion gap: 8 (ref 5–15)
BUN: 22 mg/dL (ref 8–23)
CO2: 28 mmol/L (ref 22–32)
Calcium: 9.1 mg/dL (ref 8.9–10.3)
Chloride: 105 mmol/L (ref 98–111)
Creatinine: 1.13 mg/dL (ref 0.61–1.24)
GFR, Estimated: >60 mL/min (ref >60)
Glucose, Bld: 60 mg/dL — ABNORMAL LOW (ref 70–99)
Potassium: 4.7 mmol/L (ref 3.5–5.1)
Sodium: 141 mmol/L (ref 135–145)
Total Bilirubin: 0.6 mg/dL (ref 0.3–1.2)
Total Protein: 7.2 g/dL (ref 6.5–8.1)

## 2021-01-26 LAB — CBC WITH DIFFERENTIAL/PLATELET
Abs Immature Granulocytes: 0.01 10*3/uL (ref 0.00–0.07)
Basophils Absolute: 0 10*3/uL (ref 0.0–0.1)
Basophils Relative: 0 %
Eosinophils Absolute: 0.2 10*3/uL (ref 0.0–0.5)
Eosinophils Relative: 4 %
HCT: 47 % (ref 39.0–52.0)
Hemoglobin: 15.6 g/dL (ref 13.0–17.0)
Immature Granulocytes: 0 %
Lymphocytes Relative: 19 %
Lymphs Abs: 1 10*3/uL (ref 0.7–4.0)
MCH: 36 pg — ABNORMAL HIGH (ref 26.0–34.0)
MCHC: 33.2 g/dL (ref 30.0–36.0)
MCV: 108.5 fL — ABNORMAL HIGH (ref 80.0–100.0)
Monocytes Absolute: 0.9 10*3/uL (ref 0.1–1.0)
Monocytes Relative: 16 %
Neutro Abs: 3.3 10*3/uL (ref 1.7–7.7)
Neutrophils Relative %: 61 %
Platelets: 418 10*3/uL — ABNORMAL HIGH (ref 150–400)
RBC: 4.33 MIL/uL (ref 4.22–5.81)
RDW: 13.4 % (ref 11.5–15.5)
WBC: 5.4 10*3/uL (ref 4.0–10.5)
nRBC: 0 % (ref 0.0–0.2)

## 2021-01-26 LAB — IMMATURE PLATELET FRACTION: Immature Platelet Fraction: 5.9 % (ref 1.2–8.6)

## 2021-01-26 NOTE — Telephone Encounter (Signed)
Scheduled per los. Gave avs and calendar  

## 2021-01-28 DIAGNOSIS — H2513 Age-related nuclear cataract, bilateral: Secondary | ICD-10-CM | POA: Diagnosis not present

## 2021-01-28 DIAGNOSIS — H25043 Posterior subcapsular polar age-related cataract, bilateral: Secondary | ICD-10-CM | POA: Diagnosis not present

## 2021-02-10 DIAGNOSIS — H25811 Combined forms of age-related cataract, right eye: Secondary | ICD-10-CM | POA: Diagnosis not present

## 2021-02-10 DIAGNOSIS — H2511 Age-related nuclear cataract, right eye: Secondary | ICD-10-CM | POA: Diagnosis not present

## 2021-02-10 DIAGNOSIS — H25041 Posterior subcapsular polar age-related cataract, right eye: Secondary | ICD-10-CM | POA: Diagnosis not present

## 2021-03-03 DIAGNOSIS — M47817 Spondylosis without myelopathy or radiculopathy, lumbosacral region: Secondary | ICD-10-CM | POA: Diagnosis not present

## 2021-03-03 DIAGNOSIS — M150 Primary generalized (osteo)arthritis: Secondary | ICD-10-CM | POA: Diagnosis not present

## 2021-03-03 DIAGNOSIS — G894 Chronic pain syndrome: Secondary | ICD-10-CM | POA: Diagnosis not present

## 2021-03-03 DIAGNOSIS — M62830 Muscle spasm of back: Secondary | ICD-10-CM | POA: Diagnosis not present

## 2021-03-09 DIAGNOSIS — M48062 Spinal stenosis, lumbar region with neurogenic claudication: Secondary | ICD-10-CM | POA: Diagnosis not present

## 2021-03-09 DIAGNOSIS — M5432 Sciatica, left side: Secondary | ICD-10-CM | POA: Diagnosis not present

## 2021-03-09 DIAGNOSIS — M47816 Spondylosis without myelopathy or radiculopathy, lumbar region: Secondary | ICD-10-CM | POA: Diagnosis not present

## 2021-03-09 DIAGNOSIS — I10 Essential (primary) hypertension: Secondary | ICD-10-CM | POA: Diagnosis not present

## 2021-03-17 DIAGNOSIS — H25812 Combined forms of age-related cataract, left eye: Secondary | ICD-10-CM | POA: Diagnosis not present

## 2021-03-17 DIAGNOSIS — H25012 Cortical age-related cataract, left eye: Secondary | ICD-10-CM | POA: Diagnosis not present

## 2021-03-17 DIAGNOSIS — H25042 Posterior subcapsular polar age-related cataract, left eye: Secondary | ICD-10-CM | POA: Diagnosis not present

## 2021-03-17 DIAGNOSIS — H2512 Age-related nuclear cataract, left eye: Secondary | ICD-10-CM | POA: Diagnosis not present

## 2021-03-18 ENCOUNTER — Other Ambulatory Visit: Payer: Self-pay | Admitting: Hematology

## 2021-04-28 DIAGNOSIS — M47817 Spondylosis without myelopathy or radiculopathy, lumbosacral region: Secondary | ICD-10-CM | POA: Diagnosis not present

## 2021-04-28 DIAGNOSIS — M62830 Muscle spasm of back: Secondary | ICD-10-CM | POA: Diagnosis not present

## 2021-04-28 DIAGNOSIS — M150 Primary generalized (osteo)arthritis: Secondary | ICD-10-CM | POA: Diagnosis not present

## 2021-04-28 DIAGNOSIS — G894 Chronic pain syndrome: Secondary | ICD-10-CM | POA: Diagnosis not present

## 2021-05-15 NOTE — Progress Notes (Signed)
[facility] sent a letter requesting guidance on holding ASA for 1 week prior to removing a cyst from pt's left medial canthus. Per Dr Irene Limbo ok to hold ASA if plts are <400k. Pt to get labs here at [facility] on 06/01/21. If procedure prior they will need to get labs. Contacted office to give them information and left message for someone to call me back.

## 2021-05-31 NOTE — Progress Notes (Signed)
HEMATOLOGY ONCOLOGY PROGRESS NOTE  Date of service:  06/01/21    Patient Care Team: [name], MD as PCP - General (Internal Medicine)  CC F/u for mx of Essential thrombocythemia  Diagnosis:  JAK2 Essential thrombocytosis   Current Treatment: Hydroxyurea 1000 mg daily  INTERVAL HISTORY: Jordan Vazquez is here to follow up on his Jak2 positive essential thrombocytosis. The patient's last visit with [place] was on 01/26/2021. The pt reports that he is doing well overall.  The pt reports no new symptoms or concerns. He is here today to receive clearance for his cyst removal/eye surgery by Dr. Katy Apo. They wanted clearance prior due to risk of blood loss. The patient notes continued weight loss due to a healthier diet and staying active. He continues to have neuropathy in his feet that is causing some imbalance. The only medication change noted was an increase in his gabapentin dosage, which the pt notes has helped him very much. He no longer has any more knee pain. He continues to take the Hydroxyurea with no side effects and is tolerating this well.  Lab results today 06/01/2021 of CBC w/diff and CMP is as follows: all values are WNL except for RBC of 3.95, MCV of 108.6, MCH of 34.7.  On review of systems, pt reports chronic neuropathy, imbalance, weight loss and denies recent falls, bruising issues, bleeding issues, and any other symptoms.   REVIEW OF SYSTEMS:   10 Point review of Systems was done is negative except as noted above.  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    Hypertension     Past Surgical History:  Procedure Laterality Date   CARDIAC CATHETERIZATION     16 years ago negative    HERNIA REPAIR     right inguinal hernia surgery    I & D KNEE WITH POLY EXCHANGE  12/06/2011   Procedure:  IRRIGATION AND DEBRIDEMENT KNEE WITH POLY EXCHANGE;  Surgeon: Mauri Pole, MD;  Location: [facility];  Service: Orthopedics;  Laterality: Right;  Right Total Knee Irrigation and Debridement with Poly Exchange   JOINT REPLACEMENT     left knee 5/12, right TKA 10/12     Social History   Tobacco Use   Smoking status: Former    Packs/day: 0.25    Types: Cigarettes    Quit date: 11/16/1963    Years since quitting: 57.5   Smokeless tobacco: Never  Vaping Use   Vaping Use: Never used  Substance Use Topics   Alcohol use: Yes    Alcohol/week: 2.0 standard drinks    Types: 2 Cans of beer per week   Drug use: No    ALLERGIES:  is allergic to niacin and related.  MEDICATIONS:  Current Outpatient Medications  Medication Sig Dispense Refill   aspirin 325 MG tablet Take 325 mg by mouth daily.      atorvastatin (LIPITOR) 40 MG tablet Take 40 mg by mouth every morning.     baclofen (LIORESAL) 10 MG tablet TAKE 1/2 TABLET BY MOUTH QID AS NEEDED  0   Cholecalciferol (VITAMIN D-3) 1000 UNITS CAPS Take 1,000 Units by mouth daily.     gabapentin (NEURONTIN) 300 MG capsule Take by mouth.     hydrochlorothiazide (HYDRODIURIL) 12.5 MG tablet Take 12.5 mg by mouth every morning.     hydroxyurea (HYDREA)  500 MG capsule TAKE 2 CAPSULES(1000 MG) BY MOUTH DAILY WITH FOOD 180 capsule 0   Multiple Vitamin (MULITIVITAMIN WITH MINERALS) TABS Take 2 tablets by mouth daily.      Omega-3 Fatty Acids (SEA-OMEGA 30 PO) Take 1 capsule by mouth daily.     oxyCODONE (OXY IR/ROXICODONE) 5 MG immediate release tablet   0   telmisartan (MICARDIS) 40 MG tablet Take 40 mg by mouth daily.     No current facility-administered medications for this visit.    PHYSICAL EXAMINATION: ECOG PERFORMANCE STATUS: 1 - Symptomatic but completely ambulatory  Vitals:   06/01/21 0847  BP: 121/76  Pulse: 62  Resp: 18  Temp: 98.5 F (36.9 C)  SpO2: 98%     Filed Weights   06/01/21 0847  Weight: 215 lb 11.2 oz (97.8 kg)     .Body mass index is 30.95 kg/m.   NAD. GENERAL:alert, in no acute distress and comfortable SKIN: no acute rashes, no significant lesions EYES: conjunctiva are pink and non-injected, sclera anicteric OROPHARYNX: MMM, no exudates, no oropharyngeal erythema or ulceration NECK: supple, no JVD LYMPH:  no palpable lymphadenopathy in the cervical, axillary or inguinal regions LUNGS: clear to auscultation b/l with normal respiratory effort HEART: regular rate & rhythm ABDOMEN:  normoactive bowel sounds , non tender, not distended. Extremity: no pedal edema PSYCH: alert & oriented x 3 with fluent speech NEURO: no focal motor/sensory deficits   LABORATORY DATA:   I have reviewed the data as listed  . CBC Latest Ref Rng & Units 06/01/2021 01/26/2021 09/30/2020  WBC 4.0 - 10.5 K/uL 6.0 5.4 8.0  Hemoglobin 13.0 - 17.0 g/dL 14.9 15.6 14.8  Hematocrit 39.0 - 52.0 % 42.9 47.0 42.8  Platelets 150 - 400 K/uL 362 418(H) 453(H)    . CMP Latest Ref Rng & Units 06/01/2021 01/26/2021 09/30/2020  Glucose 70 - 99 mg/dL 86 60(L) 89  BUN 8 - 23 mg/dL 19 22 20   Creatinine 0.61 - 1.24 mg/dL 0.97 1.13 1.01  Sodium 135 - 145 mmol/L 141 141 142  Potassium 3.5 - 5.1 mmol/L 4.4 4.7 4.8  Chloride 98 - 111 mmol/L 106 105 106  CO2 22 - 32 mmol/L 27 28 27   Calcium 8.9 - 10.3 mg/dL 9.4 9.1 9.6  Total Protein 6.5 - 8.1 g/dL 7.4 7.2 7.5  Total Bilirubin 0.3 - 1.2 mg/dL 1.0 0.6 0.7  Alkaline Phos 38 - 126 U/L 58 69 77  AST 15 - 41 U/L 28 30 44(H)  ALT 0 - 44 U/L 27 32 57(H)    RADIOGRAPHIC STUDIES: I have personally reviewed the radiological images as listed and agreed with the findings in the report.  [place] abd 03/15/2017: IMPRESSION: 1. Liver is echogenic consistent with fatty infiltration and/or hepatocellular disease. No focal hepatic abnormality. No gallstones or biliary distention.   2. Spleen is enlarged at 14.4 cm with a volume of 620 cc.    Electronically Signed   By: Marcello Moores  Register   On:  03/15/2017 09:15   ASSESSMENT & PLAN: '  75 y.o.  Caucasian male with   #1  Jak2 positive Essential Thrombocytosis.   PLAN: -Discussed pt labwork today, 06/01/2021; blood counts stable, chemistries completely normal. -Advised pt that due to his plt normalizing, he can go off of ASA for a week for his surgery. -Advised pt that due to this surgery being more minor, we can continue Hydroxyurea and do not have to stop this. -The pt has no prohibitive toxicities from continuing 1000  mg Hydroxyurea daily at this time.  -Recommended that the pt continue to eat well and drink at least 48-64 oz of water each day. -Continue 81 mg Aspirin daily. -Will see back in 4 months with labs.   #2 Abnormal LFTs - AST and ALT have normalized on labs today. -will continue to monitor.   FOLLOW UP: RTC with Dr Irene Limbo with labs in 4 months   The total time spent in the appointment was 20 minutes and more than 50% was on counseling and direct patient cares.   All of the patient's questions were answered with apparent satisfaction. The patient knows to call the clinic with any problems, questions or concerns.   Sullivan Lone MD [place] AAHIVMS [facility] Hematology/Oncology Physician [facility]  (Office):       [phone] (Work cell):  [phone] (Fax):           [phone]   I, Reinaldo Raddle, am acting as scribe for Dr. Sullivan Lone, MD.    .I have reviewed the above documentation for accuracy and completeness, and I agree with the above. Brunetta Genera MD

## 2021-06-01 ENCOUNTER — Other Ambulatory Visit: Payer: Self-pay

## 2021-06-01 ENCOUNTER — Inpatient Hospital Stay: Payer: Medicare Other | Attending: Hematology | Admitting: Hematology

## 2021-06-01 ENCOUNTER — Inpatient Hospital Stay: Payer: Medicare Other

## 2021-06-01 VITALS — BP 121/76 | HR 62 | Temp 98.5°F | Resp 18 | Ht 70.0 in | Wt 215.7 lb

## 2021-06-01 DIAGNOSIS — R2689 Other abnormalities of gait and mobility: Secondary | ICD-10-CM | POA: Insufficient documentation

## 2021-06-01 DIAGNOSIS — R634 Abnormal weight loss: Secondary | ICD-10-CM | POA: Diagnosis not present

## 2021-06-01 DIAGNOSIS — Z79899 Other long term (current) drug therapy: Secondary | ICD-10-CM | POA: Insufficient documentation

## 2021-06-01 DIAGNOSIS — D473 Essential (hemorrhagic) thrombocythemia: Secondary | ICD-10-CM

## 2021-06-01 DIAGNOSIS — Z87891 Personal history of nicotine dependence: Secondary | ICD-10-CM | POA: Diagnosis not present

## 2021-06-01 DIAGNOSIS — R7989 Other specified abnormal findings of blood chemistry: Secondary | ICD-10-CM | POA: Insufficient documentation

## 2021-06-01 DIAGNOSIS — D75839 Thrombocytosis, unspecified: Secondary | ICD-10-CM | POA: Insufficient documentation

## 2021-06-01 DIAGNOSIS — R161 Splenomegaly, not elsewhere classified: Secondary | ICD-10-CM | POA: Insufficient documentation

## 2021-06-01 DIAGNOSIS — Z6830 Body mass index (BMI) 30.0-30.9, adult: Secondary | ICD-10-CM | POA: Insufficient documentation

## 2021-06-01 DIAGNOSIS — G629 Polyneuropathy, unspecified: Secondary | ICD-10-CM | POA: Diagnosis not present

## 2021-06-01 LAB — CMP (CANCER CENTER ONLY)
ALT: 27 U/L (ref 0–44)
AST: 28 U/L (ref 15–41)
Albumin: 4.2 g/dL (ref 3.5–5.0)
Alkaline Phosphatase: 58 U/L (ref 38–126)
Anion gap: 8 (ref 5–15)
BUN: 19 mg/dL (ref 8–23)
CO2: 27 mmol/L (ref 22–32)
Calcium: 9.4 mg/dL (ref 8.9–10.3)
Chloride: 106 mmol/L (ref 98–111)
Creatinine: 0.97 mg/dL (ref 0.61–1.24)
GFR, Estimated: >60 mL/min (ref >60)
Glucose, Bld: 86 mg/dL (ref 70–99)
Potassium: 4.4 mmol/L (ref 3.5–5.1)
Sodium: 141 mmol/L (ref 135–145)
Total Bilirubin: 1 mg/dL (ref 0.3–1.2)
Total Protein: 7.4 g/dL (ref 6.5–8.1)

## 2021-06-01 LAB — CBC WITH DIFFERENTIAL/PLATELET
Abs Immature Granulocytes: 0.03 10*3/uL (ref 0.00–0.07)
Basophils Absolute: 0 10*3/uL (ref 0.0–0.1)
Basophils Relative: 1 %
Eosinophils Absolute: 0.1 10*3/uL (ref 0.0–0.5)
Eosinophils Relative: 2 %
HCT: 42.9 % (ref 39.0–52.0)
Hemoglobin: 14.9 g/dL (ref 13.0–17.0)
Immature Granulocytes: 1 %
Lymphocytes Relative: 13 %
Lymphs Abs: 0.8 10*3/uL (ref 0.7–4.0)
MCH: 37.7 pg — ABNORMAL HIGH (ref 26.0–34.0)
MCHC: 34.7 g/dL (ref 30.0–36.0)
MCV: 108.6 fL — ABNORMAL HIGH (ref 80.0–100.0)
Monocytes Absolute: 0.6 10*3/uL (ref 0.1–1.0)
Monocytes Relative: 11 %
Neutro Abs: 4.4 10*3/uL (ref 1.7–7.7)
Neutrophils Relative %: 72 %
Platelets: 362 10*3/uL (ref 150–400)
RBC: 3.95 MIL/uL — ABNORMAL LOW (ref 4.22–5.81)
RDW: 13.6 % (ref 11.5–15.5)
WBC: 6 10*3/uL (ref 4.0–10.5)
nRBC: 0 % (ref 0.0–0.2)

## 2021-06-16 DIAGNOSIS — G894 Chronic pain syndrome: Secondary | ICD-10-CM | POA: Diagnosis not present

## 2021-06-16 DIAGNOSIS — M47817 Spondylosis without myelopathy or radiculopathy, lumbosacral region: Secondary | ICD-10-CM | POA: Diagnosis not present

## 2021-06-16 DIAGNOSIS — M62830 Muscle spasm of back: Secondary | ICD-10-CM | POA: Diagnosis not present

## 2021-06-16 DIAGNOSIS — M150 Primary generalized (osteo)arthritis: Secondary | ICD-10-CM | POA: Diagnosis not present

## 2021-06-17 DIAGNOSIS — D23122 Other benign neoplasm of skin of left lower eyelid, including canthus: Secondary | ICD-10-CM | POA: Diagnosis not present

## 2021-06-18 ENCOUNTER — Other Ambulatory Visit: Payer: Self-pay | Admitting: Hematology

## 2021-06-19 DIAGNOSIS — M2041 Other hammer toe(s) (acquired), right foot: Secondary | ICD-10-CM | POA: Diagnosis not present

## 2021-06-19 DIAGNOSIS — L600 Ingrowing nail: Secondary | ICD-10-CM | POA: Diagnosis not present

## 2021-07-28 DIAGNOSIS — L600 Ingrowing nail: Secondary | ICD-10-CM | POA: Diagnosis not present

## 2021-07-28 DIAGNOSIS — M205X1 Other deformities of toe(s) (acquired), right foot: Secondary | ICD-10-CM | POA: Diagnosis not present

## 2021-07-28 DIAGNOSIS — M2041 Other hammer toe(s) (acquired), right foot: Secondary | ICD-10-CM | POA: Diagnosis not present

## 2021-07-28 DIAGNOSIS — G8918 Other acute postprocedural pain: Secondary | ICD-10-CM | POA: Diagnosis not present

## 2021-08-18 DIAGNOSIS — M47817 Spondylosis without myelopathy or radiculopathy, lumbosacral region: Secondary | ICD-10-CM | POA: Diagnosis not present

## 2021-08-18 DIAGNOSIS — M62830 Muscle spasm of back: Secondary | ICD-10-CM | POA: Diagnosis not present

## 2021-08-18 DIAGNOSIS — G894 Chronic pain syndrome: Secondary | ICD-10-CM | POA: Diagnosis not present

## 2021-08-18 DIAGNOSIS — M150 Primary generalized (osteo)arthritis: Secondary | ICD-10-CM | POA: Diagnosis not present

## 2021-09-14 DIAGNOSIS — I10 Essential (primary) hypertension: Secondary | ICD-10-CM | POA: Diagnosis not present

## 2021-09-14 DIAGNOSIS — M48062 Spinal stenosis, lumbar region with neurogenic claudication: Secondary | ICD-10-CM | POA: Diagnosis not present

## 2021-09-14 DIAGNOSIS — D473 Essential (hemorrhagic) thrombocythemia: Secondary | ICD-10-CM | POA: Diagnosis not present

## 2021-09-14 DIAGNOSIS — Z23 Encounter for immunization: Secondary | ICD-10-CM | POA: Diagnosis not present

## 2021-09-14 DIAGNOSIS — Z0000 Encounter for general adult medical examination without abnormal findings: Secondary | ICD-10-CM | POA: Diagnosis not present

## 2021-09-14 DIAGNOSIS — Z1389 Encounter for screening for other disorder: Secondary | ICD-10-CM | POA: Diagnosis not present

## 2021-09-14 DIAGNOSIS — E7800 Pure hypercholesterolemia, unspecified: Secondary | ICD-10-CM | POA: Diagnosis not present

## 2021-09-18 ENCOUNTER — Other Ambulatory Visit: Payer: Self-pay | Admitting: Hematology

## 2021-09-28 ENCOUNTER — Other Ambulatory Visit: Payer: Self-pay

## 2021-09-28 ENCOUNTER — Inpatient Hospital Stay: Payer: Medicare Other

## 2021-09-28 ENCOUNTER — Inpatient Hospital Stay: Payer: Medicare Other | Attending: Hematology | Admitting: Hematology

## 2021-09-28 VITALS — BP 116/86 | HR 70 | Temp 97.5°F | Resp 18 | Wt 214.0 lb

## 2021-09-28 DIAGNOSIS — D473 Essential (hemorrhagic) thrombocythemia: Secondary | ICD-10-CM | POA: Insufficient documentation

## 2021-09-28 DIAGNOSIS — D75839 Thrombocytosis, unspecified: Secondary | ICD-10-CM | POA: Diagnosis not present

## 2021-09-28 DIAGNOSIS — Z7982 Long term (current) use of aspirin: Secondary | ICD-10-CM | POA: Diagnosis not present

## 2021-09-28 DIAGNOSIS — Z87891 Personal history of nicotine dependence: Secondary | ICD-10-CM | POA: Insufficient documentation

## 2021-09-28 DIAGNOSIS — R7989 Other specified abnormal findings of blood chemistry: Secondary | ICD-10-CM | POA: Insufficient documentation

## 2021-09-28 DIAGNOSIS — Z79899 Other long term (current) drug therapy: Secondary | ICD-10-CM | POA: Diagnosis not present

## 2021-09-28 DIAGNOSIS — G629 Polyneuropathy, unspecified: Secondary | ICD-10-CM | POA: Insufficient documentation

## 2021-09-28 LAB — CBC WITH DIFFERENTIAL (CANCER CENTER ONLY)
Abs Immature Granulocytes: 0.01 10*3/uL (ref 0.00–0.07)
Basophils Absolute: 0 10*3/uL (ref 0.0–0.1)
Basophils Relative: 1 %
Eosinophils Absolute: 0.1 10*3/uL (ref 0.0–0.5)
Eosinophils Relative: 2 %
HCT: 45.2 % (ref 39.0–52.0)
Hemoglobin: 16 g/dL (ref 13.0–17.0)
Immature Granulocytes: 0 %
Lymphocytes Relative: 14 %
Lymphs Abs: 0.9 10*3/uL (ref 0.7–4.0)
MCH: 38.7 pg — ABNORMAL HIGH (ref 26.0–34.0)
MCHC: 35.4 g/dL (ref 30.0–36.0)
MCV: 109.4 fL — ABNORMAL HIGH (ref 80.0–100.0)
Monocytes Absolute: 0.6 10*3/uL (ref 0.1–1.0)
Monocytes Relative: 10 %
Neutro Abs: 4.4 10*3/uL (ref 1.7–7.7)
Neutrophils Relative %: 73 %
Platelet Count: 352 10*3/uL (ref 150–400)
RBC: 4.13 MIL/uL — ABNORMAL LOW (ref 4.22–5.81)
RDW: 13.2 % (ref 11.5–15.5)
WBC Count: 6 10*3/uL (ref 4.0–10.5)
nRBC: 0 % (ref 0.0–0.2)

## 2021-09-28 LAB — CMP (CANCER CENTER ONLY)
ALT: 21 U/L (ref 0–44)
AST: 29 U/L (ref 15–41)
Albumin: 4.4 g/dL (ref 3.5–5.0)
Alkaline Phosphatase: 68 U/L (ref 38–126)
Anion gap: 10 (ref 5–15)
BUN: 18 mg/dL (ref 8–23)
CO2: 26 mmol/L (ref 22–32)
Calcium: 9.2 mg/dL (ref 8.9–10.3)
Chloride: 105 mmol/L (ref 98–111)
Creatinine: 0.92 mg/dL (ref 0.61–1.24)
GFR, Estimated: >60 mL/min (ref >60)
Glucose, Bld: 100 mg/dL — ABNORMAL HIGH (ref 70–99)
Potassium: 4.3 mmol/L (ref 3.5–5.1)
Sodium: 141 mmol/L (ref 135–145)
Total Bilirubin: 0.7 mg/dL (ref 0.3–1.2)
Total Protein: 8 g/dL (ref 6.5–8.1)

## 2021-10-06 NOTE — Progress Notes (Signed)
HEMATOLOGY ONCOLOGY PROGRESS NOTE  Date of service:  .09/28/2021   Patient Care Team: [name], MD as PCP - General (Internal Medicine)  CC F/u for mx of Essential thrombocythemia  Diagnosis:  JAK2 Essential thrombocytosis   Current Treatment: Hydroxyurea 1000 mg daily  INTERVAL HISTORY:  Jordan Vazquez is here to follow up on his Jak2 positive essential thrombocytosis. The patient's last visit with [place] was on 06/01/2021. The pt reports that he is doing well overall.  He notes chronic back issues and radiculopathy/neuropathy in his lower extremities. No intolerance to hydroxyurea. No new infection issues.  Lab results today 09/28/2021 show hemoglobin of 16 with a hematocrit of 45.2, WBC count of 5k and platelets of 352k. CMP is unremarkable.   REVIEW OF SYSTEMS:   .10 Point review of Systems was done is negative except as noted above.   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    Hypertension     Past Surgical History:  Procedure Laterality Date   CARDIAC CATHETERIZATION     16 years ago negative    HERNIA REPAIR     right inguinal hernia surgery    I & D KNEE WITH POLY EXCHANGE  12/06/2011   Procedure: IRRIGATION AND DEBRIDEMENT KNEE WITH POLY EXCHANGE;  Surgeon: Mauri Pole, MD;  Location: [facility];  Service: Orthopedics;  Laterality: Right;  Right Total Knee Irrigation and Debridement with Poly Exchange   JOINT REPLACEMENT     left knee 5/12, right TKA 10/12     Social History   Tobacco Use   Smoking status: Former    Packs/day: 0.25    Types: Cigarettes    Quit date: 11/16/1963    Years since quitting: 57.9   Smokeless tobacco: Never  Vaping Use   Vaping Use: Never used  Substance Use Topics   Alcohol use: Yes    Alcohol/week: 2.0 standard drinks    Types: 2 Cans of beer per week    Drug use: No    ALLERGIES:  is allergic to niacin and related.  MEDICATIONS:  Current Outpatient Medications  Medication Sig Dispense Refill   aspirin 325 MG tablet Take 325 mg by mouth daily.      atorvastatin (LIPITOR) 40 MG tablet Take 40 mg by mouth every morning.     baclofen (LIORESAL) 10 MG tablet TAKE 1/2 TABLET BY MOUTH QID AS NEEDED  0   Cholecalciferol (VITAMIN D-3) 1000 UNITS CAPS Take 1,000 Units by mouth daily.     gabapentin (NEURONTIN) 800 MG tablet Take 800 mg by mouth 3 (three) times daily.     hydrochlorothiazide (HYDRODIURIL) 12.5 MG tablet Take 12.5 mg by mouth every morning.     hydroxyurea (HYDREA) 500 MG capsule TAKE 2 CAPSULES(1000 MG) BY MOUTH DAILY WITH FOOD 180 capsule 0   Multiple Vitamin (MULITIVITAMIN WITH MINERALS) TABS Take 2 tablets by mouth daily.      Omega-3 Fatty Acids (SEA-OMEGA 30 PO) Take 1 capsule by mouth daily.     oxyCODONE (OXY IR/ROXICODONE) 5 MG immediate release tablet   0   telmisartan (MICARDIS) 40 MG tablet Take 40 mg by mouth daily.     gabapentin (NEURONTIN) 300 MG capsule Take 300 mg by mouth 3 (three) times daily.     No current facility-administered medications for this visit.  PHYSICAL EXAMINATION: ECOG PERFORMANCE STATUS: 1 - Symptomatic but completely ambulatory  Vitals:   09/28/21 0852  BP: 116/86  Pulse: 70  Resp: 18  Temp: (!) 97.5 F (36.4 C)  SpO2: 100%     Filed Weights   09/28/21 0852  Weight: 214 lb (97.1 kg)    .Body mass index is 30.71 kg/m.   [name] GENERAL:alert, in no acute distress and comfortable SKIN: no acute rashes, no significant lesions EYES: conjunctiva are pink and non-injected, sclera anicteric OROPHARYNX: MMM, no exudates, no oropharyngeal erythema or ulceration NECK: supple, no JVD LYMPH:  no palpable lymphadenopathy in the cervical, axillary or inguinal regions LUNGS: clear to auscultation b/l with normal respiratory effort HEART: regular rate & rhythm ABDOMEN:  normoactive bowel  sounds , non tender, not distended. Extremity: no pedal edema PSYCH: alert & oriented x 3 with fluent speech NEURO: no focal motor/sensory deficits    LABORATORY DATA:   I have reviewed the data as listed  . CBC Latest Ref Rng & Units 09/28/2021 06/01/2021 01/26/2021  WBC 4.0 - 10.5 K/uL 6.0 6.0 5.4  Hemoglobin 13.0 - 17.0 g/dL 16.0 14.9 15.6  Hematocrit 39.0 - 52.0 % 45.2 42.9 47.0  Platelets 150 - 400 K/uL 352 362 418(H)    . CMP Latest Ref Rng & Units 09/28/2021 06/01/2021 01/26/2021  Glucose 70 - 99 mg/dL 100(H) 86 60(L)  BUN 8 - 23 mg/dL 18 19 22   Creatinine 0.61 - 1.24 mg/dL 0.92 0.97 1.13  Sodium 135 - 145 mmol/L 141 141 141  Potassium 3.5 - 5.1 mmol/L 4.3 4.4 4.7  Chloride 98 - 111 mmol/L 105 106 105  CO2 22 - 32 mmol/L 26 27 28   Calcium 8.9 - 10.3 mg/dL 9.2 9.4 9.1  Total Protein 6.5 - 8.1 g/dL 8.0 7.4 7.2  Total Bilirubin 0.3 - 1.2 mg/dL 0.7 1.0 0.6  Alkaline Phos 38 - 126 U/L 68 58 69  AST 15 - 41 U/L 29 28 30   ALT 0 - 44 U/L 21 27 32    RADIOGRAPHIC STUDIES: I have personally reviewed the radiological images as listed and agreed with the findings in the report.  [place] abd 03/15/2017: IMPRESSION: 1. Liver is echogenic consistent with fatty infiltration and/or hepatocellular disease. No focal hepatic abnormality. No gallstones or biliary distention.   2. Spleen is enlarged at 14.4 cm with a volume of 620 cc.    Electronically Signed   By: Marcello Moores  Register   On: 03/15/2017 09:15   ASSESSMENT & PLAN: '  75 y.o.  Caucasian male with   #1  Jak2 positive Essential Thrombocytosis.   PLAN: -Discussed pt labwork today, 09/28/2021; blood counts are stable with normal platelet counts, chemistries within normal limits. -The pt has no prohibitive toxicities from continuing 1000 mg Hydroxyurea daily at this time.  -Recommended that the pt continue to eat well and drink at least 48-64 oz of water each day. -Continue 81 mg Aspirin daily. -Will see back in 4 months with  labs.   #2 Abnormal LFTs - AST and ALT have normalized on labs today.  Likely from fatty liver. -will continue to monitor.   FOLLOW UP: RTC with Dr Irene Limbo with labs in 4 months  . The total time spent in the appointment was 20 minutes and more than 50% was on counseling and direct patient cares.   All of the patient's questions were answered with apparent satisfaction. The patient knows to call the clinic with any problems, questions or concerns.  Sullivan Lone MD [place] AAHIVMS [facility] Hematology/Oncology Physician [facility]

## 2021-10-13 DIAGNOSIS — M47817 Spondylosis without myelopathy or radiculopathy, lumbosacral region: Secondary | ICD-10-CM | POA: Diagnosis not present

## 2021-10-13 DIAGNOSIS — M150 Primary generalized (osteo)arthritis: Secondary | ICD-10-CM | POA: Diagnosis not present

## 2021-10-13 DIAGNOSIS — M62830 Muscle spasm of back: Secondary | ICD-10-CM | POA: Diagnosis not present

## 2021-10-13 DIAGNOSIS — G894 Chronic pain syndrome: Secondary | ICD-10-CM | POA: Diagnosis not present

## 2021-10-27 DIAGNOSIS — Z961 Presence of intraocular lens: Secondary | ICD-10-CM | POA: Diagnosis not present

## 2021-12-08 DIAGNOSIS — M150 Primary generalized (osteo)arthritis: Secondary | ICD-10-CM | POA: Diagnosis not present

## 2021-12-08 DIAGNOSIS — M47817 Spondylosis without myelopathy or radiculopathy, lumbosacral region: Secondary | ICD-10-CM | POA: Diagnosis not present

## 2021-12-08 DIAGNOSIS — M62830 Muscle spasm of back: Secondary | ICD-10-CM | POA: Diagnosis not present

## 2021-12-08 DIAGNOSIS — G894 Chronic pain syndrome: Secondary | ICD-10-CM | POA: Diagnosis not present

## 2021-12-16 ENCOUNTER — Other Ambulatory Visit: Payer: Self-pay | Admitting: Hematology

## 2022-01-29 ENCOUNTER — Other Ambulatory Visit: Payer: Self-pay

## 2022-01-29 DIAGNOSIS — D473 Essential (hemorrhagic) thrombocythemia: Secondary | ICD-10-CM

## 2022-02-01 ENCOUNTER — Inpatient Hospital Stay: Payer: Medicare Other | Attending: Hematology

## 2022-02-01 ENCOUNTER — Inpatient Hospital Stay: Payer: Medicare Other | Admitting: Hematology

## 2022-02-01 ENCOUNTER — Other Ambulatory Visit: Payer: Self-pay

## 2022-02-01 VITALS — BP 118/75 | HR 63 | Temp 97.9°F | Resp 20 | Ht 70.0 in | Wt 216.6 lb

## 2022-02-01 DIAGNOSIS — D473 Essential (hemorrhagic) thrombocythemia: Secondary | ICD-10-CM

## 2022-02-01 LAB — CBC WITH DIFFERENTIAL (CANCER CENTER ONLY)
Abs Immature Granulocytes: 0.02 10*3/uL (ref 0.00–0.07)
Basophils Absolute: 0 10*3/uL (ref 0.0–0.1)
Basophils Relative: 1 %
Eosinophils Absolute: 0.1 10*3/uL (ref 0.0–0.5)
Eosinophils Relative: 3 %
HCT: 42.5 % (ref 39.0–52.0)
Hemoglobin: 14.9 g/dL (ref 13.0–17.0)
Immature Granulocytes: 0 %
Lymphocytes Relative: 18 %
Lymphs Abs: 0.9 10*3/uL (ref 0.7–4.0)
MCH: 38.2 pg — ABNORMAL HIGH (ref 26.0–34.0)
MCHC: 35.1 g/dL (ref 30.0–36.0)
MCV: 109 fL — ABNORMAL HIGH (ref 80.0–100.0)
Monocytes Absolute: 0.7 10*3/uL (ref 0.1–1.0)
Monocytes Relative: 14 %
Neutro Abs: 3.1 10*3/uL (ref 1.7–7.7)
Neutrophils Relative %: 64 %
Platelet Count: 284 10*3/uL (ref 150–400)
RBC: 3.9 MIL/uL — ABNORMAL LOW (ref 4.22–5.81)
RDW: 13.1 % (ref 11.5–15.5)
WBC Count: 4.9 10*3/uL (ref 4.0–10.5)
nRBC: 0 % (ref 0.0–0.2)

## 2022-02-01 LAB — CMP (CANCER CENTER ONLY)
ALT: 22 U/L (ref 0–44)
AST: 27 U/L (ref 15–41)
Albumin: 4.5 g/dL (ref 3.5–5.0)
Alkaline Phosphatase: 57 U/L (ref 38–126)
Anion gap: 6 (ref 5–15)
BUN: 25 mg/dL — ABNORMAL HIGH (ref 8–23)
CO2: 30 mmol/L (ref 22–32)
Calcium: 9.5 mg/dL (ref 8.9–10.3)
Chloride: 105 mmol/L (ref 98–111)
Creatinine: 1.01 mg/dL (ref 0.61–1.24)
GFR, Estimated: >60 mL/min (ref >60)
Glucose, Bld: 87 mg/dL (ref 70–99)
Potassium: 4.5 mmol/L (ref 3.5–5.1)
Sodium: 141 mmol/L (ref 135–145)
Total Bilirubin: 0.9 mg/dL (ref 0.3–1.2)
Total Protein: 7.5 g/dL (ref 6.5–8.1)

## 2022-02-01 NOTE — Progress Notes (Signed)
? ?HEMATOLOGY ONCOLOGY PROGRESS NOTE ? ?Date of service:  .02/01/2022 ? ? ?Patient Care Team: ?[name], MD as PCP - General (Internal Medicine) ? ?CC ?Follow-up for continued evaluation and management of essential thrombocythemia ? ?Diagnosis:  JAK2 Essential thrombocytosis ?  ?Current Treatment: Hydroxyurea 1000 mg daily ? ?INTERVAL HISTORY: ? ?[name] is here for continued evaluation and management of his JAK2 positive essential thrombocytosis. ?He notes continued back pain and lower extremity discomfort due to his lumbar spinal stenosis.  He is following up with a spine specialist for this and to consider possibility of surgery. ?He notes no issues with tolerating his current dose of hydroxyurea. ?No new leg pain or swelling. ?No fevers no chills no night sweats. ?No infection issues. ?No abnormal bleeding or bruising. ? ?Labs done today were reviewed in detail. ? ?REVIEW OF SYSTEMS:   ?10 Point review of Systems was done is negative except as noted above. ? ? ?Past Medical History:  ?Diagnosis Date  ? Arthritis   ? arhtiritis in knees , shoulders, elbows   ? Essential thrombocytosis ([place]) 09/19/2015  ? 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   ? Hypertension   ? ? ?Past Surgical History:  ?Procedure Laterality Date  ? CARDIAC CATHETERIZATION    ? 16 years ago negative   ? HERNIA REPAIR    ? right inguinal hernia surgery   ? I & D KNEE WITH POLY EXCHANGE  12/06/2011  ? Procedure: IRRIGATION AND DEBRIDEMENT KNEE WITH POLY EXCHANGE;  Surgeon: Mauri Pole, MD;  Location: [facility];  Service: Orthopedics;  Laterality: Right;  Right Total Knee Irrigation and Debridement with Poly Exchange  ? JOINT REPLACEMENT    ? left knee 5/12, right TKA 10/12   ? ? ?Social History  ? ?Tobacco Use  ? Smoking status: Former  ?  Packs/day: 0.25  ?  Types: Cigarettes  ?  Quit date: 11/16/1963  ?  Years since quitting: 58.2  ? Smokeless tobacco: Never  ?Vaping Use  ? Vaping Use: Never used  ?Substance  Use Topics  ? Alcohol use: Yes  ?  Alcohol/week: 2.0 standard drinks  ?  Types: 2 Cans of beer per week  ? Drug use: No  ? ? ?ALLERGIES:  is allergic to niacin and related. ? ?MEDICATIONS:  ?Current Outpatient Medications  ?Medication Sig Dispense Refill  ? aspirin 325 MG tablet Take 325 mg by mouth daily.     ? atorvastatin (LIPITOR) 40 MG tablet Take 40 mg by mouth every morning.    ? baclofen (LIORESAL) 10 MG tablet TAKE 1/2 TABLET BY MOUTH QID AS NEEDED  0  ? Cholecalciferol (VITAMIN D-3) 1000 UNITS CAPS Take 1,000 Units by mouth daily.    ? gabapentin (NEURONTIN) 300 MG capsule Take 300 mg by mouth 3 (three) times daily.    ? gabapentin (NEURONTIN) 800 MG tablet Take 800 mg by mouth 3 (three) times daily.    ? hydrochlorothiazide (HYDRODIURIL) 12.5 MG tablet Take 12.5 mg by mouth every morning.    ? hydroxyurea (HYDREA) 500 MG capsule TAKE 2 CAPSULES(1000 MG) BY MOUTH DAILY WITH FOOD 180 capsule 0  ? Multiple Vitamin (MULITIVITAMIN WITH MINERALS) TABS Take 2 tablets by mouth daily.     ? Omega-3 Fatty Acids (SEA-OMEGA 30 PO) Take 1 capsule by mouth daily.    ? oxyCODONE (OXY IR/ROXICODONE) 5 MG immediate release tablet   0  ? telmisartan (MICARDIS) 40 MG tablet Take 40 mg by mouth daily.    ? ?  No current facility-administered medications for this visit.  ? ? ?PHYSICAL EXAMINATION: ?ECOG PERFORMANCE STATUS: 1 - Symptomatic but completely ambulatory ? ?Vitals:  ? 02/01/22 0848  ?BP: 118/75  ?Pulse: 63  ?Resp: 20  ?Temp: 97.9 ?F (36.6 ?C)  ?SpO2: 98%  ? ? ? ?Filed Weights  ? 02/01/22 0848  ?Weight: 216 lb 9 oz (98.2 kg)  ? ? ?.Body mass index is 31.07 kg/m?.  ? ?NAD ?GENERAL:alert, in no acute distress and comfortable ?SKIN: no acute rashes, no significant lesions ?EYES: conjunctiva are pink and non-injected, sclera anicteric ?OROPHARYNX: MMM, no exudates, no oropharyngeal erythema or ulceration ?NECK: supple, no JVD ?LYMPH:  no palpable lymphadenopathy in the cervical, axillary or inguinal regions ?LUNGS: clear  to auscultation b/l with normal respiratory effort ?HEART: regular rate & rhythm ?ABDOMEN:  normoactive bowel sounds , non tender, not distended. ?Extremity: no pedal edema ?PSYCH: alert & oriented x 3 with fluent speech ?NEURO: no focal motor/sensory deficits ? ? ?LABORATORY DATA:  ? ?I have reviewed the data as listed ? ?. ? ?  Latest Ref Rng & Units 02/01/2022  ?  7:48 AM 09/28/2021  ?  7:36 AM 06/01/2021  ?  7:32 AM  ?CBC  ?WBC 4.0 - 10.5 K/uL 4.9   6.0   6.0    ?Hemoglobin 13.0 - 17.0 g/dL 14.9   16.0   14.9    ?Hematocrit 39.0 - 52.0 % 42.5   45.2   42.9    ?Platelets 150 - 400 K/uL 284   352   362    ? ? ?  Latest Ref Rng & Units 02/01/2022  ?  7:48 AM 09/28/2021  ?  7:36 AM 06/01/2021  ?  7:32 AM  ?CMP  ?Glucose 70 - 99 mg/dL 87   100  C 86    ?BUN 8 - 23 mg/dL 25   18  C 19    ?Creatinine 0.61 - 1.24 mg/dL 1.01   0.92  C 0.97    ?Sodium 135 - 145 mmol/L 141   141  C 141    ?Potassium 3.5 - 5.1 mmol/L 4.5   4.3  C 4.4    ?Chloride 98 - 111 mmol/L 105   105  C 106    ?CO2 22 - 32 mmol/L 30   26  C 27    ?Calcium 8.9 - 10.3 mg/dL 9.5   9.2  C 9.4    ?Total Protein 6.5 - 8.1 g/dL 7.5   8.0  C 7.4    ?Total Bilirubin 0.3 - 1.2 mg/dL 0.9   0.7  C 1.0    ?Alkaline Phos 38 - 126 U/L 57   68  C 58    ?AST 15 - 41 U/L 27   29  C 28    ?ALT 0 - 44 U/L 22   21  C 27    ?  ?C Corrected result  ? ? ? ? ?RADIOGRAPHIC STUDIES: ?I have personally reviewed the radiological images as listed and agreed with the findings in the report. ? ?[place] abd 03/15/2017: IMPRESSION: ?1. Liver is echogenic consistent with fatty infiltration and/or ?hepatocellular disease. No focal hepatic abnormality. No gallstones ?or biliary distention. ?  ?2. Spleen is enlarged at 14.4 cm with a volume of 620 cc. ?  ? Electronically Signed ?  By: [place] ?  On: 03/15/2017 09:15 ? ? ?ASSESSMENT & PLAN: ' ? ?76 y.o.  Caucasian male with  ? ?#1  JAK2  positive essential thrombocytosis ? ?PLAN: ?-Discussed lab results from today. ?-CBC shows normal  hemoglobin of 14.9, platelets of 284k and WBC count of 4.9k ?-CMP within normal limits ?-Given patient's platelets are downtrending we will cut down his hydroxyurea to 2 tablets [1000 mg] Monday through Saturday and 1 tablet [500 mg] on Sunday. ?-No toxicities from his current use of hydroxyurea. ?-Continue aspirin 81 mg p.o. daily. ?-No indication for complications from his essential thrombocytosis at this time. ?-We shall continue to monitor his platelet counts and other labs every 4 months and prior to use the lowest effective dose of hydroxyurea to maintain platelets between 200-400k ? ?#2 Abnormal LFTs - AST and ALT have normalized on labs today.  Likely from fatty liver. ?-will continue to monitor. ? ? ?FOLLOW UP: ?Return to clinic with Dr. Irene Limbo with labs in 4 months ? ?The total time spent in the appointment was 20 minutes*. ? ?All of the patient's questions were answered with apparent satisfaction. The patient knows to call the clinic with any problems, questions or concerns. ? ? ?Sullivan Lone MD MS AAHIVMS [facility] ?Hematology/Oncology Physician ?[place] ? ?.*Total Encounter Time as defined by the Centers for Medicare and Medicaid Services includes, in addition to the face-to-face time of a patient visit (documented in the note above) non-face-to-face time: obtaining and reviewing outside history, ordering and reviewing medications, tests or procedures, care coordination (communications with other health care professionals or caregivers) and documentation in the medical record. ? ? ?

## 2022-02-02 DIAGNOSIS — G894 Chronic pain syndrome: Secondary | ICD-10-CM | POA: Diagnosis not present

## 2022-02-02 DIAGNOSIS — M150 Primary generalized (osteo)arthritis: Secondary | ICD-10-CM | POA: Diagnosis not present

## 2022-02-02 DIAGNOSIS — M62830 Muscle spasm of back: Secondary | ICD-10-CM | POA: Diagnosis not present

## 2022-02-02 DIAGNOSIS — M47817 Spondylosis without myelopathy or radiculopathy, lumbosacral region: Secondary | ICD-10-CM | POA: Diagnosis not present

## 2022-02-18 DIAGNOSIS — M48062 Spinal stenosis, lumbar region with neurogenic claudication: Secondary | ICD-10-CM | POA: Diagnosis not present

## 2022-02-18 DIAGNOSIS — Z6831 Body mass index (BMI) 31.0-31.9, adult: Secondary | ICD-10-CM | POA: Diagnosis not present

## 2022-02-18 DIAGNOSIS — M4316 Spondylolisthesis, lumbar region: Secondary | ICD-10-CM | POA: Diagnosis not present

## 2022-02-18 DIAGNOSIS — I10 Essential (primary) hypertension: Secondary | ICD-10-CM | POA: Diagnosis not present

## 2022-03-02 DIAGNOSIS — M5412 Radiculopathy, cervical region: Secondary | ICD-10-CM | POA: Diagnosis not present

## 2022-03-02 DIAGNOSIS — M9903 Segmental and somatic dysfunction of lumbar region: Secondary | ICD-10-CM | POA: Diagnosis not present

## 2022-03-02 DIAGNOSIS — M9901 Segmental and somatic dysfunction of cervical region: Secondary | ICD-10-CM | POA: Diagnosis not present

## 2022-03-02 DIAGNOSIS — M5441 Lumbago with sciatica, right side: Secondary | ICD-10-CM | POA: Diagnosis not present

## 2022-03-04 DIAGNOSIS — M9901 Segmental and somatic dysfunction of cervical region: Secondary | ICD-10-CM | POA: Diagnosis not present

## 2022-03-04 DIAGNOSIS — M5412 Radiculopathy, cervical region: Secondary | ICD-10-CM | POA: Diagnosis not present

## 2022-03-04 DIAGNOSIS — M9903 Segmental and somatic dysfunction of lumbar region: Secondary | ICD-10-CM | POA: Diagnosis not present

## 2022-03-04 DIAGNOSIS — M5441 Lumbago with sciatica, right side: Secondary | ICD-10-CM | POA: Diagnosis not present

## 2022-03-05 DIAGNOSIS — M5412 Radiculopathy, cervical region: Secondary | ICD-10-CM | POA: Diagnosis not present

## 2022-03-05 DIAGNOSIS — M9903 Segmental and somatic dysfunction of lumbar region: Secondary | ICD-10-CM | POA: Diagnosis not present

## 2022-03-05 DIAGNOSIS — M5441 Lumbago with sciatica, right side: Secondary | ICD-10-CM | POA: Diagnosis not present

## 2022-03-05 DIAGNOSIS — M9901 Segmental and somatic dysfunction of cervical region: Secondary | ICD-10-CM | POA: Diagnosis not present

## 2022-03-07 ENCOUNTER — Other Ambulatory Visit: Payer: Self-pay | Admitting: Hematology

## 2022-03-08 DIAGNOSIS — M5441 Lumbago with sciatica, right side: Secondary | ICD-10-CM | POA: Diagnosis not present

## 2022-03-08 DIAGNOSIS — M9903 Segmental and somatic dysfunction of lumbar region: Secondary | ICD-10-CM | POA: Diagnosis not present

## 2022-03-08 DIAGNOSIS — M9901 Segmental and somatic dysfunction of cervical region: Secondary | ICD-10-CM | POA: Diagnosis not present

## 2022-03-08 DIAGNOSIS — M5412 Radiculopathy, cervical region: Secondary | ICD-10-CM | POA: Diagnosis not present

## 2022-03-10 DIAGNOSIS — M48062 Spinal stenosis, lumbar region with neurogenic claudication: Secondary | ICD-10-CM | POA: Diagnosis not present

## 2022-03-10 DIAGNOSIS — L989 Disorder of the skin and subcutaneous tissue, unspecified: Secondary | ICD-10-CM | POA: Diagnosis not present

## 2022-03-10 DIAGNOSIS — I10 Essential (primary) hypertension: Secondary | ICD-10-CM | POA: Diagnosis not present

## 2022-03-10 DIAGNOSIS — M47816 Spondylosis without myelopathy or radiculopathy, lumbar region: Secondary | ICD-10-CM | POA: Diagnosis not present

## 2022-03-11 DIAGNOSIS — M9901 Segmental and somatic dysfunction of cervical region: Secondary | ICD-10-CM | POA: Diagnosis not present

## 2022-03-11 DIAGNOSIS — M5412 Radiculopathy, cervical region: Secondary | ICD-10-CM | POA: Diagnosis not present

## 2022-03-11 DIAGNOSIS — M5441 Lumbago with sciatica, right side: Secondary | ICD-10-CM | POA: Diagnosis not present

## 2022-03-11 DIAGNOSIS — M9903 Segmental and somatic dysfunction of lumbar region: Secondary | ICD-10-CM | POA: Diagnosis not present

## 2022-03-12 DIAGNOSIS — M5412 Radiculopathy, cervical region: Secondary | ICD-10-CM | POA: Diagnosis not present

## 2022-03-12 DIAGNOSIS — M5441 Lumbago with sciatica, right side: Secondary | ICD-10-CM | POA: Diagnosis not present

## 2022-03-12 DIAGNOSIS — M9903 Segmental and somatic dysfunction of lumbar region: Secondary | ICD-10-CM | POA: Diagnosis not present

## 2022-03-12 DIAGNOSIS — M9901 Segmental and somatic dysfunction of cervical region: Secondary | ICD-10-CM | POA: Diagnosis not present

## 2022-03-16 DIAGNOSIS — M5412 Radiculopathy, cervical region: Secondary | ICD-10-CM | POA: Diagnosis not present

## 2022-03-16 DIAGNOSIS — M9903 Segmental and somatic dysfunction of lumbar region: Secondary | ICD-10-CM | POA: Diagnosis not present

## 2022-03-16 DIAGNOSIS — M9901 Segmental and somatic dysfunction of cervical region: Secondary | ICD-10-CM | POA: Diagnosis not present

## 2022-03-16 DIAGNOSIS — M5441 Lumbago with sciatica, right side: Secondary | ICD-10-CM | POA: Diagnosis not present

## 2022-03-18 DIAGNOSIS — M5412 Radiculopathy, cervical region: Secondary | ICD-10-CM | POA: Diagnosis not present

## 2022-03-18 DIAGNOSIS — M9901 Segmental and somatic dysfunction of cervical region: Secondary | ICD-10-CM | POA: Diagnosis not present

## 2022-03-18 DIAGNOSIS — M9903 Segmental and somatic dysfunction of lumbar region: Secondary | ICD-10-CM | POA: Diagnosis not present

## 2022-03-18 DIAGNOSIS — M5441 Lumbago with sciatica, right side: Secondary | ICD-10-CM | POA: Diagnosis not present

## 2022-03-19 DIAGNOSIS — M5441 Lumbago with sciatica, right side: Secondary | ICD-10-CM | POA: Diagnosis not present

## 2022-03-19 DIAGNOSIS — M9903 Segmental and somatic dysfunction of lumbar region: Secondary | ICD-10-CM | POA: Diagnosis not present

## 2022-03-19 DIAGNOSIS — M9901 Segmental and somatic dysfunction of cervical region: Secondary | ICD-10-CM | POA: Diagnosis not present

## 2022-03-19 DIAGNOSIS — M5412 Radiculopathy, cervical region: Secondary | ICD-10-CM | POA: Diagnosis not present

## 2022-03-24 DIAGNOSIS — M5441 Lumbago with sciatica, right side: Secondary | ICD-10-CM | POA: Diagnosis not present

## 2022-03-24 DIAGNOSIS — M9901 Segmental and somatic dysfunction of cervical region: Secondary | ICD-10-CM | POA: Diagnosis not present

## 2022-03-24 DIAGNOSIS — M5412 Radiculopathy, cervical region: Secondary | ICD-10-CM | POA: Diagnosis not present

## 2022-03-24 DIAGNOSIS — M9903 Segmental and somatic dysfunction of lumbar region: Secondary | ICD-10-CM | POA: Diagnosis not present

## 2022-03-25 DIAGNOSIS — M9903 Segmental and somatic dysfunction of lumbar region: Secondary | ICD-10-CM | POA: Diagnosis not present

## 2022-03-25 DIAGNOSIS — M5412 Radiculopathy, cervical region: Secondary | ICD-10-CM | POA: Diagnosis not present

## 2022-03-25 DIAGNOSIS — M9901 Segmental and somatic dysfunction of cervical region: Secondary | ICD-10-CM | POA: Diagnosis not present

## 2022-03-25 DIAGNOSIS — M5441 Lumbago with sciatica, right side: Secondary | ICD-10-CM | POA: Diagnosis not present

## 2022-03-26 DIAGNOSIS — M9903 Segmental and somatic dysfunction of lumbar region: Secondary | ICD-10-CM | POA: Diagnosis not present

## 2022-03-26 DIAGNOSIS — M9901 Segmental and somatic dysfunction of cervical region: Secondary | ICD-10-CM | POA: Diagnosis not present

## 2022-03-26 DIAGNOSIS — M5441 Lumbago with sciatica, right side: Secondary | ICD-10-CM | POA: Diagnosis not present

## 2022-03-26 DIAGNOSIS — M5412 Radiculopathy, cervical region: Secondary | ICD-10-CM | POA: Diagnosis not present

## 2022-03-30 DIAGNOSIS — M47817 Spondylosis without myelopathy or radiculopathy, lumbosacral region: Secondary | ICD-10-CM | POA: Diagnosis not present

## 2022-03-30 DIAGNOSIS — M62830 Muscle spasm of back: Secondary | ICD-10-CM | POA: Diagnosis not present

## 2022-03-30 DIAGNOSIS — G894 Chronic pain syndrome: Secondary | ICD-10-CM | POA: Diagnosis not present

## 2022-03-30 DIAGNOSIS — M150 Primary generalized (osteo)arthritis: Secondary | ICD-10-CM | POA: Diagnosis not present

## 2022-03-31 DIAGNOSIS — M9903 Segmental and somatic dysfunction of lumbar region: Secondary | ICD-10-CM | POA: Diagnosis not present

## 2022-03-31 DIAGNOSIS — M9901 Segmental and somatic dysfunction of cervical region: Secondary | ICD-10-CM | POA: Diagnosis not present

## 2022-03-31 DIAGNOSIS — M5441 Lumbago with sciatica, right side: Secondary | ICD-10-CM | POA: Diagnosis not present

## 2022-03-31 DIAGNOSIS — M5412 Radiculopathy, cervical region: Secondary | ICD-10-CM | POA: Diagnosis not present

## 2022-04-01 DIAGNOSIS — M5441 Lumbago with sciatica, right side: Secondary | ICD-10-CM | POA: Diagnosis not present

## 2022-04-01 DIAGNOSIS — M5412 Radiculopathy, cervical region: Secondary | ICD-10-CM | POA: Diagnosis not present

## 2022-04-01 DIAGNOSIS — M9901 Segmental and somatic dysfunction of cervical region: Secondary | ICD-10-CM | POA: Diagnosis not present

## 2022-04-01 DIAGNOSIS — M9903 Segmental and somatic dysfunction of lumbar region: Secondary | ICD-10-CM | POA: Diagnosis not present

## 2022-04-02 DIAGNOSIS — M5441 Lumbago with sciatica, right side: Secondary | ICD-10-CM | POA: Diagnosis not present

## 2022-04-02 DIAGNOSIS — M9903 Segmental and somatic dysfunction of lumbar region: Secondary | ICD-10-CM | POA: Diagnosis not present

## 2022-04-02 DIAGNOSIS — M9901 Segmental and somatic dysfunction of cervical region: Secondary | ICD-10-CM | POA: Diagnosis not present

## 2022-04-02 DIAGNOSIS — M5412 Radiculopathy, cervical region: Secondary | ICD-10-CM | POA: Diagnosis not present

## 2022-04-08 DIAGNOSIS — M5412 Radiculopathy, cervical region: Secondary | ICD-10-CM | POA: Diagnosis not present

## 2022-04-08 DIAGNOSIS — M5441 Lumbago with sciatica, right side: Secondary | ICD-10-CM | POA: Diagnosis not present

## 2022-04-08 DIAGNOSIS — M9901 Segmental and somatic dysfunction of cervical region: Secondary | ICD-10-CM | POA: Diagnosis not present

## 2022-04-08 DIAGNOSIS — M9903 Segmental and somatic dysfunction of lumbar region: Secondary | ICD-10-CM | POA: Diagnosis not present

## 2022-04-09 DIAGNOSIS — M5441 Lumbago with sciatica, right side: Secondary | ICD-10-CM | POA: Diagnosis not present

## 2022-04-09 DIAGNOSIS — M9903 Segmental and somatic dysfunction of lumbar region: Secondary | ICD-10-CM | POA: Diagnosis not present

## 2022-04-09 DIAGNOSIS — M9901 Segmental and somatic dysfunction of cervical region: Secondary | ICD-10-CM | POA: Diagnosis not present

## 2022-04-09 DIAGNOSIS — M5412 Radiculopathy, cervical region: Secondary | ICD-10-CM | POA: Diagnosis not present

## 2022-04-15 DIAGNOSIS — M9903 Segmental and somatic dysfunction of lumbar region: Secondary | ICD-10-CM | POA: Diagnosis not present

## 2022-04-15 DIAGNOSIS — M9901 Segmental and somatic dysfunction of cervical region: Secondary | ICD-10-CM | POA: Diagnosis not present

## 2022-04-15 DIAGNOSIS — M5412 Radiculopathy, cervical region: Secondary | ICD-10-CM | POA: Diagnosis not present

## 2022-04-15 DIAGNOSIS — M5441 Lumbago with sciatica, right side: Secondary | ICD-10-CM | POA: Diagnosis not present

## 2022-04-16 DIAGNOSIS — M5441 Lumbago with sciatica, right side: Secondary | ICD-10-CM | POA: Diagnosis not present

## 2022-04-16 DIAGNOSIS — M5412 Radiculopathy, cervical region: Secondary | ICD-10-CM | POA: Diagnosis not present

## 2022-04-16 DIAGNOSIS — M9901 Segmental and somatic dysfunction of cervical region: Secondary | ICD-10-CM | POA: Diagnosis not present

## 2022-04-16 DIAGNOSIS — M9903 Segmental and somatic dysfunction of lumbar region: Secondary | ICD-10-CM | POA: Diagnosis not present

## 2022-04-19 DIAGNOSIS — H5203 Hypermetropia, bilateral: Secondary | ICD-10-CM | POA: Diagnosis not present

## 2022-04-22 DIAGNOSIS — M9903 Segmental and somatic dysfunction of lumbar region: Secondary | ICD-10-CM | POA: Diagnosis not present

## 2022-04-22 DIAGNOSIS — M5441 Lumbago with sciatica, right side: Secondary | ICD-10-CM | POA: Diagnosis not present

## 2022-04-22 DIAGNOSIS — M9901 Segmental and somatic dysfunction of cervical region: Secondary | ICD-10-CM | POA: Diagnosis not present

## 2022-04-22 DIAGNOSIS — M5412 Radiculopathy, cervical region: Secondary | ICD-10-CM | POA: Diagnosis not present

## 2022-04-23 DIAGNOSIS — M9901 Segmental and somatic dysfunction of cervical region: Secondary | ICD-10-CM | POA: Diagnosis not present

## 2022-04-23 DIAGNOSIS — M9903 Segmental and somatic dysfunction of lumbar region: Secondary | ICD-10-CM | POA: Diagnosis not present

## 2022-04-23 DIAGNOSIS — M5412 Radiculopathy, cervical region: Secondary | ICD-10-CM | POA: Diagnosis not present

## 2022-04-23 DIAGNOSIS — M5441 Lumbago with sciatica, right side: Secondary | ICD-10-CM | POA: Diagnosis not present

## 2022-04-29 DIAGNOSIS — M9903 Segmental and somatic dysfunction of lumbar region: Secondary | ICD-10-CM | POA: Diagnosis not present

## 2022-04-29 DIAGNOSIS — M5441 Lumbago with sciatica, right side: Secondary | ICD-10-CM | POA: Diagnosis not present

## 2022-04-29 DIAGNOSIS — M9901 Segmental and somatic dysfunction of cervical region: Secondary | ICD-10-CM | POA: Diagnosis not present

## 2022-04-29 DIAGNOSIS — M5412 Radiculopathy, cervical region: Secondary | ICD-10-CM | POA: Diagnosis not present

## 2022-04-30 DIAGNOSIS — M5441 Lumbago with sciatica, right side: Secondary | ICD-10-CM | POA: Diagnosis not present

## 2022-04-30 DIAGNOSIS — M9901 Segmental and somatic dysfunction of cervical region: Secondary | ICD-10-CM | POA: Diagnosis not present

## 2022-04-30 DIAGNOSIS — M9903 Segmental and somatic dysfunction of lumbar region: Secondary | ICD-10-CM | POA: Diagnosis not present

## 2022-04-30 DIAGNOSIS — M5412 Radiculopathy, cervical region: Secondary | ICD-10-CM | POA: Diagnosis not present

## 2022-05-04 DIAGNOSIS — M9901 Segmental and somatic dysfunction of cervical region: Secondary | ICD-10-CM | POA: Diagnosis not present

## 2022-05-04 DIAGNOSIS — M5441 Lumbago with sciatica, right side: Secondary | ICD-10-CM | POA: Diagnosis not present

## 2022-05-04 DIAGNOSIS — M9903 Segmental and somatic dysfunction of lumbar region: Secondary | ICD-10-CM | POA: Diagnosis not present

## 2022-05-04 DIAGNOSIS — M5412 Radiculopathy, cervical region: Secondary | ICD-10-CM | POA: Diagnosis not present

## 2022-05-06 DIAGNOSIS — M9901 Segmental and somatic dysfunction of cervical region: Secondary | ICD-10-CM | POA: Diagnosis not present

## 2022-05-06 DIAGNOSIS — M5441 Lumbago with sciatica, right side: Secondary | ICD-10-CM | POA: Diagnosis not present

## 2022-05-06 DIAGNOSIS — M5412 Radiculopathy, cervical region: Secondary | ICD-10-CM | POA: Diagnosis not present

## 2022-05-06 DIAGNOSIS — M9903 Segmental and somatic dysfunction of lumbar region: Secondary | ICD-10-CM | POA: Diagnosis not present

## 2022-05-11 DIAGNOSIS — M5412 Radiculopathy, cervical region: Secondary | ICD-10-CM | POA: Diagnosis not present

## 2022-05-11 DIAGNOSIS — M9901 Segmental and somatic dysfunction of cervical region: Secondary | ICD-10-CM | POA: Diagnosis not present

## 2022-05-11 DIAGNOSIS — M5441 Lumbago with sciatica, right side: Secondary | ICD-10-CM | POA: Diagnosis not present

## 2022-05-11 DIAGNOSIS — M9903 Segmental and somatic dysfunction of lumbar region: Secondary | ICD-10-CM | POA: Diagnosis not present

## 2022-05-13 DIAGNOSIS — M9903 Segmental and somatic dysfunction of lumbar region: Secondary | ICD-10-CM | POA: Diagnosis not present

## 2022-05-13 DIAGNOSIS — M5412 Radiculopathy, cervical region: Secondary | ICD-10-CM | POA: Diagnosis not present

## 2022-05-13 DIAGNOSIS — M5441 Lumbago with sciatica, right side: Secondary | ICD-10-CM | POA: Diagnosis not present

## 2022-05-13 DIAGNOSIS — M9901 Segmental and somatic dysfunction of cervical region: Secondary | ICD-10-CM | POA: Diagnosis not present

## 2022-05-17 ENCOUNTER — Emergency Department (HOSPITAL_COMMUNITY): Payer: Medicare Other

## 2022-05-17 ENCOUNTER — Encounter (HOSPITAL_COMMUNITY): Payer: Self-pay

## 2022-05-17 ENCOUNTER — Observation Stay (HOSPITAL_COMMUNITY)
Admission: EM | Admit: 2022-05-17 | Discharge: 2022-05-20 | Disposition: A | Discharge status: Home Health | Payer: Medicare Other | Attending: Student | Admitting: Student

## 2022-05-17 DIAGNOSIS — M179 Osteoarthritis of knee, unspecified: Secondary | ICD-10-CM | POA: Diagnosis not present

## 2022-05-17 DIAGNOSIS — R112 Nausea with vomiting, unspecified: Secondary | ICD-10-CM | POA: Insufficient documentation

## 2022-05-17 DIAGNOSIS — Z9189 Other specified personal risk factors, not elsewhere classified: Secondary | ICD-10-CM

## 2022-05-17 DIAGNOSIS — Z20822 Contact with and (suspected) exposure to covid-19: Secondary | ICD-10-CM | POA: Diagnosis present

## 2022-05-17 DIAGNOSIS — T426X5A Adverse effect of other antiepileptic and sedative-hypnotic drugs, initial encounter: Secondary | ICD-10-CM | POA: Diagnosis present

## 2022-05-17 DIAGNOSIS — R0689 Other abnormalities of breathing: Secondary | ICD-10-CM | POA: Diagnosis not present

## 2022-05-17 DIAGNOSIS — S43002A Unspecified subluxation of left shoulder joint, initial encounter: Secondary | ICD-10-CM | POA: Diagnosis not present

## 2022-05-17 DIAGNOSIS — D473 Essential (hemorrhagic) thrombocythemia: Secondary | ICD-10-CM | POA: Diagnosis present

## 2022-05-17 DIAGNOSIS — D709 Neutropenia, unspecified: Secondary | ICD-10-CM | POA: Diagnosis present

## 2022-05-17 DIAGNOSIS — E860 Dehydration: Secondary | ICD-10-CM

## 2022-05-17 DIAGNOSIS — R509 Fever, unspecified: Secondary | ICD-10-CM | POA: Diagnosis not present

## 2022-05-17 DIAGNOSIS — E785 Hyperlipidemia, unspecified: Secondary | ICD-10-CM | POA: Diagnosis present

## 2022-05-17 DIAGNOSIS — R32 Unspecified urinary incontinence: Secondary | ICD-10-CM | POA: Diagnosis not present

## 2022-05-17 DIAGNOSIS — R531 Weakness: Secondary | ICD-10-CM | POA: Insufficient documentation

## 2022-05-17 DIAGNOSIS — M5124 Other intervertebral disc displacement, thoracic region: Secondary | ICD-10-CM | POA: Diagnosis not present

## 2022-05-17 DIAGNOSIS — M4316 Spondylolisthesis, lumbar region: Secondary | ICD-10-CM | POA: Diagnosis not present

## 2022-05-17 DIAGNOSIS — E7220 Disorder of urea cycle metabolism, unspecified: Secondary | ICD-10-CM | POA: Diagnosis present

## 2022-05-17 DIAGNOSIS — Z7982 Long term (current) use of aspirin: Secondary | ICD-10-CM | POA: Diagnosis not present

## 2022-05-17 DIAGNOSIS — Z1152 Encounter for screening for COVID-19: Secondary | ICD-10-CM | POA: Diagnosis not present

## 2022-05-17 DIAGNOSIS — M25512 Pain in left shoulder: Secondary | ICD-10-CM | POA: Diagnosis present

## 2022-05-17 DIAGNOSIS — Z888 Allergy status to other drugs, medicaments and biological substances status: Secondary | ICD-10-CM

## 2022-05-17 DIAGNOSIS — I10 Essential (primary) hypertension: Secondary | ICD-10-CM | POA: Diagnosis present

## 2022-05-17 DIAGNOSIS — M48062 Spinal stenosis, lumbar region with neurogenic claudication: Secondary | ICD-10-CM | POA: Insufficient documentation

## 2022-05-17 DIAGNOSIS — N3090 Cystitis, unspecified without hematuria: Secondary | ICD-10-CM | POA: Diagnosis not present

## 2022-05-17 DIAGNOSIS — D7589 Other specified diseases of blood and blood-forming organs: Secondary | ICD-10-CM | POA: Diagnosis present

## 2022-05-17 DIAGNOSIS — W1789XA Other fall from one level to another, initial encounter: Secondary | ICD-10-CM | POA: Diagnosis present

## 2022-05-17 DIAGNOSIS — M159 Polyosteoarthritis, unspecified: Secondary | ICD-10-CM | POA: Diagnosis present

## 2022-05-17 DIAGNOSIS — N3000 Acute cystitis without hematuria: Secondary | ICD-10-CM

## 2022-05-17 DIAGNOSIS — M48061 Spinal stenosis, lumbar region without neurogenic claudication: Secondary | ICD-10-CM | POA: Diagnosis not present

## 2022-05-17 DIAGNOSIS — M5126 Other intervertebral disc displacement, lumbar region: Secondary | ICD-10-CM | POA: Diagnosis not present

## 2022-05-17 DIAGNOSIS — R110 Nausea: Secondary | ICD-10-CM | POA: Diagnosis not present

## 2022-05-17 DIAGNOSIS — Z79899 Other long term (current) drug therapy: Secondary | ICD-10-CM | POA: Insufficient documentation

## 2022-05-17 DIAGNOSIS — G9341 Metabolic encephalopathy: Secondary | ICD-10-CM | POA: Insufficient documentation

## 2022-05-17 DIAGNOSIS — Z96653 Presence of artificial knee joint, bilateral: Secondary | ICD-10-CM | POA: Diagnosis present

## 2022-05-17 DIAGNOSIS — R410 Disorientation, unspecified: Principal | ICD-10-CM

## 2022-05-17 DIAGNOSIS — R4182 Altered mental status, unspecified: Secondary | ICD-10-CM | POA: Diagnosis not present

## 2022-05-17 DIAGNOSIS — R17 Unspecified jaundice: Secondary | ICD-10-CM | POA: Diagnosis present

## 2022-05-17 DIAGNOSIS — Z87891 Personal history of nicotine dependence: Secondary | ICD-10-CM

## 2022-05-17 DIAGNOSIS — G629 Polyneuropathy, unspecified: Secondary | ICD-10-CM

## 2022-05-17 DIAGNOSIS — R29818 Other symptoms and signs involving the nervous system: Secondary | ICD-10-CM

## 2022-05-17 DIAGNOSIS — R300 Dysuria: Secondary | ICD-10-CM | POA: Diagnosis present

## 2022-05-17 DIAGNOSIS — R079 Chest pain, unspecified: Secondary | ICD-10-CM | POA: Diagnosis not present

## 2022-05-17 DIAGNOSIS — M19012 Primary osteoarthritis, left shoulder: Secondary | ICD-10-CM | POA: Diagnosis not present

## 2022-05-17 DIAGNOSIS — T428X5A Adverse effect of antiparkinsonism drugs and other central muscle-tone depressants, initial encounter: Secondary | ICD-10-CM | POA: Diagnosis present

## 2022-05-17 LAB — URINALYSIS, ROUTINE W REFLEX MICROSCOPIC
Bacteria, UA: NONE SEEN
Bilirubin Urine: NEGATIVE
Glucose, UA: NEGATIVE mg/dL
Ketones, ur: 5 mg/dL — AB
Leukocytes,Ua: NEGATIVE
Nitrite: NEGATIVE
Protein, ur: 30 mg/dL — AB
Specific Gravity, Urine: 1.017 (ref 1.005–1.030)
pH: 6 (ref 5.0–8.0)

## 2022-05-17 LAB — RESP PANEL BY RT-PCR (FLU A&B, COVID) ARPGX2
Influenza A by PCR: NEGATIVE
Influenza B by PCR: NEGATIVE
SARS Coronavirus 2 by RT PCR: NEGATIVE

## 2022-05-17 LAB — RAPID URINE DRUG SCREEN, HOSP PERFORMED
Amphetamines: NOT DETECTED
Barbiturates: NOT DETECTED
Benzodiazepines: NOT DETECTED
Cocaine: NOT DETECTED
Opiates: NOT DETECTED
Tetrahydrocannabinol: NOT DETECTED

## 2022-05-17 LAB — PROTIME-INR
INR: 1.1 (ref 0.8–1.2)
Prothrombin Time: 14 seconds (ref 11.4–15.2)

## 2022-05-17 LAB — COMPREHENSIVE METABOLIC PANEL
ALT: 28 U/L (ref 0–44)
AST: 45 U/L — ABNORMAL HIGH (ref 15–41)
Albumin: 4.5 g/dL (ref 3.5–5.0)
Alkaline Phosphatase: 48 U/L (ref 38–126)
Anion gap: 10 (ref 5–15)
BUN: 17 mg/dL (ref 8–23)
CO2: 22 mmol/L (ref 22–32)
Calcium: 8.9 mg/dL (ref 8.9–10.3)
Chloride: 104 mmol/L (ref 98–111)
Creatinine, Ser: 0.76 mg/dL (ref 0.61–1.24)
GFR, Estimated: >60 mL/min (ref >60)
Glucose, Bld: 111 mg/dL — ABNORMAL HIGH (ref 70–99)
Potassium: 4 mmol/L (ref 3.5–5.1)
Sodium: 136 mmol/L (ref 135–145)
Total Bilirubin: 1.2 mg/dL (ref 0.3–1.2)
Total Protein: 7.6 g/dL (ref 6.5–8.1)

## 2022-05-17 LAB — APTT: aPTT: 28 seconds (ref 24–36)

## 2022-05-17 LAB — LACTIC ACID, PLASMA: Lactic Acid, Venous: 1.2 mmol/L (ref 0.5–1.9)

## 2022-05-17 LAB — AMMONIA: Ammonia: 43 umol/L — ABNORMAL HIGH (ref 9–35)

## 2022-05-17 LAB — TROPONIN I (HIGH SENSITIVITY): Troponin I (High Sensitivity): 8 ng/L (ref <18)

## 2022-05-17 MED ORDER — THIAMINE HCL 100 MG/ML IJ SOLN
500.0000 mg | Freq: Three times a day (TID) | INTRAVENOUS | Status: DC
  Filled 2022-05-17: qty 5

## 2022-05-17 MED ORDER — ENOXAPARIN SODIUM 40 MG/0.4ML IJ SOSY
40.0000 mg | PREFILLED_SYRINGE | INTRAMUSCULAR | Status: DC
  Administered 2022-05-17 – 2022-05-19 (×3): 40 mg via SUBCUTANEOUS
  Filled 2022-05-17 (×3): qty 0.4

## 2022-05-17 MED ORDER — ACETAMINOPHEN 325 MG PO TABS: 650.0000 mg | ORAL_TABLET | Freq: Four times a day (QID) | ORAL | Status: DC | PRN

## 2022-05-17 MED ORDER — ONDANSETRON HCL 4 MG/2ML IJ SOLN
4.0000 mg | Freq: Four times a day (QID) | INTRAMUSCULAR | Status: DC | PRN
Start: 2022-05-17 — End: 2022-05-20
  Administered 2022-05-18: 4 mg via INTRAVENOUS
  Filled 2022-05-17: qty 2

## 2022-05-17 MED ORDER — GADOBUTROL 1 MMOL/ML IV SOLN: 10.0000 mL | Freq: Once | INTRAVENOUS | Status: DC | PRN

## 2022-05-17 MED ORDER — LORAZEPAM 2 MG/ML IJ SOLN
1.0000 mg | INTRAMUSCULAR | Status: DC | PRN
  Administered 2022-05-17: 1 mg via INTRAVENOUS
  Filled 2022-05-17: qty 1

## 2022-05-17 MED ORDER — POLYETHYLENE GLYCOL 3350 17 G PO PACK: 17.0000 g | PACK | Freq: Every day | ORAL | Status: DC | PRN

## 2022-05-17 MED ORDER — THIAMINE HCL 100 MG PO TABS: 100.0000 mg | ORAL_TABLET | Freq: Every day | ORAL | Status: DC

## 2022-05-17 MED ORDER — HYDROMORPHONE HCL 1 MG/ML IJ SOLN
1.0000 mg | Freq: Once | INTRAMUSCULAR | Status: AC
  Administered 2022-05-17: 1 mg via INTRAVENOUS
  Filled 2022-05-17: qty 1

## 2022-05-17 MED ORDER — HYDROXYUREA 500 MG PO CAPS: 1000.0000 mg | ORAL_CAPSULE | Freq: Every day | ORAL | Status: DC

## 2022-05-17 MED ORDER — ATORVASTATIN CALCIUM 40 MG PO TABS
40.0000 mg | ORAL_TABLET | Freq: Every day | ORAL | Status: DC
  Administered 2022-05-18 – 2022-05-20 (×3): 40 mg via ORAL
  Filled 2022-05-17 (×3): qty 1

## 2022-05-17 MED ORDER — LACTATED RINGERS IV SOLN: INTRAVENOUS | Status: AC

## 2022-05-17 MED ORDER — THIAMINE HCL 100 MG/ML IJ SOLN: 100.0000 mg | Freq: Every day | INTRAMUSCULAR | Status: DC

## 2022-05-17 MED ORDER — ACETAMINOPHEN 500 MG PO TABS
1000.0000 mg | ORAL_TABLET | Freq: Once | ORAL | Status: AC
Start: 2022-05-17 — End: 2022-05-17
  Administered 2022-05-17: 1000 mg via ORAL
  Filled 2022-05-17: qty 2

## 2022-05-17 MED ORDER — ADULT MULTIVITAMIN W/MINERALS CH
1.0000 | ORAL_TABLET | Freq: Every day | ORAL | Status: DC
  Administered 2022-05-17 – 2022-05-20 (×4): 1 via ORAL
  Filled 2022-05-17 (×4): qty 1

## 2022-05-17 MED ORDER — THIAMINE HCL 100 MG/ML IJ SOLN: 500.0000 mg | Freq: Every day | INTRAVENOUS | Status: DC

## 2022-05-17 MED ORDER — HYDROMORPHONE HCL 1 MG/ML IJ SOLN: 0.5000 mg | INTRAMUSCULAR | Status: DC | PRN

## 2022-05-17 MED ORDER — IRBESARTAN 75 MG PO TABS: 75.0000 mg | ORAL_TABLET | Freq: Every day | ORAL | Status: DC

## 2022-05-17 MED ORDER — LIDOCAINE HCL 2 % IJ SOLN: 10.0000 mL | Freq: Once | INTRAMUSCULAR | Status: DC

## 2022-05-17 MED ORDER — OXYCODONE HCL 5 MG PO TABS: 5.0000 mg | ORAL_TABLET | Freq: Four times a day (QID) | ORAL | Status: DC | PRN

## 2022-05-17 MED ORDER — LORAZEPAM 0.5 MG PO TABS: 0.5000 mg | ORAL_TABLET | ORAL | Status: DC | PRN

## 2022-05-17 MED ORDER — FOLIC ACID 1 MG PO TABS
1.0000 mg | ORAL_TABLET | Freq: Every day | ORAL | Status: DC
  Administered 2022-05-17: 1 mg via ORAL
  Filled 2022-05-17: qty 1

## 2022-05-17 MED ORDER — MELATONIN 5 MG PO TABS
5.0000 mg | ORAL_TABLET | Freq: Every evening | ORAL | Status: DC | PRN
  Administered 2022-05-19: 5 mg via ORAL
  Filled 2022-05-17 (×2): qty 1

## 2022-05-17 MED ORDER — LORAZEPAM 2 MG/ML IJ SOLN: 0.5000 mg | INTRAMUSCULAR | Status: DC | PRN

## 2022-05-17 NOTE — Progress Notes (Signed)
Orthopedic Tech Progress Note Patient Details:  Jordan Vazquez [birth year] [id number]  Ortho Devices Type of Ortho Device: Sling immobilizer Ortho Device/Splint Location: left Ortho Device/Splint Interventions: Application   Post Interventions Patient Tolerated: Well Instructions Provided: Care of device  [place] Pink 05/17/2022, 5:25 PM

## 2022-05-17 NOTE — ED Provider Notes (Signed)
  Physical Exam  BP (!) 149/98   Pulse 93   Temp 98.7 F (37.1 C) (Oral)   Resp 18   SpO2 90%   Physical Exam  Procedures  Procedures  ED Course / MDM   Clinical Course as of 05/17/22 1951  Mon May 17, 2022  1308 Temp(!): 101.9 F (38.8 C) [JL]    Clinical Course User Index [JL] Regan Lemming, MD   Medical Decision Making Care assumed at 4:30 pm.  Patient is here with a fever and weakness and back pain and incontinence.  He had a constellation of symptoms and had a fall 2 days ago. He woke up with a fever today and had difficulty walking.  Sepsis work-up initiated and white blood cell count and lactate and urinalysis were normal.  Signout pending COVID test and MRI of the thoracic lumbar and cervical spine.  6 pm Patient's COVID test is negative.  Patient unable to complete all his MRIs and just got the thoracic and lumbar without contrast.  There is multilevel disease but no obvious spinal cord compression.  We will consult neurosurgery.  7:55 PM I talked to Maudie Mercury from neurosurgery. She states that she does not recommend any neurosurgical intervention.  I had extensive discussion regarding lumbar puncture.  At this point, my differential is hepatic encephalopathy versus delirium with URI.  Patient has no obvious source of infection right now. Patient is afebrile now.  My plan was initially to do lumbar puncture and start broad-spectrum antibiotics and also acyclovir but patient wants to hold off.  I talked to the hospitalist and patient will be admitted for observation and we will hold off on antibiotics and acyclovir for now.   Amount and/or Complexity of Data Reviewed Labs: ordered. Decision-making details documented in ED Course. Radiology: ordered and independent interpretation performed. Decision-making details documented in ED Course. ECG/medicine tests: ordered.  Risk OTC drugs. Prescription drug management. Decision regarding hospitalization.          Drenda Freeze, MD 05/17/22 [phone]

## 2022-05-17 NOTE — H&P (Addendum)
History and Physical  Jordan Vazquez CNO:[id number] DOB: 04-21-1946 DOA: 05/17/2022  Referring physician: Dr. Darl Householder, EDP  PCP: [name], MD  Outpatient Specialists: Hematology oncology Patient coming from: Home, lives with his wife.  Chief Complaint: Altered mental status.  HPI: Jordan Vazquez is a 76 y.o. male with medical history significant for essential thrombocytosis, follows with hematology oncology Dr. 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  The patient fell at home 2 days prior to this presentation.  He incurred left shoulder pain after his fall.  Today he woke up with a subjective fever 102 and was very confused.  His wife brought him to the ED for further evaluation.  In the ED, the patient is febrile with Tmax 101.9.  Lactic acid normal.  No leukocytosis.  His mentation waxes and wanes.  AST elevated 45.  Ammonia level mildly elevated at 43.  Noncontrast CT head nonacute.  UA and chest x-ray nonacute.  MRI thoracic spine and lumbar spine nonacute.  MRI cervical spine with and without contrast pending.  The patient received IV analgesics in the ED 1 mg IV Dilaudid x1 and p.o. Tylenol 1 g x 1.  While in the ED his confusion waxes and wanes.  The patient declines LP by EDP, states he is open to LP tomorrow.  Antibiotics were held.  TRH, hospitalist service, was asked to admit for further work-up and management of his delirium.  He is alert and oriented x3 at the time of the visit.  Nauseous and vomiting in the room.  Denies emesis prior to presentation.  Denies abdominal pain.  No abdominal tenderness with palpation on exam.  ED Course: Tmax 100.9.  BP 161/99, pulse 84, respiration rate 18, saturation 90% on room air.  Lab studies significant for AST 45 with ALT of 28.  Ammonia 43.  Review of  Systems: Review of systems as noted in the HPI. All other systems reviewed and are negative.   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    Hypertension    Past Surgical History:  Procedure Laterality Date   CARDIAC CATHETERIZATION     16 years ago negative    HERNIA REPAIR     right inguinal hernia surgery    I & D KNEE WITH POLY EXCHANGE  12/06/2011   Procedure: IRRIGATION AND DEBRIDEMENT KNEE WITH POLY EXCHANGE;  Surgeon: Mauri Pole, MD;  Location: [facility] ORS;  Service: Orthopedics;  Laterality: Right;  Right Total Knee Irrigation and Debridement with Poly Exchange   JOINT REPLACEMENT     left knee 5/12, right TKA 10/12     Social History:  reports that he quit smoking about 58 years ago. His smoking use included cigarettes. He smoked an average of .25 packs per day. He has never used smokeless tobacco. He reports current alcohol use of about 2.0 standard drinks of alcohol per week. He reports that he does not use drugs.   Allergies  Allergen Reactions   Niacin     Other reaction(s): burning all over feeling   Niacin And Related Itching   Duloxetine Hcl Rash   Simvastatin Rash    Family history: Maternal grandfather history  of heart disease.  Prior to Admission medications   Medication Sig Start Date End Date Taking? Authorizing Provider  aspirin 325 MG tablet Take 325 mg by mouth daily.     [provider]  atorvastatin (LIPITOR) 40 MG tablet Take 40 mg by mouth every morning.    [provider]  baclofen (LIORESAL) 10 MG tablet TAKE 1/2 TABLET BY MOUTH QID AS NEEDED 03/21/17   [provider]  Cholecalciferol (VITAMIN D-3) 1000 UNITS CAPS Take 1,000 Units by mouth daily.    [provider]  gabapentin (NEURONTIN) 300 MG capsule Take 300 mg by mouth 3 (three) times  daily. 09/16/20   [provider]  gabapentin (NEURONTIN) 800 MG tablet Take 800 mg by mouth 3 (three) times daily.    [provider]  hydrochlorothiazide (HYDRODIURIL) 12.5 MG tablet Take 12.5 mg by mouth every morning. 09/08/20   [provider]  hydroxyurea (HYDREA) 500 MG capsule TAKE 2 CAPSULES(1000 MG) BY MOUTH DAILY WITH FOOD 03/08/22   Brunetta Genera, MD  Multiple Vitamin (MULITIVITAMIN WITH MINERALS) TABS Take 2 tablets by mouth daily.     [provider]  Omega-3 Fatty Acids (SEA-OMEGA 30 PO) Take 1 capsule by mouth daily.    [provider]  oxyCODONE (OXY IR/ROXICODONE) 5 MG immediate release tablet  08/16/15   [provider]  telmisartan (MICARDIS) 40 MG tablet Take 40 mg by mouth daily. 05/18/21   [provider]    Physical Exam: BP (!) 149/98   Pulse 93   Temp 98.7 F (37.1 C) (Oral)   Resp 18   SpO2 90%   General: 76 y.o. year-old male well developed well nourished in no acute distress.  Alert and oriented x3. Cardiovascular: Regular rate and rhythm with no rubs or gallops.  No thyromegaly or JVD noted.  No lower extremity edema. 2/4 pulses in all 4 extremities. Respiratory: Clear to auscultation with no wheezes or rales. Good inspiratory effort. Abdomen: Soft nontender nondistended with normal bowel sounds x4 quadrants. Muskuloskeletal: No cyanosis, clubbing or edema noted bilaterally Neuro: CN II-XII intact, strength, sensation, reflexes Skin: No ulcerative lesions noted or rashes Psychiatry: Judgement and insight appear normal. Mood is appropriate for condition and setting          Labs on Admission:  Basic Metabolic Panel: Recent Labs  Lab 05/17/22 1243  NA 136  K 4.0  CL 104  CO2 22  GLUCOSE 111*  BUN 17  CREATININE 0.76  CALCIUM 8.9   Liver Function Tests: Recent Labs  Lab 05/17/22 1243  AST 45*  ALT 28  ALKPHOS 48  BILITOT 1.2  PROT 7.6  ALBUMIN 4.5   No results for  input(s): "LIPASE", "AMYLASE" in the last 168 hours. Recent Labs  Lab 05/17/22 1243  AMMONIA 43*   CBC: No results for input(s): "WBC", "NEUTROABS", "HGB", "HCT", "MCV", "PLT" in the last 168 hours. Cardiac Enzymes: No results for input(s): "CKTOTAL", "CKMB", "CKMBINDEX", "TROPONINI" in the last 168 hours.  BNP (last 3 results) No results for input(s): "BNP" in the last 8760 hours.  ProBNP (last 3 results) No results for input(s): "PROBNP" in the last 8760 hours.  CBG: No results for input(s): "GLUCAP" in the last 168 hours.  Radiological Exams on Admission: MR THORACIC SPINE WO CONTRAST  Result Date: 05/17/2022 CLINICAL DATA:  Back pain.  Neurological deficit EXAM: MRI THORACIC SPINE WITHOUT CONTRAST TECHNIQUE: Multiplanar, multisequence MR imaging of the thoracic spine was performed. No intravenous contrast was  administered. COMPARISON:  No prior thoracic imaging. FINDINGS: Alignment:  No malalignment. Vertebrae: No fracture or focal bone lesion. Cord:  No cord compression or focal cord lesion. Paraspinal and other soft tissues: No significant paravertebral finding. Disc levels: Bulging of the discs at T1-2, T2-3, T3-4, T5-6, T6-7, T9-10, T10-11 and T11-12, narrowing the ventral subarachnoid space but not causing cord compression. Mild foraminal stenosis present at T1-2, T2-3 and T3-4. Other foramina appear widely patent. IMPRESSION: No advanced finding. Disc bulges at multiple thoracic levels as outlined above, but no apparent compressive stenosis of the central canal. Mild bilateral foraminal narrowing at T1-2, T2-3 and T3-4. Electronically Signed   By: Nelson Chimes M.D.   On: 05/17/2022 17:16   MR LUMBAR SPINE WO CONTRAST  Result Date: 05/17/2022 CLINICAL DATA:  Low back pain. Cauda equina syndrome suspected. Question epidural abscess. EXAM: MRI LUMBAR SPINE WITHOUT CONTRAST TECHNIQUE: Multiplanar, multisequence MR imaging of the lumbar spine was performed. No intravenous contrast was  administered. COMPARISON:  08/05/2019 FINDINGS: Segmentation:  5 lumbar type vertebral bodies. Alignment: Chronic degenerative anterolisthesis at L4-5 measuring 6-7 mm. 3 mm retrolisthesis L1-2. Vertebrae:  No fracture or focal bone lesion. Conus medullaris and cauda equina: Conus extends to the T12-L1 level. Conus and cauda equina appear normal. Paraspinal and other soft tissues: Negative Disc levels: T12-L1: Mild bulging of the disc.  No compressive stenosis. L1-2: 3 mm retrolisthesis. Mild stenosis of both lateral recesses but no definite neural compression. L2-3: Bulging of the disc more prominent towards the left. Mild stenosis of the left lateral recess could possibly affect the left L3 nerve. Similar appearance to the prior exam. L3-4: Mild bulging of the disc. Facet and ligamentous hypertrophy. No compressive stenosis. L4-5: Chronic facet arthropathy with 6-7 mm of anterolisthesis. Bulging of the disc. Very severe multifactorial spinal stenosis likely to be symptomatic. Similar appearance to the study of 2020. L5-S1: Mild bulging of the disc. Bilateral facet degeneration and hypertrophy. Bilateral subarticular lateral recess stenosis with some potential to affect the S1 nerves. Similar appearance to the prior study. 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 L5-S1 bilateral subarticular lateral recess stenosis that could possibly affect either S1 nerve, similar to the examination of September 2020. Left lateral recess stenosis at L2-3 that could possibly affect the left L3 nerve, similar to the study of September 2020. Lesser, grossly non-compressive degenerative changes at the other levels as above. Electronically Signed   By: Nelson Chimes M.D.   On: 05/17/2022 17:13   CT HEAD WO CONTRAST (5MM)  Result Date: 05/17/2022 CLINICAL DATA:  Mental status change, unknown cause. EXAM: CT HEAD WITHOUT CONTRAST TECHNIQUE: Contiguous axial images were  obtained from the base of the skull through the vertex without intravenous contrast. RADIATION DOSE REDUCTION: This exam was performed according to the departmental dose-optimization program which includes automated exposure control, adjustment of the mA and/or kV according to patient size and/or use of iterative reconstruction technique. COMPARISON:  CT head 02/25/2010. FINDINGS: Brain: Mild generalized volume loss, within normal limits for patient age, and associated dilatation of the ventricles. Ventricular size is unchanged compared to 02/25/2010. No evidence of acute infarction, hemorrhage, hydrocephalus, extra-axial collection or mass lesion/mass effect. Vascular: No hyperdense vessel or unexpected calcification. Skull: Normal. Negative for fracture or focal lesion. Sinuses/Orbits: No acute finding. Trace mucosal thickening in the right frontal sinus. The other paranasal sinuses and mastoid air cells are clear. Other: None. IMPRESSION: No acute intracranial abnormality. Electronically Signed  By: Ileana Roup M.D.   On: 05/17/2022 13:54   DG Shoulder Left  Result Date: 05/17/2022 CLINICAL DATA:  Left shoulder pain, fall, initial encounter. EXAM: LEFT SHOULDER - 2+ VIEW COMPARISON:  None Available. FINDINGS: Slight superior subluxation of the lateral left clavicle with respect to the acromion, with moderate degenerative changes. No fracture. Visualized chest is unremarkable. IMPRESSION: 1. Minimal superior subluxation of the lateral left clavicle with respect to the acromion, of uncertain acuity. There are fairly extensive degenerative changes in the acromioclavicular joint and this may be chronic. Please correlate clinically. 2. Otherwise, no acute findings. Electronically Signed   By: Lorin Picket M.D.   On: 05/17/2022 13:41   DG Chest Port 1 View  Result Date: 05/17/2022 CLINICAL DATA:  Provided history: Questionable sepsis-evaluate for abnormality. Weakness, altered mental status, shoulder pain.  EXAM: PORTABLE CHEST 1 VIEW COMPARISON:  Prior chest radiographs 04/01/2011 and earlier. FINDINGS: Borderline cardiomegaly. Aortic atherosclerosis. No appreciable airspace consolidation or pulmonary edema. No evidence of pleural effusion or pneumothorax. No acute bony abnormality identified. Degenerative changes of the spine. IMPRESSION: No evidence of acute cardiopulmonary abnormality. Borderline cardiomegaly. Aortic Atherosclerosis (ICD10-I70.0). Electronically Signed   By: Kellie Simmering D.O.   On: 05/17/2022 13:39    EKG: I independently viewed the EKG done and my findings are as followed: A-fib rate of 82.  Nonspecific ST-T changes.  QTc 399.  Assessment/Plan Present on Admission:  AMS (altered mental status)  Principal Problem:   AMS (altered mental status)  Acute metabolic encephalopathy of unclear etiology. Delirium in the ED with confusion waxing and waning. Tmax 101.9 with unclear source of infection. No leukocytosis, lactic acid negative. CT head nonacute UA, chest x-ray nonacute Start delirium precautions Regular sleep and wake cycle. Gentle IV fluid hydration Monitor cultures Monitor fever curve and WBC  Fever of unclear etiology Patient declines LP in the ED He is receptive to trying LP in the morning N.p.o. after midnight Holding off antibiotics for now Monitor fever curve and WBC  Nausea and vomiting in the ED x1 Unclear etiology IV antiemetics as needed Gentle IV fluid LR 50 cc/h to avoid dehydration  Isolated elevated AST Mild hyperammonemia 43 The patient denies recent use of alcohol. States he has not had any alcoholic drinks in years Obtain UDS, Etoh level Continue multivitamins.  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 His MRI shows severe spinal stenosis at L4-L5 with anterolisthesis which is chronic in nature and not acute per neurosurgery. Pain  control and bowel regimen PT OT to assess Fall precautions  Essential hypertension, BP is not at goal, elevated Resume home oral antihypertensives Monitor vital signs  Polyneuropathy Resume home Neurontin  Essential thrombocytosis Resume home hydroxyurea  Hyperlipidemia Resume home Lipitor   DVT prophylaxis: Subcu Lovenox daily  Code Status: Full code  Family Communication: His wife at bedside  Disposition Plan: Admitted to telemetry unit  Consults called: None.  Admission status: Observation status   Status is: Observation    Kayleen Memos MD [facility] Pager [phone]  If 7PM-7AM, please contact night-coverage [email or web address] Password [facility]  05/17/2022, 9:34 PM

## 2022-05-17 NOTE — ED Provider Notes (Signed)
[place] DEPT Provider Note   CSN: [id number] Arrival date & time: 05/17/22  1216     History  Chief Complaint  Patient presents with   Altered Mental Status   Shoulder Pain    Jordan Vazquez is a 76 y.o. male.   Altered Mental Status Shoulder Pain   76 year old male presenting to the emergency department with a chief complaint of fever, generalized weakness, worsening bilateral lower extremity weakness, new incontinence and left shoulder pain.  The patient fell out of a car 2 days ago and has had some pain in his left shoulder.  He endorses difficulty walking that is chronic due to chronic severe spinal canal stenosis but states that symptoms have worsened over the last few days and his urinary continence is new.  He continues to endorse pain with range of motion of his left shoulder after his fall.  Earlier today, he had a transient episode of confusion according to his wife but has returned back to his baseline mental status.  His fever was 102 at the time of confusion.  Due to this confusion, his wife brought him to the emergency department for further evaluation.  He denies any chest pain, shortness of breath, abdominal pain, dysuria.  He denies any neck stiffness.  He denies a headache.  Home Medications Prior to Admission medications   Medication Sig Start Date End Date Taking? Authorizing Provider  aspirin 325 MG tablet Take 325 mg by mouth daily.     [provider]  atorvastatin (LIPITOR) 40 MG tablet Take 40 mg by mouth every morning.    [provider]  baclofen (LIORESAL) 10 MG tablet TAKE 1/2 TABLET BY MOUTH QID AS NEEDED 03/21/17   [provider]  Cholecalciferol (VITAMIN D-3) 1000 UNITS CAPS Take 1,000 Units by mouth daily.    [provider]  gabapentin (NEURONTIN) 300 MG capsule Take 300 mg by mouth 3 (three) times daily. 09/16/20   [provider]  gabapentin (NEURONTIN) 800 MG tablet Take 800  mg by mouth 3 (three) times daily.    [provider]  hydrochlorothiazide (HYDRODIURIL) 12.5 MG tablet Take 12.5 mg by mouth every morning. 09/08/20   [provider]  hydroxyurea (HYDREA) 500 MG capsule TAKE 2 CAPSULES(1000 MG) BY MOUTH DAILY WITH FOOD 03/08/22   Brunetta Genera, MD  Multiple Vitamin (MULITIVITAMIN WITH MINERALS) TABS Take 2 tablets by mouth daily.     [provider]  Omega-3 Fatty Acids (SEA-OMEGA 30 PO) Take 1 capsule by mouth daily.    [provider]  oxyCODONE (OXY IR/ROXICODONE) 5 MG immediate release tablet  08/16/15   [provider]  telmisartan (MICARDIS) 40 MG tablet Take 40 mg by mouth daily. 05/18/21   [provider]      Allergies    Niacin, Niacin and related, Duloxetine hcl, and Simvastatin    Review of Systems   Review of Systems  All other systems reviewed and are negative.   Physical Exam Updated Vital Signs BP 128/72 (BP Location: Right Arm)   Pulse 85   Temp (!) 101.9 F (38.8 C) (Oral)   Resp 14   SpO2 96%  Physical Exam Vitals and nursing note reviewed.  Constitutional:      General: He is not in acute distress.    Appearance: He is well-developed.     Comments: GCS 15, ABC intact  HENT:     Head: Normocephalic and atraumatic.  Eyes:  Conjunctiva/sclera: Conjunctivae normal.  Neck:     Comments: No meningismus, no midline tenderness Cardiovascular:     Rate and Rhythm: Normal rate and regular rhythm.     Comments: 2+ radial pulses bilaterally Pulmonary:     Effort: Pulmonary effort is normal. No respiratory distress.     Breath sounds: Normal breath sounds.  Abdominal:     Palpations: Abdomen is soft.     Tenderness: There is no abdominal tenderness.  Musculoskeletal:        General: No swelling.     Cervical back: Normal range of motion and neck supple.     Comments: No significant tenderness to palpation of the thoracic or lumbar spine, tenderness to palpation of  the left shoulder with pain with range of motion, tenderness about the [facility] joint  Skin:    General: Skin is warm and dry.     Capillary Refill: Capillary refill takes less than 2 seconds.  Neurological:     Mental Status: He is alert and oriented to person, place, and time. Mental status is at baseline.     Cranial Nerves: No cranial nerve deficit.     Motor: Weakness present.     Comments: 4-5 strength in the bilateral lower extremities, 5 out of 5 strength in the upper extremities  Psychiatric:        Mood and Affect: Mood normal.     ED Results / Procedures / Treatments   Labs (all labs ordered are listed, but only abnormal results are displayed) Labs Reviewed - No data to display  EKG None  Radiology No results found.  Procedures Procedures    Medications Ordered in ED Medications - No data to display  ED Course/ Medical Decision Making/ A&P Clinical Course as of 05/17/22 2155  Mon May 17, 2022  1308 Temp(!): 101.9 F (38.8 C) [JL]    Clinical Course User Index [JL] Regan Lemming, MD                           Medical Decision Making Amount and/or Complexity of Data Reviewed Labs: ordered. Radiology: ordered. ECG/medicine tests: ordered.  Risk OTC drugs. Prescription drug management. Decision regarding hospitalization.    76 year old male presenting to the emergency department with a chief complaint of fever, generalized weakness, worsening bilateral lower extremity weakness, new incontinence and left shoulder pain.  The patient fell out of a car 2 days ago and has had some pain in his left shoulder.  He endorses difficulty walking that is chronic due to chronic severe spinal canal stenosis but states that symptoms have worsened over the last few days and his urinary continence is new.  He continues to endorse pain with range of motion of his left shoulder after his fall.  Earlier today, he had a transient episode of confusion according to his wife but has  returned back to his baseline mental status.  His fever was 102 at the time of confusion.  Due to this confusion, his wife brought him to the emergency department for further evaluation.  He denies any chest pain, shortness of breath, abdominal pain, dysuria.  He denies any neck stiffness.  He denies a headache.  On arrival, the patient was febrile to 101.9.  He does not meet any other SIRS criteria on arrival with a pulse of 85.  He is hemodynamically stable BP 128/72.  He is saturating 98% on room air and is not tachypneic.  NSR noted  on cardiac telemetry.  Physical exam concerning for a neurologic exam with 4-5 strength in the bilateral lower extremities.  As the patient is febrile, endorsing acute on chronic back pain with new/worsening incontinence, concern for possible epidural abscess.  Additionally, concern for possible shoulder fracture, AC separation, dislocation.  Will obtain x-ray imaging of the shoulder to further evaluate.  Infectious work-up and altered mental status work-up initiated to include CT head.  CT head without acute intracranial abnormality.  Remainder of laboratory work-up significant for urinalysis with moderate hemoglobin, negative for evidence of UTI, mildly elevated ammonia to 43, CMP without significant electrolyte abnormality, normal renal and liver function, COVID-19 and influenza PCR testing negative, urine culture and blood cultures collected and pending.  MRI imaging of the spine was ordered given the patient's worsening incontinence in the setting of known spinal stenosis but new fever, recent fall, plan to evaluate for spinal cord compression syndrome.  Plan at time of signout to follow-up MRI imaging results, reassess the patient's mental status and follow-up lab and imaging.  Plan for likely admission in the setting of the patient's worsening symptoms.  Signout given to Dr. Darl Householder at [phone].   Final Clinical Impression(s) / ED Diagnoses Final diagnoses:  None    Rx /  DC Orders ED Discharge Orders     None         Regan Lemming, MD 05/17/22 2201

## 2022-05-17 NOTE — Progress Notes (Addendum)
Called in regards to this patient's MRI. It was reported to me that he came in with confusion and fever. His wife states he hasnt been acting himself. It was reported that he had some incontinence. He was seen by Dr. Kathyrn Sheriff in April and was sent for physical therapy for back and leg pain. His MRI does show severe spinal stenosis at L4-5 with anterolisthesis. This is chronic in nature and not acute. He will needs admission for further workup of his confusion and fever. Do not recommend neurosurgical intervention at this point in time in the setting of unknown origin of fever and confusion. Recommend pain control for now. Will make Dr. Kathyrn Sheriff aware of patients admission.

## 2022-05-17 NOTE — ED Triage Notes (Addendum)
Pt c/o left shoulder pain x 1 day. Denies injury. Wife stated to EMS that patient "wasn't acting himself". Pt A&O x 4 at this time. EMS gave pt 4 mg zofran and 250 ml of NS.

## 2022-05-18 ENCOUNTER — Other Ambulatory Visit: Payer: Self-pay

## 2022-05-18 DIAGNOSIS — E7220 Disorder of urea cycle metabolism, unspecified: Secondary | ICD-10-CM | POA: Diagnosis present

## 2022-05-18 DIAGNOSIS — R32 Unspecified urinary incontinence: Secondary | ICD-10-CM | POA: Diagnosis present

## 2022-05-18 DIAGNOSIS — T428X5A Adverse effect of antiparkinsonism drugs and other central muscle-tone depressants, initial encounter: Secondary | ICD-10-CM | POA: Diagnosis present

## 2022-05-18 DIAGNOSIS — G9341 Metabolic encephalopathy: Secondary | ICD-10-CM | POA: Diagnosis not present

## 2022-05-18 DIAGNOSIS — R4182 Altered mental status, unspecified: Secondary | ICD-10-CM | POA: Diagnosis not present

## 2022-05-18 DIAGNOSIS — R300 Dysuria: Secondary | ICD-10-CM | POA: Diagnosis present

## 2022-05-18 DIAGNOSIS — Z79899 Other long term (current) drug therapy: Secondary | ICD-10-CM | POA: Diagnosis not present

## 2022-05-18 DIAGNOSIS — Z20822 Contact with and (suspected) exposure to covid-19: Secondary | ICD-10-CM | POA: Diagnosis present

## 2022-05-18 DIAGNOSIS — N3001 Acute cystitis with hematuria: Secondary | ICD-10-CM | POA: Diagnosis not present

## 2022-05-18 DIAGNOSIS — D7589 Other specified diseases of blood and blood-forming organs: Secondary | ICD-10-CM | POA: Diagnosis present

## 2022-05-18 DIAGNOSIS — E860 Dehydration: Secondary | ICD-10-CM

## 2022-05-18 DIAGNOSIS — R17 Unspecified jaundice: Secondary | ICD-10-CM | POA: Diagnosis present

## 2022-05-18 DIAGNOSIS — I10 Essential (primary) hypertension: Secondary | ICD-10-CM | POA: Diagnosis present

## 2022-05-18 DIAGNOSIS — E785 Hyperlipidemia, unspecified: Secondary | ICD-10-CM | POA: Diagnosis present

## 2022-05-18 DIAGNOSIS — Z7982 Long term (current) use of aspirin: Secondary | ICD-10-CM | POA: Diagnosis not present

## 2022-05-18 DIAGNOSIS — D473 Essential (hemorrhagic) thrombocythemia: Secondary | ICD-10-CM | POA: Diagnosis present

## 2022-05-18 DIAGNOSIS — R509 Fever, unspecified: Secondary | ICD-10-CM | POA: Diagnosis not present

## 2022-05-18 DIAGNOSIS — G629 Polyneuropathy, unspecified: Secondary | ICD-10-CM | POA: Diagnosis present

## 2022-05-18 DIAGNOSIS — M48061 Spinal stenosis, lumbar region without neurogenic claudication: Secondary | ICD-10-CM | POA: Diagnosis present

## 2022-05-18 DIAGNOSIS — D709 Neutropenia, unspecified: Secondary | ICD-10-CM | POA: Diagnosis present

## 2022-05-18 DIAGNOSIS — Z9189 Other specified personal risk factors, not elsewhere classified: Secondary | ICD-10-CM | POA: Diagnosis not present

## 2022-05-18 DIAGNOSIS — Z87891 Personal history of nicotine dependence: Secondary | ICD-10-CM | POA: Diagnosis not present

## 2022-05-18 DIAGNOSIS — T426X5A Adverse effect of other antiepileptic and sedative-hypnotic drugs, initial encounter: Secondary | ICD-10-CM | POA: Diagnosis present

## 2022-05-18 DIAGNOSIS — N3000 Acute cystitis without hematuria: Secondary | ICD-10-CM | POA: Diagnosis present

## 2022-05-18 DIAGNOSIS — M4316 Spondylolisthesis, lumbar region: Secondary | ICD-10-CM | POA: Diagnosis present

## 2022-05-18 LAB — CBC WITH DIFFERENTIAL/PLATELET
Abs Immature Granulocytes: 0.01 10*3/uL (ref 0.00–0.07)
Basophils Absolute: 0 10*3/uL (ref 0.0–0.1)
Basophils Relative: 0 %
Eosinophils Absolute: 0 10*3/uL (ref 0.0–0.5)
Eosinophils Relative: 0 %
HCT: 43.8 % (ref 39.0–52.0)
Hemoglobin: 15.6 g/dL (ref 13.0–17.0)
Immature Granulocytes: 0 %
Lymphocytes Relative: 8 %
Lymphs Abs: 0.2 10*3/uL — ABNORMAL LOW (ref 0.7–4.0)
MCH: 39.4 pg — ABNORMAL HIGH (ref 26.0–34.0)
MCHC: 35.6 g/dL (ref 30.0–36.0)
MCV: 110.6 fL — ABNORMAL HIGH (ref 80.0–100.0)
Monocytes Absolute: 0.4 10*3/uL (ref 0.1–1.0)
Monocytes Relative: 14 %
Neutro Abs: 1.9 10*3/uL (ref 1.7–7.7)
Neutrophils Relative %: 78 %
Platelets: 191 10*3/uL (ref 150–400)
RBC: 3.96 MIL/uL — ABNORMAL LOW (ref 4.22–5.81)
RDW: 13.2 % (ref 11.5–15.5)
WBC: 2.5 10*3/uL — ABNORMAL LOW (ref 4.0–10.5)
nRBC: 0 % (ref 0.0–0.2)

## 2022-05-18 LAB — COMPREHENSIVE METABOLIC PANEL
ALT: 31 U/L (ref 0–44)
AST: 47 U/L — ABNORMAL HIGH (ref 15–41)
Albumin: 4.1 g/dL (ref 3.5–5.0)
Alkaline Phosphatase: 48 U/L (ref 38–126)
Anion gap: 10 (ref 5–15)
BUN: 17 mg/dL (ref 8–23)
CO2: 27 mmol/L (ref 22–32)
Calcium: 9.2 mg/dL (ref 8.9–10.3)
Chloride: 99 mmol/L (ref 98–111)
Creatinine, Ser: 0.92 mg/dL (ref 0.61–1.24)
GFR, Estimated: >60 mL/min (ref >60)
Glucose, Bld: 126 mg/dL — ABNORMAL HIGH (ref 70–99)
Potassium: 3.8 mmol/L (ref 3.5–5.1)
Sodium: 136 mmol/L (ref 135–145)
Total Bilirubin: 1.6 mg/dL — ABNORMAL HIGH (ref 0.3–1.2)
Total Protein: 7.7 g/dL (ref 6.5–8.1)

## 2022-05-18 LAB — URINE CULTURE

## 2022-05-18 LAB — VITAMIN B12: Vitamin B-12: 317 pg/mL (ref 180–914)

## 2022-05-18 LAB — PHOSPHORUS: Phosphorus: 3 mg/dL (ref 2.5–4.6)

## 2022-05-18 LAB — MAGNESIUM: Magnesium: 2.1 mg/dL (ref 1.7–2.4)

## 2022-05-18 LAB — FOLATE: Folate: 35.5 ng/mL (ref >5.9)

## 2022-05-18 MED ORDER — BACLOFEN 10 MG PO TABS
10.0000 mg | ORAL_TABLET | Freq: Two times a day (BID) | ORAL | Status: DC
  Administered 2022-05-18 – 2022-05-20 (×5): 10 mg via ORAL
  Filled 2022-05-18 (×5): qty 1

## 2022-05-18 MED ORDER — GABAPENTIN 300 MG PO CAPS: 300.0000 mg | ORAL_CAPSULE | Freq: Three times a day (TID) | ORAL | Status: DC

## 2022-05-18 MED ORDER — GABAPENTIN 300 MG PO CAPS
900.0000 mg | ORAL_CAPSULE | Freq: Three times a day (TID) | ORAL | Status: DC
  Administered 2022-05-18 – 2022-05-20 (×6): 900 mg via ORAL
  Filled 2022-05-18 (×6): qty 3

## 2022-05-18 MED ORDER — SODIUM CHLORIDE 0.9 % IV SOLN
1.0000 g | INTRAVENOUS | Status: DC
  Administered 2022-05-18 – 2022-05-19 (×2): 1 g via INTRAVENOUS
  Filled 2022-05-18 (×3): qty 10

## 2022-05-18 NOTE — Plan of Care (Signed)
  Problem: Education: Goal: Knowledge of General Education information will improve Description: Including pain rating scale, medication(s)/side effects and non-pharmacologic comfort measures Outcome: Progressing   Problem: Health Behavior/Discharge Planning: Goal: Ability to manage health-related needs will improve Outcome: Progressing   Problem: Clinical Measurements: Goal: Ability to maintain clinical measurements within normal limits will improve Outcome: Progressing   Problem: Coping: Goal: Level of anxiety will decrease Outcome: Progressing   Problem: Elimination: Goal: Will not experience complications related to bowel motility Outcome: Progressing   Problem: Safety: Goal: Ability to remain free from injury will improve Outcome: Progressing   Problem: Skin Integrity: Goal: Risk for impaired skin integrity will decrease Outcome: Progressing

## 2022-05-18 NOTE — ED Notes (Signed)
Physical therapy at bedside

## 2022-05-18 NOTE — Progress Notes (Signed)
[facility] BRIEF MONITORING NOTE Jordan Gunderman. [place] is a 76 yo male presenting with AMS.  Patient is on hydroxyurea prior to admission.  Based on the criteria approved by the P&T committee, hydroxyurea will be automatically held if any of the following are met:  Hydroxyurea (Hydrea) hold criteria: ANC < 2 Pltc < 80K in sickle-cell patients; < 100K in other patients Hgb <= 6 in sickle-cell patients; < 8 in other patients Reticulocytes < 80K when Hgb < 9  7/4: [place] <2  Dimple Nanas, PharmD 05/18/2022 9:20 AM

## 2022-05-18 NOTE — Progress Notes (Signed)
[facility]                                                                               Jordan Vazquez, is a 76 y.o. male, DOB - [birth year], HWT:[id number] Admit date - 05/17/2022    Outpatient Primary MD for the patient is [name], MD  LOS - 0  days    Brief summary   Jordan Vazquez is a 76 y.o. male with medical history significant for essential thrombocytosis, follows with hematology oncology Dr. 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  Ammonia level mildly elevated at 43.  Noncontrast CT head nonacute.  UA and chest x-ray nonacute.  MRI thoracic spine and lumbar spine non acute.  On further discussing with the patient and wife, patient had been having dysuria, dizziness, nausea and vomiting earlier this am.   Assessment & Plan    Assessment and Plan:  Acute metabolic encephalopathy suspect from dehydration and delirium from high fever.  Patient examined this am, he is alert and oriented to place and person and insight.  He reports dizziness on standing up, dysuria and 2 episodes of vomiting this morning.  Recommend to continue with IV fluids, follow up blood and urine cultures.  He was empirically started on IV rocephin for dysuria symptoms.  CXR is negative for pneumonia. CT head is negative for acute pathology. COVID negative. UDS is negative.  If patient continues to have persistent dizziness on standing up , will need to follow up with an MRi of the brain to evaluate for posterior circulation ischemia.  No focal deficits on exam, no meningeal signs . No indication for LP at this time.     Mild Elevated T Bili and mildly elevated AST, mildly elevated ammonia level, leukopenia:  Nausea and vomiting this morning ,  No abdominal tenderness. Unclear etiology.  Recheck  liver panel and ammonia levels in am.    Hyperlipidemia:  Resume statin.    Therapy evaluations     Estimated body mass index is 31.07 kg/m as calculated from the following:   Height as of 02/01/22: '5\' 10"'$  (1.778 m).   Weight as of 02/01/22: 98.2 kg. Code Status: Full code.  DVT Prophylaxis:  enoxaparin (LOVENOX) injection 40 mg Start: 05/17/22 2200   Level of Care: Level of care: Med-Surg Family Communication: Updated patient's wife at bedside.   Disposition Plan:     Remains inpatient appropriate:  IV fluids and IV rocephin.   Procedures:  MRI LS and Thoracic spine.  CT head without contrast.   Consultants:   None.   Antimicrobials:   Anti-infectives (From admission, onward)    Start     Dose/Rate Route Frequency Ordered Stop   05/18/22 1137  cefTRIAXone (ROCEPHIN) 1 g in sodium chloride 0.9 % 100 mL IVPB        1 g 200 mL/hr over 30 Minutes Intravenous Every 24 hours 05/18/22 1137          Medications  Scheduled Meds:  atorvastatin  40 mg  Oral Daily   enoxaparin (LOVENOX) injection  40 mg Subcutaneous Q24H   lidocaine  10 mL Intradermal Once   multivitamin with minerals  1 tablet Oral Daily   Continuous Infusions:  cefTRIAXone (ROCEPHIN)  IV 1 g (05/18/22 1154)   lactated ringers 75 mL/hr at 05/18/22 1138   PRN Meds:.acetaminophen, gadobutrol, HYDROmorphone (DILAUDID) injection, melatonin, ondansetron (ZOFRAN) IV, oxyCODONE, polyethylene glycol    Subjective:   Jordan Vazquez was seen and examined today.    Objective:   Vitals:   05/18/22 0600 05/18/22 0630 05/18/22 0700 05/18/22 1000  BP: 127/85 134/78 (!) 143/86 128/82  Pulse: 81 79 82 70  Resp: (!) '21 19 18 16  '$ Temp:      TempSrc:      SpO2: 93% 98% 96% 91%   No intake or output data in the 24 hours ending 05/18/22 1212 There were no vitals filed for this visit.   Exam General exam: Appears calm and comfortable  Respiratory system: Clear to auscultation. Respiratory effort  normal. Cardiovascular system: S1 & S2 heard, RRR. No JVD, No pedal edema. Gastrointestinal system: Abdomen is nondistended, soft and nontender.  Normal bowel sounds heard. Central nervous system: Alert and oriented. No focal neurological deficits. Extremities: Symmetric 5 x 5 power. Skin: No rashes, lesions or ulcers Psychiatry: Mood & affect appropriate.    Data Reviewed:  I have personally reviewed following labs and imaging studies   CBC Lab Results  Component Value Date   WBC 2.5 (L) 05/18/2022   RBC 3.96 (L) 05/18/2022   HGB 15.6 05/18/2022   HCT 43.8 05/18/2022   MCV 110.6 (H) 05/18/2022   MCH 39.4 (H) 05/18/2022   PLT 191 05/18/2022   MCHC 35.6 05/18/2022   RDW 13.2 05/18/2022   LYMPHSABS 0.2 (L) 05/18/2022   MONOABS 0.4 05/18/2022   EOSABS 0.0 05/18/2022   BASOSABS 0.0 17/51/0258     Last metabolic panel Lab Results  Component Value Date   NA 136 05/18/2022   K 3.8 05/18/2022   CL 99 05/18/2022   CO2 27 05/18/2022   BUN 17 05/18/2022   CREATININE 0.92 05/18/2022   GLUCOSE 126 (H) 05/18/2022   GFRNONAA >60 05/18/2022   GFRAA >60 06/09/2020   CALCIUM 9.2 05/18/2022   PHOS 3.0 05/18/2022   PROT 7.7 05/18/2022   ALBUMIN 4.1 05/18/2022   BILITOT 1.6 (H) 05/18/2022   ALKPHOS 48 05/18/2022   AST 47 (H) 05/18/2022   ALT 31 05/18/2022   ANIONGAP 10 05/18/2022    CBG (last 3)  No results for input(s): "GLUCAP" in the last 72 hours.    Coagulation Profile: Recent Labs  Lab 05/17/22 1243  INR 1.1     Radiology Studies: MR THORACIC SPINE WO CONTRAST  Result Date: 05/17/2022 CLINICAL DATA:  Back pain.  Neurological deficit EXAM: MRI THORACIC SPINE WITHOUT CONTRAST TECHNIQUE: Multiplanar, multisequence MR imaging of the thoracic spine was performed. No intravenous contrast was administered. COMPARISON:  No prior thoracic imaging. FINDINGS: Alignment:  No malalignment. Vertebrae: No fracture or focal bone lesion. Cord:  No cord compression or focal cord  lesion. Paraspinal and other soft tissues: No significant paravertebral finding. Disc levels: Bulging of the discs at T1-2, T2-3, T3-4, T5-6, T6-7, T9-10, T10-11 and T11-12, narrowing the ventral subarachnoid space but not causing cord compression. Mild foraminal stenosis present at T1-2, T2-3 and T3-4. Other foramina appear widely patent. IMPRESSION: No advanced finding. Disc bulges at multiple thoracic levels as outlined above, but no apparent compressive stenosis of  the central canal. Mild bilateral foraminal narrowing at T1-2, T2-3 and T3-4. Electronically Signed   By: Nelson Chimes M.D.   On: 05/17/2022 17:16   MR LUMBAR SPINE WO CONTRAST  Result Date: 05/17/2022 CLINICAL DATA:  Low back pain. Cauda equina syndrome suspected. Question epidural abscess. EXAM: MRI LUMBAR SPINE WITHOUT CONTRAST TECHNIQUE: Multiplanar, multisequence MR imaging of the lumbar spine was performed. No intravenous contrast was administered. COMPARISON:  08/05/2019 FINDINGS: Segmentation:  5 lumbar type vertebral bodies. Alignment: Chronic degenerative anterolisthesis at L4-5 measuring 6-7 mm. 3 mm retrolisthesis L1-2. Vertebrae:  No fracture or focal bone lesion. Conus medullaris and cauda equina: Conus extends to the T12-L1 level. Conus and cauda equina appear normal. Paraspinal and other soft tissues: Negative Disc levels: T12-L1: Mild bulging of the disc.  No compressive stenosis. L1-2: 3 mm retrolisthesis. Mild stenosis of both lateral recesses but no definite neural compression. L2-3: Bulging of the disc more prominent towards the left. Mild stenosis of the left lateral recess could possibly affect the left L3 nerve. Similar appearance to the prior exam. L3-4: Mild bulging of the disc. Facet and ligamentous hypertrophy. No compressive stenosis. L4-5: Chronic facet arthropathy with 6-7 mm of anterolisthesis. Bulging of the disc. Very severe multifactorial spinal stenosis likely to be symptomatic. Similar appearance to the study of  2020. L5-S1: Mild bulging of the disc. Bilateral facet degeneration and hypertrophy. Bilateral subarticular lateral recess stenosis with some potential to affect the S1 nerves. Similar appearance to the prior study. 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 L5-S1 bilateral subarticular lateral recess stenosis that could possibly affect either S1 nerve, similar to the examination of September 2020. Left lateral recess stenosis at L2-3 that could possibly affect the left L3 nerve, similar to the study of September 2020. Lesser, grossly non-compressive degenerative changes at the other levels as above. Electronically Signed   By: Nelson Chimes M.D.   On: 05/17/2022 17:13   CT HEAD WO CONTRAST (5MM)  Result Date: 05/17/2022 CLINICAL DATA:  Mental status change, unknown cause. EXAM: CT HEAD WITHOUT CONTRAST TECHNIQUE: Contiguous axial images were obtained from the base of the skull through the vertex without intravenous contrast. RADIATION DOSE REDUCTION: This exam was performed according to the departmental dose-optimization program which includes automated exposure control, adjustment of the mA and/or kV according to patient size and/or use of iterative reconstruction technique. COMPARISON:  CT head 02/25/2010. FINDINGS: Brain: Mild generalized volume loss, within normal limits for patient age, and associated dilatation of the ventricles. Ventricular size is unchanged compared to 02/25/2010. No evidence of acute infarction, hemorrhage, hydrocephalus, extra-axial collection or mass lesion/mass effect. Vascular: No hyperdense vessel or unexpected calcification. Skull: Normal. Negative for fracture or focal lesion. Sinuses/Orbits: No acute finding. Trace mucosal thickening in the right frontal sinus. The other paranasal sinuses and mastoid air cells are clear. Other: None. IMPRESSION: No acute intracranial abnormality. Electronically Signed   By: Ileana Roup  M.D.   On: 05/17/2022 13:54   DG Shoulder Left  Result Date: 05/17/2022 CLINICAL DATA:  Left shoulder pain, fall, initial encounter. EXAM: LEFT SHOULDER - 2+ VIEW COMPARISON:  None Available. FINDINGS: Slight superior subluxation of the lateral left clavicle with respect to the acromion, with moderate degenerative changes. No fracture. Visualized chest is unremarkable. IMPRESSION: 1. Minimal superior subluxation of the lateral left clavicle with respect to the acromion, of uncertain acuity. There are fairly extensive degenerative changes in the acromioclavicular joint and this may be  chronic. Please correlate clinically. 2. Otherwise, no acute findings. Electronically Signed   By: Lorin Picket M.D.   On: 05/17/2022 13:41   DG Chest Port 1 View  Result Date: 05/17/2022 CLINICAL DATA:  Provided history: Questionable sepsis-evaluate for abnormality. Weakness, altered mental status, shoulder pain. EXAM: PORTABLE CHEST 1 VIEW COMPARISON:  Prior chest radiographs 04/01/2011 and earlier. FINDINGS: Borderline cardiomegaly. Aortic atherosclerosis. No appreciable airspace consolidation or pulmonary edema. No evidence of pleural effusion or pneumothorax. No acute bony abnormality identified. Degenerative changes of the spine. IMPRESSION: No evidence of acute cardiopulmonary abnormality. Borderline cardiomegaly. Aortic Atherosclerosis (ICD10-I70.0). Electronically Signed   By: Kellie Simmering D.O.   On: 05/17/2022 13:39       Hosie Poisson M.D. [facility] 05/18/2022, 12:12 PM  Available via Epic secure chat 7am-7pm After 7 pm, please refer to night coverage provider listed on amion.

## 2022-05-18 NOTE — Evaluation (Signed)
Occupational Therapy Evaluation Patient Details Name: Jordan Vazquez MRN: [id number] DOB: 1946/10/07 Today's Date: 05/18/2022   History of Present Illness patient is a 76 year old male who presented with altered mental status. patient had a fall two days prior to admission with L shoulder pain per chart review with patient reporting fall was months ago. imaging of L shoulder revealed "Minimal superior subluxation of the lateral left clavicle with  respect to the acromion, of uncertain acuity. 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 Patient is noncompliant with sling use in ED at this time with education from therapist. Patient reported it does not fit right but declines for therapist to assist with adjustments. Patient was noted to have drop in BP with supine to sit on edge of bed. Patient was noted to have decreased functional activity tolerance, decreased endurance, decreased standing balance, increased pain,  decreased safety awareness, and decreased knowledge of AD/AE impacting participation in ADLs. Patient would continue to benefit from skilled OT services at this time while admitted and after d/c to address noted deficits in order to improve overall safety and independence in ADLs.    Blood pressures: Supine 139/84 mmhg  Sitting 126/75 mmhg with dizziness reported     Recommendations for follow up therapy are one component of a multi-disciplinary discharge planning process, led by the attending physician.  Recommendations may be updated based on patient status, additional functional criteria and insurance authorization.   Follow Up Recommendations       Assistance  Recommended at Discharge Frequent or constant Supervision/Assistance  Patient can return home with the following A lot of help with walking and/or transfers;A lot of help with bathing/dressing/bathroom;Assistance with cooking/housework;Direct supervision/assist for medications management;Direct supervision/assist for financial management;Help with stairs or ramp for entrance;Assist for transportation    Functional Status Assessment  Patient has had a recent decline in their functional status and demonstrates the ability to make significant improvements in function in a reasonable and predictable amount of time.  Equipment Recommendations       Recommendations for Other Services       Precautions / Restrictions Precautions Precautions: Fall Precaution Comments: L shoulder clavicle subluxation with non compliance with sling Required Braces or Orthoses: Sling Restrictions Other Position/Activity Restrictions: assumed NWB but no formal orders written      Mobility Bed Mobility Overal bed mobility: Needs Assistance Bed Mobility: Supine to Sit, Sit to Supine     Supine to sit: Mod assist     General bed mobility comments: with cues for log rolling to the edge of bed    Transfers                          Balance Overall balance assessment: Mild deficits observed, not formally tested                                         ADL either performed or assessed with clinical judgement   ADL Overall ADL's : Needs assistance/impaired Eating/Feeding: NPO Eating/Feeding Details (indicate cue type and reason): pending lumbar puncture Grooming: Set up;Sitting  Upper Body Bathing: Moderate assistance;Sitting   Lower Body Bathing: Sitting/lateral leans;Minimal assistance   Upper Body Dressing : Moderate assistance;Sitting   Lower Body Dressing: Minimal assistance;Sit to/from stand   Toilet Transfer: +2 for safety/equipment;+2 for physical  assistance Toilet Transfer Details (indicate cue type and reason): patient attempted to stand with increased dizziness with return to bed with patient unable to participate in standing long enough to participate in BP in standing. Toileting- Clothing Manipulation and Hygiene: Maximal assistance;Sit to/from stand               Vision Patient Visual Report: No change from baseline       Perception     Praxis      Pertinent Vitals/Pain Pain Assessment Pain Assessment: Faces Faces Pain Scale: Hurts even more Pain Location: L shoulder and back Pain Intervention(s): Limited activity within patient's tolerance, Monitored during session     Hand Dominance     Extremity/Trunk Assessment Upper Extremity Assessment Upper Extremity Assessment: LUE deficits/detail LUE Deficits / Details: ROM not tested with education provided on subluxation and recommendations for sling use and NWB at this time. posterior glenohumeral joint noted to be red and slightly increased temperature. patient continued to attempt to use LUE during session even with pain and education to keep in sling position.   Lower Extremity Assessment Lower Extremity Assessment: Defer to PT evaluation   Cervical / Trunk Assessment Cervical / Trunk Assessment: Normal   Communication Communication Communication: No difficulties   Cognition Arousal/Alertness: Awake/alert Behavior During Therapy: WFL for tasks assessed/performed Overall Cognitive Status: Difficult to assess                                 General Comments: patient was noted to be fixated on lumbar pucture at this time. patient was educated that this was MD decision and then patient was noted to ask this question multiple times during session. patient was non compliant with sling even with education. son present in room.     General Comments  Patients BP was 139/84 mmhg lying in bed in ED. patient reported dizziness sitting on EOB. patients  blood pressure was 126/75 mmhg. patient was unable to get blood pressure in standing wtih patient sitting down mid attempt to get BP reading reporting fatigue. patient returned to bed at this time.    Exercises     Shoulder Instructions      Home Living Family/patient expects to be discharged to:: Private residence Living Arrangements: Spouse/significant other Available Help at Discharge: Family Type of Home: House Home Access: Stairs to enter Technical brewer of Steps: 1 Entrance Stairs-Rails: Right Home Layout: One level               Home Equipment: Conservation officer, nature (2 wheels);Cane - single point          Prior Functioning/Environment Prior Level of Function : Independent/Modified Independent             Mobility Comments: drives,          OT Problem List: Decreased strength;Decreased activity tolerance;Impaired balance (sitting and/or standing);Decreased cognition;Decreased safety awareness;Decreased knowledge of use of DME or AE;Decreased knowledge of precautions;Impaired UE functional use;Pain      OT Treatment/Interventions: Self-care/ADL training;Therapeutic exercise;Energy conservation;DME and/or AE instruction;Therapeutic activities;Balance training;Cognitive remediation/compensation;Patient/family education    OT Goals(Current goals can be found in the care plan section)    OT Frequency: Min 2X/week    Co-evaluation  PT/OT/SLP Co-Evaluation/Treatment: Yes Reason for Co-Treatment: To address functional/ADL transfers PT goals addressed during session: Mobility/safety with mobility OT goals addressed during session: ADL's and self-care      AM-PAC OT "6 Clicks" Daily Activity     Outcome Measure Help from another person eating meals?: Total (NPO) Help from another person taking care of personal grooming?: A Little Help from another person toileting, which includes using toliet, bedpan, or urinal?: A Lot Help from another person bathing  (including washing, rinsing, drying)?: A Lot Help from another person to put on and taking off regular upper body clothing?: A Lot Help from another person to put on and taking off regular lower body clothing?: A Lot 6 Click Score: 12   End of Session Equipment Utilized During Treatment: Gait belt  Activity Tolerance: Patient tolerated treatment well Patient left: in bed;with call bell/phone within reach;with family/visitor present  OT Visit Diagnosis: Unsteadiness on feet (R26.81);Other abnormalities of gait and mobility (R26.89)                Time:  -    Charges:     Jackelyn Poling OTR/L, MS Acute Rehabilitation Department Office# [phone] Pager# [phone]   Marcellina Millin 05/18/2022, 9:52 AM

## 2022-05-18 NOTE — Evaluation (Signed)
Physical Therapy Evaluation Patient Details Name: Jordan Vazquez MRN: [id number] DOB: Oct 18, 1946 Today's Date: 05/18/2022  History of Present Illness  patient is a 76 year old male who presented with altered mental status. patient had a fall two days prior to admission with L shoulder pain per chart review with patient reporting fall was months ago. imaging of L shoulder revealed "Minimal superior subluxation of the lateral left clavicle with  respect to the acromion, of uncertain acuity. 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 Patient adamant that he  did not recently fall, that he fell several months ago, Patient not clear on when and how he fell and how long his LEft UE subluxation has been ongoing.  Patient reports dizziness when sitting and standing.   Orthostatic BPs  Supine 138/85  Sitting 126/75  Sitting after 1 min 147/91  Pt with  balance loss and sat down       Patient resides with wife and was independent  and driving PTA. Patient now has limitations of the LUE with subluxation , declined to have sling placed. Patient may now require some ADL assistance.  Pt admitted with above diagnosis.  Pt currently with functional limitations due to the deficits listed below (see PT Problem List). Pt will benefit from skilled PT to increase their independence and safety with mobility to allow discharge to the venue listed below.          Recommendations for follow up therapy are one component of a multi-disciplinary discharge planning process, led by the attending physician.  Recommendations may be updated based on patient status, additional functional criteria and insurance  authorization.  Follow Up Recommendations Home health PT      Assistance Recommended at Discharge Frequent or constant Supervision/Assistance  Patient can return home with the following  A little help with walking and/or transfers;A little help with bathing/dressing/bathroom;Help with stairs or ramp for entrance;Assistance with cooking/housework;Assist for transportation    Equipment Recommendations None recommended by PT  Recommendations for Other Services       Functional Status Assessment Patient has had a recent decline in their functional status and demonstrates the ability to make significant improvements in function in a reasonable and predictable amount of time.     Precautions / Restrictions Precautions Precautions: Fall Precaution Comments: L shoulder clavicle subluxation with non compliance with sling Required Braces or Orthoses: Sling Restrictions Other Position/Activity Restrictions: assumed NWB but no formal orders written, patient not wearing sling and declined to have it placed      Mobility  Bed Mobility               General bed mobility comments: with cues for log rolling to the edge of bed    Transfers Overall transfer level: Needs assistance   Transfers: Sit to/from Stand             General transfer comment: supported on the bedside table with RUE. Stood  x ~ 2 minutes, then began to sway and sat down    Ambulation/Gait                  Financial trader  Rankin (Stroke Patients Only)       Balance Overall balance assessment: Needs assistance Sitting-balance support: Single extremity supported, Feet supported Sitting balance-Leahy Scale: Fair     Standing balance support: During functional activity, Single extremity supported, Reliant on assistive device for balance Standing balance-Leahy Scale: Poor Standing balance comment: dizziness limited test                              Pertinent Vitals/Pain Pain Assessment Faces Pain Scale: Hurts even more Pain Location: L shoulder and back Pain Descriptors / Indicators: Discomfort Pain Intervention(s): Limited activity within patient's tolerance, Monitored during session, Repositioned    Home Living Family/patient expects to be discharged to:: Private residence Living Arrangements: Spouse/significant other Available Help at Discharge: Family Type of Home: House Home Access: Stairs to enter Entrance Stairs-Rails: Right Entrance Stairs-Number of Steps: 1   Home Layout: One level Home Equipment: Conservation officer, nature (2 wheels);Cane - single point      Prior Function Prior Level of Function : Independent/Modified Independent             Mobility Comments: drives,       Hand Dominance        Extremity/Trunk Assessment   Upper Extremity Assessment Upper Extremity Assessment: Defer to OT evaluation LUE Deficits / Details: ROM not tested with education provided on subluxation and recommendations for sling use and NWB at this time. posterior glenohumeral joint noted to be red and slightly increased temperature. patient continued to attempt to use LUE during session even with pain and education to keep in sling position.    Lower Extremity Assessment Lower Extremity Assessment: Generalized weakness    Cervical / Trunk Assessment Cervical / Trunk Assessment: Normal  Communication   Communication: No difficulties  Cognition Arousal/Alertness: Awake/alert Behavior During Therapy: WFL for tasks assessed/performed Overall Cognitive Status: Difficult to assess                                 General Comments: patient was noted to be fixated on lumbar pucture at this time. patient was educated that this was MD decision and then patient was noted to ask this question multiple times during session. patient was non compliant with sling even with education. son present in room.         General Comments General comments (skin integrity, edema, etc.): Patients BP was 139/84 mmhg lying in bed in ED. patient reported dizziness sitting on EOB. patients blood pressure was 126/75 mmhg. patient was unable to get blood pressure in standing wtih patient sitting down mid attempt to get BP reading reporting fatigue. patient returned to bed at this time.    Exercises     Assessment/Plan    PT Assessment Patient needs continued PT services  PT Problem List Decreased strength;Decreased mobility;Decreased safety awareness;Decreased knowledge of precautions;Decreased activity tolerance;Cardiopulmonary status limiting activity;Decreased balance;Pain       PT Treatment Interventions DME instruction;Therapeutic activities;Gait training;Therapeutic exercise;Patient/family education;Functional mobility training    PT Goals (Current goals can be found in the Care Plan section)  Acute Rehab PT Goals Patient Stated Goal: go home PT Goal Formulation: With patient/family Time For Goal Achievement: 06/01/22 Potential to Achieve Goals: Good    Frequency Min 3X/week     Co-evaluation PT/OT/SLP Co-Evaluation/Treatment: Yes Reason for Co-Treatment: For patient/therapist safety;To address functional/ADL transfers PT goals addressed during session: Mobility/safety with mobility OT  goals addressed during session: ADL's and self-care       AM-PAC PT "6 Clicks" Mobility  Outcome Measure Help needed turning from your back to your side while in a flat bed without using bedrails?: A Little Help needed moving from lying on your back to sitting on the side of a flat bed without using bedrails?: A Little Help needed moving to and from a bed to a chair (including a wheelchair)?: A Little Help needed standing up from a chair using your arms (e.g., wheelchair or bedside chair)?: A Little Help needed to walk in hospital room?: Total Help needed climbing 3-5 steps with a railing? : Total 6 Click Score:  14    End of Session Equipment Utilized During Treatment: Gait belt Activity Tolerance: Patient limited by fatigue;Treatment limited secondary to medical complications (Comment) Patient left: in bed;with call bell/phone within reach;with family/visitor present Nurse Communication: Mobility status PT Visit Diagnosis: Unsteadiness on feet (R26.81);Pain;Difficulty in walking, not elsewhere classified (R26.2) Pain - Right/Left: Left Pain - part of body: Shoulder    Time: 1062-6948 PT Time Calculation (min) (ACUTE ONLY): 22 min   Charges:   PT Evaluation $PT Eval Low Complexity: [place] Office [phone] Weekend [phone]   Claretha Cooper 05/18/2022, 10:05 AM

## 2022-05-19 DIAGNOSIS — N3001 Acute cystitis with hematuria: Secondary | ICD-10-CM | POA: Diagnosis not present

## 2022-05-19 DIAGNOSIS — R29818 Other symptoms and signs involving the nervous system: Secondary | ICD-10-CM

## 2022-05-19 DIAGNOSIS — G9341 Metabolic encephalopathy: Secondary | ICD-10-CM | POA: Diagnosis not present

## 2022-05-19 DIAGNOSIS — G629 Polyneuropathy, unspecified: Secondary | ICD-10-CM

## 2022-05-19 DIAGNOSIS — R300 Dysuria: Secondary | ICD-10-CM

## 2022-05-19 DIAGNOSIS — I10 Essential (primary) hypertension: Secondary | ICD-10-CM | POA: Insufficient documentation

## 2022-05-19 DIAGNOSIS — E860 Dehydration: Secondary | ICD-10-CM

## 2022-05-19 DIAGNOSIS — M170 Bilateral primary osteoarthritis of knee: Secondary | ICD-10-CM

## 2022-05-19 DIAGNOSIS — R112 Nausea with vomiting, unspecified: Secondary | ICD-10-CM

## 2022-05-19 DIAGNOSIS — M179 Osteoarthritis of knee, unspecified: Secondary | ICD-10-CM

## 2022-05-19 DIAGNOSIS — R4182 Altered mental status, unspecified: Secondary | ICD-10-CM | POA: Diagnosis not present

## 2022-05-19 DIAGNOSIS — Z9189 Other specified personal risk factors, not elsewhere classified: Secondary | ICD-10-CM

## 2022-05-19 DIAGNOSIS — N3000 Acute cystitis without hematuria: Secondary | ICD-10-CM

## 2022-05-19 LAB — CBC WITH DIFFERENTIAL/PLATELET
Abs Immature Granulocytes: 0.01 10*3/uL (ref 0.00–0.07)
Basophils Absolute: 0 10*3/uL (ref 0.0–0.1)
Basophils Relative: 0 %
Eosinophils Absolute: 0 10*3/uL (ref 0.0–0.5)
Eosinophils Relative: 0 %
HCT: 40.6 % (ref 39.0–52.0)
Hemoglobin: 14.5 g/dL (ref 13.0–17.0)
Immature Granulocytes: 0 %
Lymphocytes Relative: 16 %
Lymphs Abs: 0.5 10*3/uL — ABNORMAL LOW (ref 0.7–4.0)
MCH: 39.2 pg — ABNORMAL HIGH (ref 26.0–34.0)
MCHC: 35.7 g/dL (ref 30.0–36.0)
MCV: 109.7 fL — ABNORMAL HIGH (ref 80.0–100.0)
Monocytes Absolute: 0.6 10*3/uL (ref 0.1–1.0)
Monocytes Relative: 20 %
Neutro Abs: 1.8 10*3/uL (ref 1.7–7.7)
Neutrophils Relative %: 64 %
Platelets: 170 10*3/uL (ref 150–400)
RBC: 3.7 MIL/uL — ABNORMAL LOW (ref 4.22–5.81)
RDW: 13.2 % (ref 11.5–15.5)
WBC: 2.9 10*3/uL — ABNORMAL LOW (ref 4.0–10.5)
nRBC: 0 % (ref 0.0–0.2)

## 2022-05-19 LAB — BASIC METABOLIC PANEL
Anion gap: 10 (ref 5–15)
BUN: 20 mg/dL (ref 8–23)
CO2: 25 mmol/L (ref 22–32)
Calcium: 8.8 mg/dL — ABNORMAL LOW (ref 8.9–10.3)
Chloride: 103 mmol/L (ref 98–111)
Creatinine, Ser: 0.79 mg/dL (ref 0.61–1.24)
GFR, Estimated: >60 mL/min (ref >60)
Glucose, Bld: 107 mg/dL — ABNORMAL HIGH (ref 70–99)
Potassium: 3.6 mmol/L (ref 3.5–5.1)
Sodium: 138 mmol/L (ref 135–145)

## 2022-05-19 NOTE — Plan of Care (Signed)
  Problem: Health Behavior/Discharge Planning: Goal: Ability to manage health-related needs will improve Outcome: Progressing   

## 2022-05-19 NOTE — Progress Notes (Signed)
Physical Therapy Treatment Patient Details Name: Jordan Vazquez MRN: [id number] DOB: [birth year] Today's Date: 05/19/2022   History of Present Illness patient is a 76 year old male who presented with altered mental status. patient had a fall two days prior to admission with L shoulder pain per chart review with patient reporting fall was months ago. imaging of L shoulder revealed "Minimal superior subluxation of the lateral left clavicle with  respect to the acromion, of uncertain acuity. 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  Pt's mobility improving, and pt denied dizziness today.  Pt has SPC and RW available to use at home.   Recommendations for follow up therapy are one component of a multi-disciplinary discharge planning process, led by the attending physician.  Recommendations may be updated based on patient status, additional functional criteria and insurance authorization.  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 per xray); pt adamant that it is not acute, and he states he threw away his sling     Mobility  Bed Mobility                General bed mobility comments: sitting EOB on arrival (spouse (?) in room)    Transfers Overall transfer level: Needs assistance Equipment used: None Transfers: Sit to/from Stand Sit to Stand: Min guard           General transfer comment: min/guard for safety, denies dizziness    Ambulation/Gait Ambulation/Gait assistance: Min guard Gait Distance (Feet): 400 Feet Assistive device: Straight cane Gait Pattern/deviations: Step-through pattern, Decreased stride length       General Gait Details: mildly unsteady initially however improved with distance, pt using SPC appropriately and has one at home; denies symptoms   Stairs             Wheelchair Mobility    Modified Rankin (Stroke Patients Only)       Balance Overall balance assessment: Needs assistance         Standing balance support: No upper extremity supported Standing balance-Leahy Scale: Fair Standing balance comment: static fair                            Cognition Arousal/Alertness: Awake/alert Behavior During Therapy: WFL for tasks assessed/performed                                   General Comments: pt did not want to talk about his shoulder (states other therapists "harped" on this and it's not a problem); pt was agreeable to use [facility] and has one at home        Exercises      General Comments  Pertinent Vitals/Pain Pain Assessment Pain Assessment: Faces Pain Location: L shoulder with movement Pain Descriptors / Indicators: Discomfort Pain Intervention(s): Repositioned, Monitored during session    Home Living                          Prior Function            PT Goals (current goals can now be found in the care plan section) Progress towards PT goals: Progressing toward goals    Frequency    Min 3X/week      PT Plan Current plan remains appropriate    Co-evaluation              AM-PAC PT "6 Clicks"  Mobility   Outcome Measure  Help needed turning from your back to your side while in a flat bed without using bedrails?: A Little Help needed moving from lying on your back to sitting on the side of a flat bed without using bedrails?: A Little Help needed moving to and from a bed to a chair (including a wheelchair)?: A Little Help needed standing up from a chair using your arms (e.g., wheelchair or bedside chair)?: A Little Help needed to walk in hospital room?: A Little Help needed climbing 3-5 steps with a railing? : A Little 6 Click Score: 18    End of Session Equipment Utilized During Treatment: Gait belt Activity Tolerance: Patient tolerated treatment well Patient left: in bed;with call bell/phone within reach;with family/visitor present   PT Visit Diagnosis: Unsteadiness on feet (R26.81);Difficulty in walking, not elsewhere classified (R26.2)     Time: 7253-6644 PT Time Calculation (min) (ACUTE ONLY): 12 min  Charges:  $Gait Training: 8-22 mins                    Jannette Spanner PT, DPT Physical Therapist Acute Rehabilitation Services Preferred contact method: Secure Chat Weekend Pager Only: [phone] Office: [place] 05/19/2022, 4:04 PM

## 2022-05-19 NOTE — Progress Notes (Signed)
24 hour chart audit completed 

## 2022-05-19 NOTE — Plan of Care (Signed)
  Problem: Education: Goal: Knowledge of General Education information will improve Description: Including pain rating scale, medication(s)/side effects and non-pharmacologic comfort measures Outcome: Progressing   Problem: Health Behavior/Discharge Planning: Goal: Ability to manage health-related needs will improve Outcome: Progressing   Problem: Activity: Goal: Risk for activity intolerance will decrease Outcome: Progressing   Problem: Coping: Goal: Level of anxiety will decrease Outcome: Progressing   Problem: Elimination: Goal: Will not experience complications related to bowel motility Outcome: Progressing   Problem: Pain Managment: Goal: General experience of comfort will improve Outcome: Progressing   Problem: Safety: Goal: Ability to remain free from injury will improve Outcome: Progressing   Problem: Skin Integrity: Goal: Risk for impaired skin integrity will decrease Outcome: Progressing

## 2022-05-19 NOTE — TOC Transition Note (Signed)
Transition of Care [facility]) - CM/SW Discharge Note   Patient Details  Name: Jordan Vazquez MRN: [id number] Date of Birth: Jun 24, 1946  [facility] CM/SW Contact:  Lennart Pall, LCSW Phone Number: 05/19/2022, 11:42 AM   Clinical Narrative:     Met with pt and spouse today to review any possible dc needs.  PT finishing up with pt and he is doing MUCH better and HHPT no longer recommended and pt in agreement with no need for follow up.  Pt denies any concerns regarding SA and declines any resource needs.  Both feeling VERY ready for dc home today.  No TOC needs.  Final next level of care: Home w Home Health Services Barriers to Discharge: No Barriers Identified   Patient Goals and CMS Choice Patient states their goals for this hospitalization and ongoing recovery are:: return home      Discharge Placement                       Discharge Plan and Services                DME Arranged: N/A DME Agency: NA                  Social Determinants of Health (SDOH) Interventions     Readmission Risk Interventions    05/19/2022   11:41 AM  Readmission Risk Prevention Plan  Post Dischage Appt Complete  Medication Screening Complete  Transportation Screening Complete

## 2022-05-19 NOTE — Progress Notes (Signed)
PROGRESS NOTE  Jordan Vazquez SNK:[id number] DOB: 13-Mar-1946   PCP: [name], MD  Patient is from: Home.  Lives with his wife.  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 Irene Limbo, chronic lower back pain, neuropathy, HTN, HLD and osteoarthritis involving multiple joints presenting with altered mental status, nausea and vomiting, and admitted with acute metabolic encephalopathy in the setting of UTI and dehydration.  CT head without acute finding.  Ammonia 43.  MRI thoracic and lumbar spine without acute finding.  Patient was started on IVF and IV ceftriaxone with improvement in his mental status.    Subjective: Seen and examined earlier this morning.  No major events overnight of this morning other than a little congestion in his chest.  He denies shortness of breath, nausea, vomiting or abdominal pain.  Likes to try solid food.  He is oriented x4.  Patient's wife at bedside.  Objective: Vitals:   05/19/22 0805 05/19/22 0833 05/19/22 0958 05/19/22 1330  BP: 108/78 108/78 115/74 (!) 121/96  Pulse: 62 62 (!) 58 67  Resp: '16 16 18 18  '$ Temp: 97.8 F (36.6 C) 97.8 F (36.6 C) 98 F (36.7 C) (!) 97.4 F (36.3 C)  TempSrc: Oral Oral Oral Oral  SpO2: 97%  98% 98%  Weight:  91.8 kg    Height:  6' (1.829 m)      Examination:  GENERAL: No apparent distress.  Nontoxic. HEENT: MMM.  Vision and hearing grossly intact.  NECK: Supple.  No apparent JVD.  RESP:  No IWOB.  Fair aeration bilaterally. CVS:  RRR. Heart sounds normal.  ABD/GI/GU: BS+. Abd soft, NTND.  MSK/EXT:  Moves extremities. No apparent deformity. No edema.  SKIN: no apparent skin lesion or wound NEURO: Awake, alert and oriented appropriately.  No apparent focal neuro deficit. PSYCH: Calm. Normal affect.   Procedures:   None  Microbiology summarized: Blood cultures NGTD. Urine culture with multiple species. COVID-19 and influenza PCR nonreactive.  Assessment and plan: Principal Problem:   AMS (altered mental status) Active Problems:   Acute metabolic encephalopathy   Dehydration   Acute cystitis   At risk for polypharmacy   Osteoarthritis of knee   Neurogenic claudication   Neuropathy   Nausea and vomiting   Acute metabolic encephalopathy: Likely due to UTI, dehydration and possibly polypharmacy.  Resolved. -Treat treatable causes -Reorientation and delirium precautions. -Minimize sedating medication  Acute cystitis: UA concerning.  Urine culture with multiple species.  Patient presented with dysuria.  Has neutropenia. -Continue IV ceftriaxone empirically.  Elevated AST/hyperbilirubinemia: Improved. -Check CK   Nausea and vomiting: Resolved. -Advance to soft diet -As needed antiemetics  Physical deconditioning -PT/OT  Osteoarthritis involving multiple joints -Pain control -PT/OT  HLD -Check CK  Neutropenia/macrocytosis: Improved.  Folic acid and P37 within normal.  -Continue monitoring   At risk for polypharmacy: On a very significant dose of gabapentin and baclofen -Discussed risk and benefits. Body mass index is 27.45 kg/m.           DVT prophylaxis:  enoxaparin (LOVENOX) injection 40 mg Start: 05/17/22 2200  Code Status: Full code Family Communication: Updated patient's wife at bedside. Level of care: Med-Surg Status is: Inpatient Remains inpatient appropriate because: Acute cystitis requiring IV antibiotics   Final disposition: Likely home in the next 24 to  48 hours. Consultants:  None  Sch Meds:  Scheduled Meds:  atorvastatin  40 mg Oral Daily   baclofen  10 mg Oral BID   enoxaparin (LOVENOX) injection  40 mg Subcutaneous Q24H   gabapentin  900 mg Oral TID   lidocaine  10 mL Intradermal Once   multivitamin with minerals  1 tablet Oral Daily    Continuous Infusions:  cefTRIAXone (ROCEPHIN)  IV Stopped (05/19/22 1229)   PRN Meds:.acetaminophen, gadobutrol, HYDROmorphone (DILAUDID) injection, melatonin, ondansetron (ZOFRAN) IV, oxyCODONE, polyethylene glycol  Antimicrobials: Anti-infectives (From admission, onward)    Start     Dose/Rate Route Frequency Ordered Stop   05/18/22 1137  cefTRIAXone (ROCEPHIN) 1 g in sodium chloride 0.9 % 100 mL IVPB        1 g 200 mL/hr over 30 Minutes Intravenous Every 24 hours 05/18/22 1137          I have personally reviewed the following labs and images: CBC: Recent Labs  Lab 05/18/22 0612 05/19/22 0334  WBC 2.5* 2.9*  NEUTROABS 1.9 1.8  HGB 15.6 14.5  HCT 43.8 40.6  MCV 110.6* 109.7*  PLT 191 170   BMP &GFR Recent Labs  Lab 05/17/22 1243 05/18/22 0612 05/19/22 0334  NA 136 136 138  K 4.0 3.8 3.6  CL 104 99 103  CO2 '22 27 25  '$ GLUCOSE 111* 126* 107*  BUN '17 17 20  '$ CREATININE 0.76 0.92 0.79  CALCIUM 8.9 9.2 8.8*  MG  --  2.1  --   PHOS  --  3.0  --    Estimated Creatinine Clearance: 86.2 mL/min (by C-G formula based on SCr of 0.79 mg/dL). Liver & Pancreas: Recent Labs  Lab 05/17/22 1243 05/18/22 0612  AST 45* 47*  ALT 28 31  ALKPHOS 48 48  BILITOT 1.2 1.6*  PROT 7.6 7.7  ALBUMIN 4.5 4.1   No results for input(s): "LIPASE", "AMYLASE" in the last 168 hours. Recent Labs  Lab 05/17/22 1243  AMMONIA 43*   Diabetic: No results for input(s): "HGBA1C" in the last 72 hours. No results for input(s): "GLUCAP" in the last 168 hours. Cardiac Enzymes: No results for input(s): "CKTOTAL", "CKMB", "CKMBINDEX", "TROPONINI" in the last 168 hours. No results for input(s): "PROBNP" in the last 8760 hours. Coagulation Profile: Recent Labs  Lab 05/17/22 1243  INR 1.1   Thyroid Function Tests: No results for input(s): "TSH", "T4TOTAL", "FREET4", "T3FREE", "THYROIDAB" in the last 72 hours. Lipid Profile: No results for input(s): "CHOL", "HDL", "LDLCALC", "TRIG",  "CHOLHDL", "LDLDIRECT" in the last 72 hours. Anemia Panel: Recent Labs    05/18/22 1406  VITAMINB12 317  FOLATE 35.5   Urine analysis:    Component Value Date/Time   COLORURINE YELLOW 05/17/2022 [place] 05/17/2022 1243   LABSPEC 1.017 05/17/2022 1243   PHURINE 6.0 05/17/2022 1243   GLUCOSEU NEGATIVE 05/17/2022 1243   HGBUR MODERATE (A) 05/17/2022 1243   BILIRUBINUR NEGATIVE 05/17/2022 1243   KETONESUR 5 (A) 05/17/2022 1243   PROTEINUR 30 (A) 05/17/2022 1243   UROBILINOGEN 0.2 09/17/2011 1014   NITRITE NEGATIVE 05/17/2022 1243   LEUKOCYTESUR NEGATIVE 05/17/2022 1243   Sepsis Labs: Invalid input(s): "PROCALCITONIN", "LACTICIDVEN"  Microbiology: Recent Results (from the past 240 hour(s))  Blood Culture (routine x 2)     Status: None (Preliminary result)   Collection Time: 05/17/22 12:43 PM   Specimen: BLOOD  Result Value Ref Range Status   Specimen Description   Final    BLOOD LEFT ANTECUBITAL Performed  at [facility], [place] [address], [place], [place] [address]    Special Requests   Final    BOTTLES DRAWN AEROBIC AND ANAEROBIC Blood Culture results may not be optimal due to an inadequate volume of blood received in culture bottles Performed at [facility] [address], [place], [place] [address]    Culture   Final    NO GROWTH 2 DAYS Performed at [facility] [address], [place], [place] [address]    Report Status PENDING  Incomplete  Urine Culture     Status: Abnormal   Collection Time: 05/17/22 12:43 PM   Specimen: In/Out Cath Urine  Result Value Ref Range Status   Specimen Description   Final    IN/OUT CATH URINE Performed at [facility] [address], [place], [place] [address]    Special Requests   Final    NONE Performed at [facility], [place] [address], [place], [place] [address]    Culture MULTIPLE SPECIES PRESENT, SUGGEST RECOLLECTION (A)   Final   Report Status 05/18/2022 FINAL  Final  Blood Culture (routine x 2)     Status: None (Preliminary result)   Collection Time: 05/17/22  1:28 PM   Specimen: BLOOD  Result Value Ref Range Status   Specimen Description   Final    BLOOD BLOOD LEFT HAND Performed at [facility] [address], [place], [place] [address]    Special Requests   Final    BOTTLES DRAWN AEROBIC AND ANAEROBIC Blood Culture results may not be optimal due to an inadequate volume of blood received in culture bottles Performed at [facility] [address], [place], [place] [address]    Culture   Final    NO GROWTH 2 DAYS Performed at [facility] [address], [place], [place] [address]    Report Status PENDING  Incomplete  Resp Panel by RT-PCR (Flu A&B, Covid) Anterior Nasal Swab     Status: None   Collection Time: 05/17/22  5:23 PM   Specimen: Anterior Nasal Swab  Result Value Ref Range Status   SARS Coronavirus 2 by RT PCR NEGATIVE NEGATIVE Final    Comment: (NOTE) SARS-CoV-2 target nucleic acids are NOT DETECTED.  The SARS-CoV-2 RNA is generally detectable in upper respiratory specimens during the acute phase of infection. The lowest concentration of SARS-CoV-2 viral copies this assay can detect is 138 copies/mL. A negative result does not preclude SARS-Cov-2 infection and should not be used as the sole basis for treatment or other patient management decisions. A negative result may occur with  improper specimen collection/handling, submission of specimen other than nasopharyngeal swab, presence of viral mutation(s) within the areas targeted by this assay, and inadequate number of viral copies(<138 copies/mL). A negative result must be combined with clinical observations, patient history, and epidemiological information. The expected result is Negative.  Fact Sheet for Patients:  EntrepreneurPulse.com.au  Fact Sheet for  Healthcare Providers:  IncredibleEmployment.be  This test is no t yet approved or cleared by the [place] FDA and  has been authorized for detection and/or diagnosis of SARS-CoV-2 by FDA under an Emergency Use Authorization (EUA). This EUA will remain  in effect (meaning this test can be used) for the duration of the COVID-19 declaration under Section 564(b)(1) of the Act, 21 U.S.C.section 360bbb-3(b)(1), unless the authorization is terminated  or revoked sooner.       Influenza A  by PCR NEGATIVE NEGATIVE Final   Influenza B by PCR NEGATIVE NEGATIVE Final    Comment: (NOTE) The Xpert Xpress SARS-CoV-2/FLU/RSV plus assay is intended as an aid in the diagnosis of influenza from Nasopharyngeal swab specimens and should not be used as a sole basis for treatment. Nasal washings and aspirates are unacceptable for Xpert Xpress SARS-CoV-2/FLU/RSV testing.  Fact Sheet for Patients: EntrepreneurPulse.com.au  Fact Sheet for Healthcare Providers: IncredibleEmployment.be  This test is not yet approved or cleared by the [place] FDA and has been authorized for detection and/or diagnosis of SARS-CoV-2 by FDA under an Emergency Use Authorization (EUA). This EUA will remain in effect (meaning this test can be used) for the duration of the COVID-19 declaration under Section 564(b)(1) of the Act, 21 U.S.C. section 360bbb-3(b)(1), unless the authorization is terminated or revoked.  Performed at [facility], [place] [address], [place], [place] [address]     Radiology Studies: No results found.    Khalil Szczepanik T. [place]  If 7PM-7AM, please contact night-coverage [email or web address] 05/19/2022, 5:23 PM

## 2022-05-20 ENCOUNTER — Other Ambulatory Visit: Payer: Self-pay

## 2022-05-20 ENCOUNTER — Other Ambulatory Visit: Payer: Self-pay | Admitting: Hematology

## 2022-05-20 DIAGNOSIS — D473 Essential (hemorrhagic) thrombocythemia: Secondary | ICD-10-CM

## 2022-05-20 DIAGNOSIS — R4182 Altered mental status, unspecified: Secondary | ICD-10-CM | POA: Diagnosis not present

## 2022-05-20 DIAGNOSIS — D72810 Lymphocytopenia: Secondary | ICD-10-CM

## 2022-05-20 DIAGNOSIS — G9341 Metabolic encephalopathy: Secondary | ICD-10-CM | POA: Diagnosis not present

## 2022-05-20 DIAGNOSIS — Z9189 Other specified personal risk factors, not elsewhere classified: Secondary | ICD-10-CM | POA: Diagnosis not present

## 2022-05-20 DIAGNOSIS — N3001 Acute cystitis with hematuria: Secondary | ICD-10-CM | POA: Diagnosis not present

## 2022-05-20 LAB — CK: Total CK: 53 U/L (ref 49–397)

## 2022-05-20 LAB — CBC
HCT: 41.7 % (ref 39.0–52.0)
Hemoglobin: 14.4 g/dL (ref 13.0–17.0)
MCH: 38.1 pg — ABNORMAL HIGH (ref 26.0–34.0)
MCHC: 34.5 g/dL (ref 30.0–36.0)
MCV: 110.3 fL — ABNORMAL HIGH (ref 80.0–100.0)
Platelets: 183 10*3/uL (ref 150–400)
RBC: 3.78 MIL/uL — ABNORMAL LOW (ref 4.22–5.81)
RDW: 13.1 % (ref 11.5–15.5)
WBC: 3.8 10*3/uL — ABNORMAL LOW (ref 4.0–10.5)
nRBC: 0 % (ref 0.0–0.2)

## 2022-05-20 LAB — RENAL FUNCTION PANEL
Albumin: 3.6 g/dL (ref 3.5–5.0)
Anion gap: 9 (ref 5–15)
BUN: 20 mg/dL (ref 8–23)
CO2: 27 mmol/L (ref 22–32)
Calcium: 8.6 mg/dL — ABNORMAL LOW (ref 8.9–10.3)
Chloride: 103 mmol/L (ref 98–111)
Creatinine, Ser: 0.7 mg/dL (ref 0.61–1.24)
GFR, Estimated: >60 mL/min (ref >60)
Glucose, Bld: 100 mg/dL — ABNORMAL HIGH (ref 70–99)
Phosphorus: 3.3 mg/dL (ref 2.5–4.6)
Potassium: 3.7 mmol/L (ref 3.5–5.1)
Sodium: 139 mmol/L (ref 135–145)

## 2022-05-20 LAB — HEPATIC FUNCTION PANEL
ALT: 39 U/L (ref 0–44)
AST: 51 U/L — ABNORMAL HIGH (ref 15–41)
Albumin: 3.5 g/dL (ref 3.5–5.0)
Alkaline Phosphatase: 44 U/L (ref 38–126)
Bilirubin, Direct: 0.2 mg/dL (ref 0.0–0.2)
Indirect Bilirubin: 0.9 mg/dL (ref 0.3–0.9)
Total Bilirubin: 1.1 mg/dL (ref 0.3–1.2)
Total Protein: 6.6 g/dL (ref 6.5–8.1)

## 2022-05-20 LAB — MAGNESIUM: Magnesium: 2.3 mg/dL (ref 1.7–2.4)

## 2022-05-20 LAB — TSH: TSH: 2.715 u[IU]/mL (ref 0.350–4.500)

## 2022-05-20 MED ORDER — CEFADROXIL 1 G PO TABS: 1.0000 g | ORAL_TABLET | Freq: Two times a day (BID) | ORAL | 0 refills | Status: AC

## 2022-05-20 MED ORDER — HYDROXYUREA 500 MG PO CAPS: ORAL_CAPSULE | ORAL | 0 refills | Status: DC

## 2022-05-20 NOTE — Plan of Care (Signed)
?  Problem: Clinical Measurements: ?Goal: Will remain free from infection ?Outcome: Progressing ?  ?

## 2022-05-20 NOTE — Progress Notes (Signed)
Discharge instructions reviewed, all questions answered. All belongings given. IV removed per protocol, tolerated well. Intact. Vital signs stable.

## 2022-05-20 NOTE — Progress Notes (Signed)
24 hour chart audit completed 

## 2022-05-20 NOTE — Discharge Summary (Signed)
Physician Discharge Summary  Jordan Vazquez NGE:[id number] DOB: August 28, 1946 DOA: 05/17/2022  PCP: [name], MD  Admit date: 05/17/2022 Discharge date: 05/20/2022 Admitted From: Home Disposition: Home Recommendations for Outpatient Follow-up:  Follow ups as below. Please obtain CMP and CBC at follow-up Please follow up on the following pending results: None  Home Health: Not indicated Equipment/Devices: None indicated  Discharge Condition: Stable CODE STATUS: Full code  Follow-up Information     [name], MD. Schedule an appointment as soon as possible for a visit in 1 week(s).   Specialty: Internal Medicine Contact information: 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 Irene Limbo, chronic lower back pain, neuropathy, HTN, HLD and osteoarthritis involving multiple joints presenting with altered mental status, nausea and vomiting, and admitted with acute metabolic encephalopathy in the setting of UTI and dehydration.  CT head without acute finding.  Ammonia 43.  MRI thoracic and lumbar spine without acute finding.  Patient was started on IVF and IV ceftriaxone with improvement in his mental status.  On the day of discharge, encephalopathy resolved.  Blood cultures NGTD.  Urine culture with multiple species.  Urinary symptoms resolved as well.  Leukopenia improved.  He is discharged on p.o. cefadroxil 1 g twice daily for 3 more days.   He was evaluated by therapy and cleared.  No need identified.  Outpatient follow-up with PCP in 1 to 2 weeks.  Outpatient follow-up with oncology as previously planned.  See individual problem list below for more.   Problems addressed during this hospitalization Principal Problem:   AMS (altered mental status) Active Problems:   Acute metabolic encephalopathy   Dehydration   Acute cystitis   At risk for  polypharmacy   Osteoarthritis of knee   Neurogenic claudication   Neuropathy   Nausea and vomiting   Acute metabolic encephalopathy: Likely due to UTI, dehydration and possibly polypharmacy.  Resolved.   Acute cystitis: UA concerning.  Urine culture with multiple species.  Patient presented with dysuria.  -Received IV ceftriaxone for 3 days.  Discharged on p.o. cefadroxil 1 g twice daily for 3 more days.   Elevated AST/hyperbilirubinemia: Hyperbilirubinemia resolved.  AST stable.  CK within normal. -Check CMP at follow-up   Nausea and vomiting: Resolved.  Tolerating regular diet.   Physical deconditioning: Resolved.   Osteoarthritis involving multiple joints -Continue home medications.   HLD -Continue home statin.   Leukopenia with lymphopenia/macrocytosis: Leukopenia improved.  V25 and folic acid within normal. -Recheck CBC at follow-up   At risk for polypharmacy: On a very significant dose of gabapentin and baclofen           Vital signs Vitals:   05/19/22 1330 05/19/22 2120 05/20/22 0232 05/20/22 0825  BP: (!) 121/96 125/84 101/75 128/83  Pulse: 67 65 88 (!) 59  Temp: (!) 97.4 F (36.3 C) 98.1 F (36.7 C) 98 F (36.7 C) 98.1 F (36.7 C)  Resp: '18 18 18 15  '$ Height:      Weight:      SpO2: 98% 100% 97% 100%  TempSrc: Oral Oral Oral Oral  BMI (Calculated):         Discharge exam  GENERAL: No apparent distress.  Nontoxic. HEENT: MMM.  Vision and hearing grossly intact.  NECK: Supple.  No apparent JVD.  RESP:  No IWOB.  Fair aeration bilaterally. CVS:  RRR. Heart sounds normal.  ABD/GI/GU: BS+. Abd soft, NTND.  MSK/EXT:  Moves extremities. No apparent deformity. No edema.  SKIN: no apparent skin lesion or wound NEURO: Awake and alert. Oriented appropriately.  No apparent focal neuro deficit. PSYCH: Calm. Normal affect.   Discharge Instructions Discharge Instructions     Call MD for:  difficulty breathing, headache or visual disturbances   Complete by:  As directed    Call MD for:  extreme fatigue   Complete by: As directed    Call MD for:  persistant dizziness or light-headedness   Complete by: As directed    Call MD for:  persistant nausea and vomiting   Complete by: As directed    Call MD for:  temperature >100.4   Complete by: As directed    Diet - low sodium heart healthy   Complete by: As directed    Discharge instructions   Complete by: As directed    It has been a pleasure taking care of you!  You were hospitalized due to altered mental status and urinary tract infection for which you have been treated with IV antibiotics.  Your symptoms improved.  We are discharging you on more antibiotics to complete treatment course.  Please follow-up with your primary care doctor in 1 to 2 weeks or sooner if needed.   Take care,   Increase activity slowly   Complete by: As directed       Allergies as of 05/20/2022       Reactions   Niacin    Other reaction(s): burning all over feeling   Niacin And Related Itching   Duloxetine Hcl Rash   Simvastatin Rash        Medication List     STOP taking these medications    hydroxyurea 500 MG capsule Commonly known as: HYDREA       TAKE these medications    acetaminophen 500 MG tablet Commonly known as: TYLENOL Take 1,000 mg by mouth every 4 (four) hours as needed for moderate pain.   aspirin 325 MG tablet Take 325 mg by mouth daily.   atorvastatin 40 MG tablet Commonly known as: LIPITOR Take 40 mg by mouth every morning.   baclofen 10 MG tablet Commonly known as: LIORESAL Take 10 mg by mouth 2 (two) times daily.   cefadroxil 1 g tablet Commonly known as: DURICEF Take 1 tablet (1 g total) by mouth 2 (two) times daily for 3 days.   clotrimazole-betamethasone cream Commonly known as: LOTRISONE Apply 1 Application topically 2 (two) times daily as needed (itching).   gabapentin 300 MG capsule Commonly known as: NEURONTIN Take 900 mg by mouth 3 (three) times  daily.   gabapentin 300 MG capsule Commonly known as: NEURONTIN Take 300 mg by mouth 3 (three) times daily.   hydrochlorothiazide 12.5 MG tablet Commonly known as: HYDRODIURIL Take 12.5 mg by mouth every morning.   multivitamin with minerals Tabs tablet Take 2 tablets by mouth daily.   oxyCODONE-acetaminophen 7.5-325 MG tablet Commonly known as: PERCOCET Take 1 tablet by mouth every 6 (six) hours.   SEA-OMEGA 30 PO Take 1 capsule by mouth daily.   Vitamin D-3 25 MCG (1000 UT) Caps Take 1,000 Units by mouth daily.        Consultations: None  Procedures/Studies:   MR THORACIC SPINE WO CONTRAST  Result Date: 05/17/2022 CLINICAL DATA:  Back pain.  Neurological deficit EXAM: MRI THORACIC SPINE WITHOUT CONTRAST TECHNIQUE: Multiplanar, multisequence MR  imaging of the thoracic spine was performed. No intravenous contrast was administered. COMPARISON:  No prior thoracic imaging. FINDINGS: Alignment:  No malalignment. Vertebrae: No fracture or focal bone lesion. Cord:  No cord compression or focal cord lesion. Paraspinal and other soft tissues: No significant paravertebral finding. Disc levels: Bulging of the discs at T1-2, T2-3, T3-4, T5-6, T6-7, T9-10, T10-11 and T11-12, narrowing the ventral subarachnoid space but not causing cord compression. Mild foraminal stenosis present at T1-2, T2-3 and T3-4. Other foramina appear widely patent. IMPRESSION: No advanced finding. Disc bulges at multiple thoracic levels as outlined above, but no apparent compressive stenosis of the central canal. Mild bilateral foraminal narrowing at T1-2, T2-3 and T3-4. Electronically Signed   By: Nelson Chimes M.D.   On: 05/17/2022 17:16   MR LUMBAR SPINE WO CONTRAST  Result Date: 05/17/2022 CLINICAL DATA:  Low back pain. Cauda equina syndrome suspected. Question epidural abscess. EXAM: MRI LUMBAR SPINE WITHOUT CONTRAST TECHNIQUE: Multiplanar, multisequence MR imaging of the lumbar spine was performed. No  intravenous contrast was administered. COMPARISON:  08/05/2019 FINDINGS: Segmentation:  5 lumbar type vertebral bodies. Alignment: Chronic degenerative anterolisthesis at L4-5 measuring 6-7 mm. 3 mm retrolisthesis L1-2. Vertebrae:  No fracture or focal bone lesion. Conus medullaris and cauda equina: Conus extends to the T12-L1 level. Conus and cauda equina appear normal. Paraspinal and other soft tissues: Negative Disc levels: T12-L1: Mild bulging of the disc.  No compressive stenosis. L1-2: 3 mm retrolisthesis. Mild stenosis of both lateral recesses but no definite neural compression. L2-3: Bulging of the disc more prominent towards the left. Mild stenosis of the left lateral recess could possibly affect the left L3 nerve. Similar appearance to the prior exam. L3-4: Mild bulging of the disc. Facet and ligamentous hypertrophy. No compressive stenosis. L4-5: Chronic facet arthropathy with 6-7 mm of anterolisthesis. Bulging of the disc. Very severe multifactorial spinal stenosis likely to be symptomatic. Similar appearance to the study of 2020. L5-S1: Mild bulging of the disc. Bilateral facet degeneration and hypertrophy. Bilateral subarticular lateral recess stenosis with some potential to affect the S1 nerves. Similar appearance to the prior study. 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 L5-S1 bilateral subarticular lateral recess stenosis that could possibly affect either S1 nerve, similar to the examination of September 2020. Left lateral recess stenosis at L2-3 that could possibly affect the left L3 nerve, similar to the study of September 2020. Lesser, grossly non-compressive degenerative changes at the other levels as above. Electronically Signed   By: Nelson Chimes M.D.   On: 05/17/2022 17:13   CT HEAD WO CONTRAST (5MM)  Result Date: 05/17/2022 CLINICAL DATA:  Mental status change, unknown cause. EXAM: CT HEAD WITHOUT CONTRAST TECHNIQUE:  Contiguous axial images were obtained from the base of the skull through the vertex without intravenous contrast. RADIATION DOSE REDUCTION: This exam was performed according to the departmental dose-optimization program which includes automated exposure control, adjustment of the mA and/or kV according to patient size and/or use of iterative reconstruction technique. COMPARISON:  CT head 02/25/2010. FINDINGS: Brain: Mild generalized volume loss, within normal limits for patient age, and associated dilatation of the ventricles. Ventricular size is unchanged compared to 02/25/2010. No evidence of acute infarction, hemorrhage, hydrocephalus, extra-axial collection or mass lesion/mass effect. Vascular: No hyperdense vessel or unexpected calcification. Skull: Normal. Negative for fracture or focal lesion. Sinuses/Orbits: No acute finding. Trace mucosal thickening in the right frontal sinus. The other paranasal sinuses and mastoid air cells  are clear. Other: None. IMPRESSION: No acute intracranial abnormality. Electronically Signed   By: Ileana Roup M.D.   On: 05/17/2022 13:54   DG Shoulder Left  Result Date: 05/17/2022 CLINICAL DATA:  Left shoulder pain, fall, initial encounter. EXAM: LEFT SHOULDER - 2+ VIEW COMPARISON:  None Available. FINDINGS: Slight superior subluxation of the lateral left clavicle with respect to the acromion, with moderate degenerative changes. No fracture. Visualized chest is unremarkable. IMPRESSION: 1. Minimal superior subluxation of the lateral left clavicle with respect to the acromion, of uncertain acuity. There are fairly extensive degenerative changes in the acromioclavicular joint and this may be chronic. Please correlate clinically. 2. Otherwise, no acute findings. Electronically Signed   By: Lorin Picket M.D.   On: 05/17/2022 13:41   DG Chest Port 1 View  Result Date: 05/17/2022 CLINICAL DATA:  Provided history: Questionable sepsis-evaluate for abnormality. Weakness, altered  mental status, shoulder pain. EXAM: PORTABLE CHEST 1 VIEW COMPARISON:  Prior chest radiographs 04/01/2011 and earlier. FINDINGS: Borderline cardiomegaly. Aortic atherosclerosis. No appreciable airspace consolidation or pulmonary edema. No evidence of pleural effusion or pneumothorax. No acute bony abnormality identified. Degenerative changes of the spine. IMPRESSION: No evidence of acute cardiopulmonary abnormality. Borderline cardiomegaly. Aortic Atherosclerosis (ICD10-I70.0). Electronically Signed   By: Kellie Simmering D.O.   On: 05/17/2022 13:39       The results of significant diagnostics from this hospitalization (including imaging, microbiology, ancillary and laboratory) are listed below for reference.     Microbiology: Recent Results (from the past 240 hour(s))  Blood Culture (routine x 2)     Status: None (Preliminary result)   Collection Time: 05/17/22 12:43 PM   Specimen: BLOOD  Result Value Ref Range Status   Specimen Description   Final    BLOOD LEFT ANTECUBITAL Performed at [facility] [address] [place], [place] [address]    Special Requests   Final    BOTTLES DRAWN AEROBIC AND ANAEROBIC Blood Culture results may not be optimal due to an inadequate volume of blood received in culture bottles Performed at [facility] [address] [place], [place] [address]    Culture   Final    NO GROWTH 3 DAYS Performed at [facility], [place] [address], [place], [place] [address]    Report Status PENDING  Incomplete  Urine Culture     Status: Abnormal   Collection Time: 05/17/22 12:43 PM   Specimen: In/Out Cath Urine  Result Value Ref Range Status   Specimen Description   Final    IN/OUT CATH URINE Performed at [facility] [address] [place], [place] [address]    Special Requests   Final    NONE Performed at [facility], [place] [address], [place],  [address]    Culture MULTIPLE  SPECIES PRESENT, SUGGEST RECOLLECTION (A)  Final   Report Status 05/18/2022 FINAL  Final  Blood Culture (routine x 2)     Status: None (Preliminary result)   Collection Time: 05/17/22  1:28 PM   Specimen: BLOOD  Result Value Ref Range Status   Specimen Description   Final    BLOOD BLOOD LEFT HAND Performed at [facility] [address], [place],  [address]    Special Requests   Final    BOTTLES DRAWN AEROBIC AND ANAEROBIC Blood Culture results may not be optimal due to an inadequate volume of blood received in culture bottles Performed at [facility],  [place] [address], [place], [place] [address]    Culture   Final    NO GROWTH 3 DAYS Performed at [facility], [place] [address], [place], [place] [address]    Report Status PENDING  Incomplete  Resp Panel by RT-PCR (Flu A&B, Covid) Anterior Nasal Swab     Status: None   Collection Time: 05/17/22  5:23 PM   Specimen: Anterior Nasal Swab  Result Value Ref Range Status   SARS Coronavirus 2 by RT PCR NEGATIVE NEGATIVE Final    Comment: (NOTE) SARS-CoV-2 target nucleic acids are NOT DETECTED.  The SARS-CoV-2 RNA is generally detectable in upper respiratory specimens during the acute phase of infection. The lowest concentration of SARS-CoV-2 viral copies this assay can detect is 138 copies/mL. A negative result does not preclude SARS-Cov-2 infection and should not be used as the sole basis for treatment or other patient management decisions. A negative result may occur with  improper specimen collection/handling, submission of specimen other than nasopharyngeal swab, presence of viral mutation(s) within the areas targeted by this assay, and inadequate number of viral copies(<138 copies/mL). A negative result must be combined with clinical observations, patient history, and epidemiological information. The expected result is Negative.  Fact Sheet for Patients:   EntrepreneurPulse.com.au  Fact Sheet for Healthcare Providers:  IncredibleEmployment.be  This test is no t yet approved or cleared by the [place] FDA and  has been authorized for detection and/or diagnosis of SARS-CoV-2 by FDA under an Emergency Use Authorization (EUA). This EUA will remain  in effect (meaning this test can be used) for the duration of the COVID-19 declaration under Section 564(b)(1) of the Act, 21 U.S.C.section 360bbb-3(b)(1), unless the authorization is terminated  or revoked sooner.       Influenza A by PCR NEGATIVE NEGATIVE Final   Influenza B by PCR NEGATIVE NEGATIVE Final    Comment: (NOTE) The Xpert Xpress SARS-CoV-2/FLU/RSV plus assay is intended as an aid in the diagnosis of influenza from Nasopharyngeal swab specimens and should not be used as a sole basis for treatment. Nasal washings and aspirates are unacceptable for Xpert Xpress SARS-CoV-2/FLU/RSV testing.  Fact Sheet for Patients: EntrepreneurPulse.com.au  Fact Sheet for Healthcare Providers: IncredibleEmployment.be  This test is not yet approved or cleared by the [place] FDA and has been authorized for detection and/or diagnosis of SARS-CoV-2 by FDA under an Emergency Use Authorization (EUA). This EUA will remain in effect (meaning this test can be used) for the duration of the COVID-19 declaration under Section 564(b)(1) of the Act, 21 U.S.C. section 360bbb-3(b)(1), unless the authorization is terminated or revoked.  Performed at [facility], [place] [address] [place], [place] [address]      Labs:  CBC: Recent Labs  Lab 05/18/22 0612 05/19/22 0334 05/20/22 0335  WBC 2.5* 2.9* 3.8*  NEUTROABS 1.9 1.8  --   HGB 15.6 14.5 14.4  HCT 43.8 40.6 41.7  MCV 110.6* 109.7* 110.3*  PLT 191 170 183   BMP &GFR Recent Labs  Lab 05/17/22 1243 05/18/22 0612 05/19/22 0334 05/20/22 0335   NA 136 136 138 139  K 4.0 3.8 3.6 3.7  CL 104 99 103 103  CO2 '22 27 25 27  '$ GLUCOSE 111* 126* 107* 100*  BUN '17 17 20 20  '$ CREATININE 0.76 0.92 0.79 0.70  CALCIUM 8.9 9.2 8.8* 8.6*  MG  --  2.1  --  2.3  PHOS  --  3.0  --  3.3  Estimated Creatinine Clearance: 86.2 mL/min (by C-G formula based on SCr of 0.7 mg/dL). Liver & Pancreas: Recent Labs  Lab 05/17/22 1243 05/18/22 0612 05/20/22 0335  AST 45* 47* 51*  ALT 28 31 39  ALKPHOS 48 48 44  BILITOT 1.2 1.6* 1.1  PROT 7.6 7.7 6.6  ALBUMIN 4.5 4.1 3.5  3.6   No results for input(s): "LIPASE", "AMYLASE" in the last 168 hours. Recent Labs  Lab 05/17/22 1243  AMMONIA 43*   Diabetic: No results for input(s): "HGBA1C" in the last 72 hours. No results for input(s): "GLUCAP" in the last 168 hours. Cardiac Enzymes: Recent Labs  Lab 05/20/22 0335  CKTOTAL 53   No results for input(s): "PROBNP" in the last 8760 hours. Coagulation Profile: Recent Labs  Lab 05/17/22 1243  INR 1.1   Thyroid Function Tests: Recent Labs    05/20/22 0335  TSH 2.715   Lipid Profile: No results for input(s): "CHOL", "HDL", "LDLCALC", "TRIG", "CHOLHDL", "LDLDIRECT" in the last 72 hours. Anemia Panel: Recent Labs    05/18/22 1406  VITAMINB12 317  FOLATE 35.5   Urine analysis:    Component Value Date/Time   COLORURINE YELLOW 05/17/2022 [facility] 05/17/2022 1243   LABSPEC 1.017 05/17/2022 1243   PHURINE 6.0 05/17/2022 1243   GLUCOSEU NEGATIVE 05/17/2022 1243   HGBUR MODERATE (A) 05/17/2022 1243   BILIRUBINUR NEGATIVE 05/17/2022 1243   KETONESUR 5 (A) 05/17/2022 1243   PROTEINUR 30 (A) 05/17/2022 1243   UROBILINOGEN 0.2 09/17/2011 1014   NITRITE NEGATIVE 05/17/2022 1243   LEUKOCYTESUR NEGATIVE 05/17/2022 1243   Sepsis Labs: Invalid input(s): "PROCALCITONIN", "LACTICIDVEN"   SIGNED:  Mercy Riding, MD  [facility] 05/20/2022, 1:33 PM

## 2022-05-22 LAB — CULTURE, BLOOD (ROUTINE X 2)
Culture: NO GROWTH
Culture: NO GROWTH

## 2022-05-25 DIAGNOSIS — M9903 Segmental and somatic dysfunction of lumbar region: Secondary | ICD-10-CM | POA: Diagnosis not present

## 2022-05-25 DIAGNOSIS — M62830 Muscle spasm of back: Secondary | ICD-10-CM | POA: Diagnosis not present

## 2022-05-25 DIAGNOSIS — M5441 Lumbago with sciatica, right side: Secondary | ICD-10-CM | POA: Diagnosis not present

## 2022-05-25 DIAGNOSIS — G894 Chronic pain syndrome: Secondary | ICD-10-CM | POA: Diagnosis not present

## 2022-05-25 DIAGNOSIS — M150 Primary generalized (osteo)arthritis: Secondary | ICD-10-CM | POA: Diagnosis not present

## 2022-05-25 DIAGNOSIS — Z79891 Long term (current) use of opiate analgesic: Secondary | ICD-10-CM | POA: Diagnosis not present

## 2022-05-25 DIAGNOSIS — M47817 Spondylosis without myelopathy or radiculopathy, lumbosacral region: Secondary | ICD-10-CM | POA: Diagnosis not present

## 2022-05-25 DIAGNOSIS — M5412 Radiculopathy, cervical region: Secondary | ICD-10-CM | POA: Diagnosis not present

## 2022-05-25 DIAGNOSIS — M9901 Segmental and somatic dysfunction of cervical region: Secondary | ICD-10-CM | POA: Diagnosis not present

## 2022-05-26 DIAGNOSIS — N390 Urinary tract infection, site not specified: Secondary | ICD-10-CM | POA: Diagnosis not present

## 2022-05-26 DIAGNOSIS — D7589 Other specified diseases of blood and blood-forming organs: Secondary | ICD-10-CM | POA: Diagnosis not present

## 2022-05-26 DIAGNOSIS — R718 Other abnormality of red blood cells: Secondary | ICD-10-CM | POA: Diagnosis not present

## 2022-06-03 ENCOUNTER — Other Ambulatory Visit: Payer: Self-pay

## 2022-06-03 DIAGNOSIS — H26492 Other secondary cataract, left eye: Secondary | ICD-10-CM | POA: Diagnosis not present

## 2022-06-03 DIAGNOSIS — H26491 Other secondary cataract, right eye: Secondary | ICD-10-CM | POA: Diagnosis not present

## 2022-06-03 DIAGNOSIS — D473 Essential (hemorrhagic) thrombocythemia: Secondary | ICD-10-CM

## 2022-06-07 ENCOUNTER — Inpatient Hospital Stay (HOSPITAL_BASED_OUTPATIENT_CLINIC_OR_DEPARTMENT_OTHER): Payer: Medicare Other | Admitting: Hematology

## 2022-06-07 ENCOUNTER — Other Ambulatory Visit: Payer: Self-pay

## 2022-06-07 ENCOUNTER — Inpatient Hospital Stay: Payer: Medicare Other | Attending: Hematology

## 2022-06-07 VITALS — BP 149/68 | HR 51 | Temp 97.9°F | Resp 18 | Wt 207.1 lb

## 2022-06-07 DIAGNOSIS — D473 Essential (hemorrhagic) thrombocythemia: Secondary | ICD-10-CM | POA: Diagnosis not present

## 2022-06-07 DIAGNOSIS — D75839 Thrombocytosis, unspecified: Secondary | ICD-10-CM | POA: Insufficient documentation

## 2022-06-07 DIAGNOSIS — Z87891 Personal history of nicotine dependence: Secondary | ICD-10-CM | POA: Insufficient documentation

## 2022-06-07 DIAGNOSIS — I10 Essential (primary) hypertension: Secondary | ICD-10-CM | POA: Diagnosis not present

## 2022-06-07 DIAGNOSIS — Z888 Allergy status to other drugs, medicaments and biological substances status: Secondary | ICD-10-CM | POA: Diagnosis not present

## 2022-06-07 DIAGNOSIS — Z7289 Other problems related to lifestyle: Secondary | ICD-10-CM | POA: Insufficient documentation

## 2022-06-07 DIAGNOSIS — R161 Splenomegaly, not elsewhere classified: Secondary | ICD-10-CM | POA: Insufficient documentation

## 2022-06-07 DIAGNOSIS — Z79899 Other long term (current) drug therapy: Secondary | ICD-10-CM | POA: Diagnosis not present

## 2022-06-07 DIAGNOSIS — Z87440 Personal history of urinary (tract) infections: Secondary | ICD-10-CM | POA: Diagnosis not present

## 2022-06-07 LAB — CBC WITH DIFFERENTIAL (CANCER CENTER ONLY)
Abs Immature Granulocytes: 0.01 10*3/uL (ref 0.00–0.07)
Basophils Absolute: 0 10*3/uL (ref 0.0–0.1)
Basophils Relative: 1 %
Eosinophils Absolute: 0.1 10*3/uL (ref 0.0–0.5)
Eosinophils Relative: 3 %
HCT: 41.8 % (ref 39.0–52.0)
Hemoglobin: 14.8 g/dL (ref 13.0–17.0)
Immature Granulocytes: 0 %
Lymphocytes Relative: 18 %
Lymphs Abs: 0.8 10*3/uL (ref 0.7–4.0)
MCH: 38.5 pg — ABNORMAL HIGH (ref 26.0–34.0)
MCHC: 35.4 g/dL (ref 30.0–36.0)
MCV: 108.9 fL — ABNORMAL HIGH (ref 80.0–100.0)
Monocytes Absolute: 0.5 10*3/uL (ref 0.1–1.0)
Monocytes Relative: 12 %
Neutro Abs: 2.9 10*3/uL (ref 1.7–7.7)
Neutrophils Relative %: 66 %
Platelet Count: 263 10*3/uL (ref 150–400)
RBC: 3.84 MIL/uL — ABNORMAL LOW (ref 4.22–5.81)
RDW: 13 % (ref 11.5–15.5)
WBC Count: 4.4 10*3/uL (ref 4.0–10.5)
nRBC: 0 % (ref 0.0–0.2)

## 2022-06-07 LAB — CMP (CANCER CENTER ONLY)
ALT: 18 U/L (ref 0–44)
AST: 24 U/L (ref 15–41)
Albumin: 4.4 g/dL (ref 3.5–5.0)
Alkaline Phosphatase: 61 U/L (ref 38–126)
Anion gap: 6 (ref 5–15)
BUN: 18 mg/dL (ref 8–23)
CO2: 30 mmol/L (ref 22–32)
Calcium: 9.3 mg/dL (ref 8.9–10.3)
Chloride: 104 mmol/L (ref 98–111)
Creatinine: 0.87 mg/dL (ref 0.61–1.24)
GFR, Estimated: >60 mL/min (ref >60)
Glucose, Bld: 82 mg/dL (ref 70–99)
Potassium: 3.9 mmol/L (ref 3.5–5.1)
Sodium: 140 mmol/L (ref 135–145)
Total Bilirubin: 0.8 mg/dL (ref 0.3–1.2)
Total Protein: 7.3 g/dL (ref 6.5–8.1)

## 2022-06-07 MED ORDER — HYDROXYUREA 500 MG PO CAPS: ORAL_CAPSULE | ORAL | 1 refills | Status: DC

## 2022-06-07 NOTE — Progress Notes (Signed)
HEMATOLOGY ONCOLOGY PROGRESS NOTE  Date of service:  06/07/2022   Patient Care Team: [name], MD as PCP - General (Internal Medicine)  CC Follow-up for continued evaluation and management of essential thrombocythemia  Diagnosis:  JAK2 Essential thrombocytosis   Current Treatment: Hydroxyurea 1000 mg daily  INTERVAL HISTORY: [name] is a 76 y.o. male here for continued evaluation and management of his JAK2 positive essential thrombocytosis. He reports He is doing well with no new symptoms or concerns.  He reports recent fever of 103 on 05/17/2022 with urinary incontinence. He notes that he is feeling better today and his symptoms have since resolved. He was treated for sepsis related to UTI.  We discussed that if he notices weak stream or other urinary symptoms then he is advised to follow up with urology.  He notes no issues with tolerating his current dose of hydroxyurea.  No new leg pain or swelling. No fevers no chills no night sweats. No infection issues. No abnormal bleeding or bruising. No other new or acute focal symptoms.  Labs done today were reviewed in detail.  REVIEW OF SYSTEMS:   10 Point review of Systems was done is negative except as noted above.   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    Hypertension     Past Surgical History:  Procedure Laterality Date   CARDIAC CATHETERIZATION     16 years ago negative    HERNIA REPAIR     right inguinal hernia surgery    I & D KNEE WITH POLY EXCHANGE  12/06/2011   Procedure: IRRIGATION AND DEBRIDEMENT KNEE WITH POLY EXCHANGE;  Surgeon: Mauri Pole, MD;  Location: [facility];  Service: Orthopedics;  Laterality: Right;  Right Total Knee Irrigation and Debridement with Poly Exchange   JOINT REPLACEMENT     left knee 5/12, right TKA  10/12     Social History   Tobacco Use   Smoking status: Former    Packs/day: 0.25    Types: Cigarettes    Quit date: 11/16/1963    Years since quitting: 58.5   Smokeless tobacco: Never  Vaping Use   Vaping Use: Never used  Substance Use Topics   Alcohol use: Yes    Alcohol/week: 2.0 standard drinks of alcohol    Types: 2 Cans of beer per week   Drug use: No    ALLERGIES:  is allergic to niacin, niacin and related, duloxetine hcl, and simvastatin.  MEDICATIONS:  Current Outpatient Medications  Medication Sig Dispense Refill   acetaminophen (TYLENOL) 500 MG tablet Take 1,000 mg by mouth every 4 (four) hours as needed for moderate pain.     aspirin 325 MG tablet Take 325 mg by mouth daily.      atorvastatin (LIPITOR) 40 MG tablet Take 40 mg by mouth every morning.     baclofen (LIORESAL) 10 MG tablet Take 10 mg by mouth 2 (two) times daily.  0   Cholecalciferol (VITAMIN D-3) 1000 UNITS CAPS Take 1,000 Units by mouth daily.     clotrimazole-betamethasone (LOTRISONE) cream Apply 1 Application topically 2 (two) times daily as needed (itching).     gabapentin (NEURONTIN) 300 MG capsule Take 300 mg by mouth 3 (three) times daily.     gabapentin (NEURONTIN) 300 MG capsule Take 900 mg by mouth 3 (three) times daily.  hydrochlorothiazide (HYDRODIURIL) 12.5 MG tablet Take 12.5 mg by mouth every morning.     hydroxyurea (HYDREA) 500 MG capsule Take 2 capsules (1,000 mg total) by mouth daily. May take with food to minimize GI side effects. 180 capsule 0   Multiple Vitamin (MULITIVITAMIN WITH MINERALS) TABS Take 2 tablets by mouth daily.      Omega-3 Fatty Acids (SEA-OMEGA 30 PO) Take 1 capsule by mouth daily.     oxyCODONE-acetaminophen (PERCOCET) 7.5-325 MG tablet Take 1 tablet by mouth every 6 (six) hours.     No current facility-administered medications for this visit.    PHYSICAL EXAMINATION: ECOG PERFORMANCE STATUS: 1 - Symptomatic but completely ambulatory  Vitals:   06/07/22  0855  BP: (!) 149/68  Pulse: (!) 51  Resp: 18  Temp: 97.9 F (36.6 C)  SpO2: 98%   Filed Weights   06/07/22 0855  Weight: 207 lb 1.6 oz (93.9 kg)   .Body mass index is 28.09 kg/m.  NAD GENERAL:alert, in no acute distress and comfortable SKIN: no acute rashes, no significant lesions EYES: conjunctiva are pink and non-injected, sclera anicteric NECK: supple, no JVD LYMPH:  no palpable lymphadenopathy in the cervical, axillary or inguinal regions LUNGS: clear to auscultation b/l with normal respiratory effort HEART: regular rate & rhythm ABDOMEN:  normoactive bowel sounds , non tender, not distended. Extremity: no pedal edema PSYCH: alert & oriented x 3 with fluent speech NEURO: no focal motor/sensory deficits  Exam performed in chair.  LABORATORY DATA:   I have reviewed the data as listed  .    Latest Ref Rng & Units 06/07/2022    7:37 AM 05/20/2022    3:35 AM 05/19/2022    3:34 AM  CBC  WBC 4.0 - 10.5 K/uL 4.4  3.8  2.9   Hemoglobin 13.0 - 17.0 g/dL 14.8  14.4  14.5   Hematocrit 39.0 - 52.0 % 41.8  41.7  40.6   Platelets 150 - 400 K/uL 263  183  170       Latest Ref Rng & Units 06/07/2022    7:37 AM 05/20/2022    3:35 AM 05/19/2022    3:34 AM  CMP  Glucose 70 - 99 mg/dL 82  100  107   BUN 8 - 23 mg/dL '18  20  20   '$ Creatinine 0.61 - 1.24 mg/dL 0.87  0.70  0.79   Sodium 135 - 145 mmol/L 140  139  138   Potassium 3.5 - 5.1 mmol/L 3.9  3.7  3.6   Chloride 98 - 111 mmol/L 104  103  103   CO2 22 - 32 mmol/L '30  27  25   '$ Calcium 8.9 - 10.3 mg/dL 9.3  8.6  8.8   Total Protein 6.5 - 8.1 g/dL 7.3  6.6    Total Bilirubin 0.3 - 1.2 mg/dL 0.8  1.1    Alkaline Phos 38 - 126 U/L 61  44    AST 15 - 41 U/L 24  51    ALT 0 - 44 U/L 18  39        RADIOGRAPHIC STUDIES: I have personally reviewed the radiological images as listed and agreed with the findings in the report.  [place] abd 03/15/2017: IMPRESSION: 1. Liver is echogenic consistent with fatty infiltration  and/or hepatocellular disease. No focal hepatic abnormality. No gallstones or biliary distention.   2. Spleen is enlarged at 14.4 cm with a volume of 620 cc.    Electronically Signed  By: Creighton   On: 03/15/2017 09:15   ASSESSMENT & PLAN: '  76 y.o.  Caucasian male with   #1  JAK2 positive essential thrombocytosis  PLAN: -Discussed lab results from today. -CBC shows normal hemoglobin of 14.8, platelets of 263k and WBC count of 4.4k -CMP within normal limits -No toxicities from his current use of hydroxyurea. -Continue aspirin 81 mg p.o. daily. -No indication for complications from his essential thrombocytosis at this time. -We shall continue to monitor his platelet counts and other labs every 4 months and prior to use the lowest effective dose of hydroxyurea to maintain platelets between 200-400k -We discussed that if he notices weak stream or other urinary symptoms then he is advised to follow up with urology.  #2 Abnormal LFTs - AST and ALT have normalized on labs today.  Likely from fatty liver. -will continue to monitor.   FOLLOW UP: Return to clinic with Dr. Irene Limbo with labs in 4 months  The total time spent in the appointment was 15 minutes*.  All of the patient's questions were answered with apparent satisfaction. The patient knows to call the clinic with any problems, questions or concerns.   Sullivan Lone MD MS AAHIVMS [facility] Hematology/Oncology Physician [facility]  .*Total Encounter Time as defined by the Centers for Medicare and Medicaid Services includes, in addition to the face-to-face time of a patient visit (documented in the note above) non-face-to-face time: obtaining and reviewing outside history, ordering and reviewing medications, tests or procedures, care coordination (communications with other health care professionals or caregivers) and documentation in the medical record.  I, Melene Muller, am acting as scribe for Dr. Sullivan Lone, MD. .I have reviewed the above documentation for accuracy and completeness, and I agree with the above. Brunetta Genera MD

## 2022-06-10 ENCOUNTER — Telehealth: Payer: Self-pay | Admitting: Hematology

## 2022-06-10 NOTE — Telephone Encounter (Signed)
Left message with follow-up appointment per 7/24 los.

## 2022-07-20 DIAGNOSIS — M62830 Muscle spasm of back: Secondary | ICD-10-CM | POA: Diagnosis not present

## 2022-07-20 DIAGNOSIS — G894 Chronic pain syndrome: Secondary | ICD-10-CM | POA: Diagnosis not present

## 2022-07-20 DIAGNOSIS — M150 Primary generalized (osteo)arthritis: Secondary | ICD-10-CM | POA: Diagnosis not present

## 2022-07-20 DIAGNOSIS — M47817 Spondylosis without myelopathy or radiculopathy, lumbosacral region: Secondary | ICD-10-CM | POA: Diagnosis not present

## 2022-08-04 DIAGNOSIS — D692 Other nonthrombocytopenic purpura: Secondary | ICD-10-CM | POA: Diagnosis not present

## 2022-08-04 DIAGNOSIS — D485 Neoplasm of uncertain behavior of skin: Secondary | ICD-10-CM | POA: Diagnosis not present

## 2022-08-04 DIAGNOSIS — L308 Other specified dermatitis: Secondary | ICD-10-CM | POA: Diagnosis not present

## 2022-08-04 DIAGNOSIS — L570 Actinic keratosis: Secondary | ICD-10-CM | POA: Diagnosis not present

## 2022-08-04 DIAGNOSIS — C4441 Basal cell carcinoma of skin of scalp and neck: Secondary | ICD-10-CM | POA: Diagnosis not present

## 2022-08-04 DIAGNOSIS — D1801 Hemangioma of skin and subcutaneous tissue: Secondary | ICD-10-CM | POA: Diagnosis not present

## 2022-08-17 DIAGNOSIS — Z79891 Long term (current) use of opiate analgesic: Secondary | ICD-10-CM | POA: Diagnosis not present

## 2022-08-17 DIAGNOSIS — M62830 Muscle spasm of back: Secondary | ICD-10-CM | POA: Diagnosis not present

## 2022-08-17 DIAGNOSIS — M150 Primary generalized (osteo)arthritis: Secondary | ICD-10-CM | POA: Diagnosis not present

## 2022-08-17 DIAGNOSIS — M47817 Spondylosis without myelopathy or radiculopathy, lumbosacral region: Secondary | ICD-10-CM | POA: Diagnosis not present

## 2022-08-17 DIAGNOSIS — G894 Chronic pain syndrome: Secondary | ICD-10-CM | POA: Diagnosis not present

## 2022-09-06 DIAGNOSIS — C4441 Basal cell carcinoma of skin of scalp and neck: Secondary | ICD-10-CM | POA: Diagnosis not present

## 2022-09-06 DIAGNOSIS — Z85828 Personal history of other malignant neoplasm of skin: Secondary | ICD-10-CM | POA: Diagnosis not present

## 2022-09-20 DIAGNOSIS — D473 Essential (hemorrhagic) thrombocythemia: Secondary | ICD-10-CM | POA: Diagnosis not present

## 2022-09-20 DIAGNOSIS — I10 Essential (primary) hypertension: Secondary | ICD-10-CM | POA: Diagnosis not present

## 2022-09-20 DIAGNOSIS — M75102 Unspecified rotator cuff tear or rupture of left shoulder, not specified as traumatic: Secondary | ICD-10-CM | POA: Diagnosis not present

## 2022-09-20 DIAGNOSIS — Z23 Encounter for immunization: Secondary | ICD-10-CM | POA: Diagnosis not present

## 2022-09-20 DIAGNOSIS — Z0000 Encounter for general adult medical examination without abnormal findings: Secondary | ICD-10-CM | POA: Diagnosis not present

## 2022-09-20 DIAGNOSIS — Z1331 Encounter for screening for depression: Secondary | ICD-10-CM | POA: Diagnosis not present

## 2022-10-04 ENCOUNTER — Inpatient Hospital Stay: Payer: Medicare Other | Attending: Hematology | Admitting: Hematology

## 2022-10-04 ENCOUNTER — Inpatient Hospital Stay: Payer: Medicare Other

## 2022-10-04 ENCOUNTER — Other Ambulatory Visit: Payer: Self-pay

## 2022-10-04 DIAGNOSIS — Z888 Allergy status to other drugs, medicaments and biological substances status: Secondary | ICD-10-CM | POA: Diagnosis not present

## 2022-10-04 DIAGNOSIS — D473 Essential (hemorrhagic) thrombocythemia: Secondary | ICD-10-CM | POA: Diagnosis not present

## 2022-10-04 DIAGNOSIS — R7989 Other specified abnormal findings of blood chemistry: Secondary | ICD-10-CM | POA: Insufficient documentation

## 2022-10-04 DIAGNOSIS — R161 Splenomegaly, not elsewhere classified: Secondary | ICD-10-CM | POA: Diagnosis not present

## 2022-10-04 DIAGNOSIS — Z79899 Other long term (current) drug therapy: Secondary | ICD-10-CM | POA: Insufficient documentation

## 2022-10-04 DIAGNOSIS — Z87891 Personal history of nicotine dependence: Secondary | ICD-10-CM | POA: Diagnosis not present

## 2022-10-04 DIAGNOSIS — D75839 Thrombocytosis, unspecified: Secondary | ICD-10-CM | POA: Diagnosis not present

## 2022-10-04 LAB — CBC WITH DIFFERENTIAL (CANCER CENTER ONLY)
Abs Immature Granulocytes: 0.01 10*3/uL (ref 0.00–0.07)
Basophils Absolute: 0 10*3/uL (ref 0.0–0.1)
Basophils Relative: 0 %
Eosinophils Absolute: 0.1 10*3/uL (ref 0.0–0.5)
Eosinophils Relative: 2 %
HCT: 42.7 % (ref 39.0–52.0)
Hemoglobin: 15 g/dL (ref 13.0–17.0)
Immature Granulocytes: 0 %
Lymphocytes Relative: 18 %
Lymphs Abs: 0.9 10*3/uL (ref 0.7–4.0)
MCH: 38.9 pg — ABNORMAL HIGH (ref 26.0–34.0)
MCHC: 35.1 g/dL (ref 30.0–36.0)
MCV: 110.6 fL — ABNORMAL HIGH (ref 80.0–100.0)
Monocytes Absolute: 0.7 10*3/uL (ref 0.1–1.0)
Monocytes Relative: 13 %
Neutro Abs: 3.4 10*3/uL (ref 1.7–7.7)
Neutrophils Relative %: 67 %
Platelet Count: 256 10*3/uL (ref 150–400)
RBC: 3.86 MIL/uL — ABNORMAL LOW (ref 4.22–5.81)
RDW: 13.3 % (ref 11.5–15.5)
WBC Count: 5.1 10*3/uL (ref 4.0–10.5)
nRBC: 0 % (ref 0.0–0.2)

## 2022-10-04 LAB — CMP (CANCER CENTER ONLY)
ALT: 16 U/L (ref 0–44)
AST: 25 U/L (ref 15–41)
Albumin: 4.8 g/dL (ref 3.5–5.0)
Alkaline Phosphatase: 63 U/L (ref 38–126)
Anion gap: 4 — ABNORMAL LOW (ref 5–15)
BUN: 17 mg/dL (ref 8–23)
CO2: 31 mmol/L (ref 22–32)
Calcium: 9.5 mg/dL (ref 8.9–10.3)
Chloride: 104 mmol/L (ref 98–111)
Creatinine: 0.82 mg/dL (ref 0.61–1.24)
GFR, Estimated: >60 mL/min (ref >60)
Glucose, Bld: 100 mg/dL — ABNORMAL HIGH (ref 70–99)
Potassium: 4.8 mmol/L (ref 3.5–5.1)
Sodium: 139 mmol/L (ref 135–145)
Total Bilirubin: 0.6 mg/dL (ref 0.3–1.2)
Total Protein: 7.8 g/dL (ref 6.5–8.1)

## 2022-10-04 NOTE — Progress Notes (Signed)
HEMATOLOGY ONCOLOGY PROGRESS NOTE  Date of service:  10/04/2022   Patient Care Team: [name], MD as PCP - General (Internal Medicine)  CC Follow-up for continued evaluation and management of essential thrombocythemia  Diagnosis:  JAK2 Essential thrombocytosis   Current Treatment: Hydroxyurea 1000 mg daily  INTERVAL HISTORY:  [name] is a 76 y.o. male here for continued evaluation and management of his JAK2 positive essential thrombocytosis.   Patient was last seen by me on 06/07/2022 and was dong well without any new medical concerns.   Patient reports he is doing well without any new concerns since our last visit.   He denies any new infections, fevers, chills, back pain, bone pain, abdominal pain, and leg swelling. He denies any side effects with hydroxyurea.  He is complaint with his medications.   He has received his influenza vaccine and COVID-19 booster, but denies RSV vaccine.    Patient mentions that his PCP changed to Dr. Syliva Overman, MD.  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    REVIEW OF SYSTEMS:   10 Point review of Systems was done is negative except as noted above.   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    Hypertension     Past Surgical History:  Procedure Laterality Date   CARDIAC CATHETERIZATION     16 years ago negative    HERNIA REPAIR     right inguinal hernia surgery    I & D KNEE WITH POLY EXCHANGE  12/06/2011   Procedure: IRRIGATION AND DEBRIDEMENT KNEE WITH POLY EXCHANGE;  Surgeon: Mauri Pole, MD;  Location: [facility];  Service: Orthopedics;  Laterality: Right;  Right Total Knee Irrigation and Debridement with Poly Exchange   JOINT REPLACEMENT     left knee 5/12, right TKA 10/12     Social History    Tobacco Use   Smoking status: Former    Packs/day: 0.25    Types: Cigarettes    Quit date: 11/16/1963    Years since quitting: 58.9   Smokeless tobacco: Never  Vaping Use   Vaping Use: Never used  Substance Use Topics   Alcohol use: Yes    Alcohol/week: 2.0 standard drinks of alcohol    Types: 2 Cans of beer per week   Drug use: No    ALLERGIES:  is allergic to niacin, niacin and related, duloxetine hcl, and simvastatin.  MEDICATIONS:  Current Outpatient Medications  Medication Sig Dispense Refill   acetaminophen (TYLENOL) 500 MG tablet Take 1,000 mg by mouth every 4 (four) hours as needed for moderate pain.     aspirin 325 MG tablet Take 325 mg by mouth daily.      atorvastatin (LIPITOR) 40 MG tablet Take 40 mg by mouth every morning.     baclofen (LIORESAL) 10 MG tablet Take 10 mg by mouth 2 (two) times daily.  0   Cholecalciferol (VITAMIN D-3) 1000 UNITS CAPS Take 1,000 Units by mouth daily.     clotrimazole-betamethasone (LOTRISONE) cream Apply 1 Application topically 2 (two) times daily as needed (itching).     gabapentin (NEURONTIN) 300 MG capsule Take 300 mg by mouth 3 (three) times daily.     gabapentin (NEURONTIN) 300 MG capsule Take 900 mg by mouth 3 (three) times daily.  hydrochlorothiazide (HYDRODIURIL) 12.5 MG tablet Take 12.5 mg by mouth every morning.     hydroxyurea (HYDREA) 500 MG capsule Take 2 capsules (1,000 mg total) by mouth daily on Monday/Tuesday/Wednesday/Thursday/Friday and 1 cap ('500mg'$ ) po daily on Saturday and sunday. May take with food to minimize GI side effects. 180 capsule 1   Multiple Vitamin (MULITIVITAMIN WITH MINERALS) TABS Take 2 tablets by mouth daily.      Omega-3 Fatty Acids (SEA-OMEGA 30 PO) Take 1 capsule by mouth daily.     oxyCODONE-acetaminophen (PERCOCET) 7.5-325 MG tablet Take 1 tablet by mouth every 6 (six) hours.     No current facility-administered medications for this visit.    PHYSICAL EXAMINATION: ECOG PERFORMANCE  STATUS: 1 - Symptomatic but completely ambulatory  Vitals:   10/04/22 0901  BP: (!) 151/75  Pulse: 71  Temp: 97.9 F (36.6 C)  SpO2: 97%    Filed Weights   10/04/22 0901  Weight: 207 lb 4.8 oz (94 kg)    .Body mass index is 28.11 kg/m.  NAD GENERAL:alert, in no acute distress and comfortable SKIN: no acute rashes, no significant lesions EYES: conjunctiva are pink and non-injected, sclera anicteric NECK: supple, no JVD LYMPH:  no palpable lymphadenopathy in the cervical, axillary or inguinal regions LUNGS: clear to auscultation b/l with normal respiratory effort HEART: regular rate & rhythm ABDOMEN:  normoactive bowel sounds , non tender, not distended. Extremity: no pedal edema PSYCH: alert & oriented x 3 with fluent speech NEURO: no focal motor/sensory deficits  Exam performed in chair.  LABORATORY DATA:   I have reviewed the data as listed  .    Latest Ref Rng & Units 10/04/2022    8:04 AM 06/07/2022    7:37 AM 05/20/2022    3:35 AM  CBC  WBC 4.0 - 10.5 K/uL 5.1  4.4  3.8   Hemoglobin 13.0 - 17.0 g/dL 15.0  14.8  14.4   Hematocrit 39.0 - 52.0 % 42.7  41.8  41.7   Platelets 150 - 400 K/uL 256  263  183       Latest Ref Rng & Units 10/04/2022    8:04 AM 06/07/2022    7:37 AM 05/20/2022    3:35 AM  CMP  Glucose 70 - 99 mg/dL 100  82  100   BUN 8 - 23 mg/dL '17  18  20   '$ Creatinine 0.61 - 1.24 mg/dL 0.82  0.87  0.70   Sodium 135 - 145 mmol/L 139  140  139   Potassium 3.5 - 5.1 mmol/L 4.8  3.9  3.7   Chloride 98 - 111 mmol/L 104  104  103   CO2 22 - 32 mmol/L '31  30  27   '$ Calcium 8.9 - 10.3 mg/dL 9.5  9.3  8.6   Total Protein 6.5 - 8.1 g/dL 7.8  7.3  6.6   Total Bilirubin 0.3 - 1.2 mg/dL 0.6  0.8  1.1   Alkaline Phos 38 - 126 U/L 63  61  44   AST 15 - 41 U/L 25  24  51   ALT 0 - 44 U/L 16  18  39       RADIOGRAPHIC STUDIES:  I have personally reviewed the radiological images as listed and agreed with the findings in the report.  [place] abd 03/15/2017:  IMPRESSION: 1. Liver is echogenic consistent with fatty infiltration and/or hepatocellular disease. No focal hepatic abnormality. No gallstones or biliary distention.   2. Spleen is enlarged  at 14.4 cm with a volume of 620 cc.    Electronically Signed   By: Marcello Moores  Register   On: 03/15/2017 09:15   ASSESSMENT & PLAN: '  76 y.o.  Caucasian male with   #1  JAK2 positive essential thrombocytosis #2 Abnormal LFTs - AST and ALT have normalized on labs today.  Likely from fatty liver. -will continue to monitor.  PLAN: -Discussed lab results from today. -CBC shows normal hemoglobin of 15.0 K, platelets of 256k and WBC count of 5.1k. -CMP within normal limits. -No toxicities from his current use of hydroxyurea and shall continue this. -Continue aspirin 81 mg p.o. daily. -No indication for complications from his essential thrombocytosis at this time. -Educated patient on RSV vaccine and recommended to get the RSV vaccine.     FOLLOW UP: RTC with Dr Irene Limbo with labs in 4 months   The total time spent in the appointment was 20 minutes* .  All of the patient's questions were answered with apparent satisfaction. The patient knows to call the clinic with any problems, questions or concerns.   Sullivan Lone MD MS AAHIVMS [facility] Hematology/Oncology Physician [facility]  .*Total Encounter Time as defined by the Centers for Medicare and Medicaid Services includes, in addition to the face-to-face time of a patient visit (documented in the note above) non-face-to-face time: obtaining and reviewing outside history, ordering and reviewing medications, tests or procedures, care coordination (communications with other health care professionals or caregivers) and documentation in the medical record.   Zettie Cooley, am acting as a Education administrator for Sullivan Lone, MD.

## 2022-10-10 MED ORDER — HYDROXYUREA 500 MG PO CAPS: ORAL_CAPSULE | ORAL | 1 refills | Status: DC

## 2022-10-12 DIAGNOSIS — M47817 Spondylosis without myelopathy or radiculopathy, lumbosacral region: Secondary | ICD-10-CM | POA: Diagnosis not present

## 2022-10-12 DIAGNOSIS — G894 Chronic pain syndrome: Secondary | ICD-10-CM | POA: Diagnosis not present

## 2022-10-12 DIAGNOSIS — M62830 Muscle spasm of back: Secondary | ICD-10-CM | POA: Diagnosis not present

## 2022-10-12 DIAGNOSIS — M150 Primary generalized (osteo)arthritis: Secondary | ICD-10-CM | POA: Diagnosis not present

## 2022-10-18 ENCOUNTER — Ambulatory Visit: Payer: Medicare Other | Admitting: Hematology

## 2022-10-18 ENCOUNTER — Other Ambulatory Visit: Payer: Medicare Other

## 2022-11-25 ENCOUNTER — Other Ambulatory Visit: Payer: Self-pay

## 2022-11-25 DIAGNOSIS — D473 Essential (hemorrhagic) thrombocythemia: Secondary | ICD-10-CM

## 2022-11-25 MED ORDER — HYDROXYUREA 500 MG PO CAPS: ORAL_CAPSULE | ORAL | 1 refills | Status: DC

## 2022-12-07 DIAGNOSIS — M47817 Spondylosis without myelopathy or radiculopathy, lumbosacral region: Secondary | ICD-10-CM | POA: Diagnosis not present

## 2022-12-07 DIAGNOSIS — G894 Chronic pain syndrome: Secondary | ICD-10-CM | POA: Diagnosis not present

## 2022-12-07 DIAGNOSIS — M62830 Muscle spasm of back: Secondary | ICD-10-CM | POA: Diagnosis not present

## 2022-12-07 DIAGNOSIS — M150 Primary generalized (osteo)arthritis: Secondary | ICD-10-CM | POA: Diagnosis not present

## 2022-12-07 DIAGNOSIS — Z79891 Long term (current) use of opiate analgesic: Secondary | ICD-10-CM | POA: Diagnosis not present

## 2022-12-24 DIAGNOSIS — K080 Exfoliation of teeth due to systemic causes: Secondary | ICD-10-CM | POA: Diagnosis not present

## 2023-01-28 ENCOUNTER — Other Ambulatory Visit: Payer: Self-pay | Admitting: *Deleted

## 2023-01-28 DIAGNOSIS — D473 Essential (hemorrhagic) thrombocythemia: Secondary | ICD-10-CM

## 2023-01-31 ENCOUNTER — Other Ambulatory Visit: Payer: Self-pay

## 2023-01-31 ENCOUNTER — Inpatient Hospital Stay: Payer: Medicare Other | Attending: Hematology

## 2023-01-31 ENCOUNTER — Inpatient Hospital Stay: Payer: Medicare Other | Admitting: Hematology

## 2023-01-31 VITALS — BP 128/75 | HR 89 | Temp 97.7°F | Resp 18 | Wt 207.9 lb

## 2023-01-31 DIAGNOSIS — Z888 Allergy status to other drugs, medicaments and biological substances status: Secondary | ICD-10-CM | POA: Insufficient documentation

## 2023-01-31 DIAGNOSIS — R7989 Other specified abnormal findings of blood chemistry: Secondary | ICD-10-CM | POA: Insufficient documentation

## 2023-01-31 DIAGNOSIS — M549 Dorsalgia, unspecified: Secondary | ICD-10-CM | POA: Diagnosis not present

## 2023-01-31 DIAGNOSIS — D473 Essential (hemorrhagic) thrombocythemia: Secondary | ICD-10-CM

## 2023-01-31 DIAGNOSIS — D75839 Thrombocytosis, unspecified: Secondary | ICD-10-CM | POA: Insufficient documentation

## 2023-01-31 DIAGNOSIS — G629 Polyneuropathy, unspecified: Secondary | ICD-10-CM | POA: Insufficient documentation

## 2023-01-31 DIAGNOSIS — Z79899 Other long term (current) drug therapy: Secondary | ICD-10-CM | POA: Insufficient documentation

## 2023-01-31 DIAGNOSIS — Z87891 Personal history of nicotine dependence: Secondary | ICD-10-CM | POA: Insufficient documentation

## 2023-01-31 DIAGNOSIS — Z7982 Long term (current) use of aspirin: Secondary | ICD-10-CM | POA: Insufficient documentation

## 2023-01-31 DIAGNOSIS — R161 Splenomegaly, not elsewhere classified: Secondary | ICD-10-CM | POA: Diagnosis not present

## 2023-01-31 LAB — CBC WITH DIFFERENTIAL (CANCER CENTER ONLY)
Abs Immature Granulocytes: 0.03 10*3/uL (ref 0.00–0.07)
Basophils Absolute: 0 10*3/uL (ref 0.0–0.1)
Basophils Relative: 1 %
Eosinophils Absolute: 0.1 10*3/uL (ref 0.0–0.5)
Eosinophils Relative: 2 %
HCT: 42.8 % (ref 39.0–52.0)
Hemoglobin: 15.4 g/dL (ref 13.0–17.0)
Immature Granulocytes: 1 %
Lymphocytes Relative: 14 %
Lymphs Abs: 0.8 10*3/uL (ref 0.7–4.0)
MCH: 38.4 pg — ABNORMAL HIGH (ref 26.0–34.0)
MCHC: 36 g/dL (ref 30.0–36.0)
MCV: 106.7 fL — ABNORMAL HIGH (ref 80.0–100.0)
Monocytes Absolute: 0.8 10*3/uL (ref 0.1–1.0)
Monocytes Relative: 15 %
Neutro Abs: 3.8 10*3/uL (ref 1.7–7.7)
Neutrophils Relative %: 67 %
Platelet Count: 275 10*3/uL (ref 150–400)
RBC: 4.01 MIL/uL — ABNORMAL LOW (ref 4.22–5.81)
RDW: 12.9 % (ref 11.5–15.5)
WBC Count: 5.5 10*3/uL (ref 4.0–10.5)
nRBC: 0 % (ref 0.0–0.2)

## 2023-01-31 LAB — CMP (CANCER CENTER ONLY)
ALT: 20 U/L (ref 0–44)
AST: 31 U/L (ref 15–41)
Albumin: 4.6 g/dL (ref 3.5–5.0)
Alkaline Phosphatase: 53 U/L (ref 38–126)
Anion gap: 6 (ref 5–15)
BUN: 19 mg/dL (ref 8–23)
CO2: 31 mmol/L (ref 22–32)
Calcium: 9.5 mg/dL (ref 8.9–10.3)
Chloride: 104 mmol/L (ref 98–111)
Creatinine: 0.84 mg/dL (ref 0.61–1.24)
GFR, Estimated: >60 mL/min (ref >60)
Glucose, Bld: 87 mg/dL (ref 70–99)
Potassium: 4.5 mmol/L (ref 3.5–5.1)
Sodium: 141 mmol/L (ref 135–145)
Total Bilirubin: 1.1 mg/dL (ref 0.3–1.2)
Total Protein: 7.6 g/dL (ref 6.5–8.1)

## 2023-01-31 MED ORDER — HYDROXYUREA 500 MG PO CAPS: ORAL_CAPSULE | ORAL | 1 refills | Status: DC

## 2023-01-31 NOTE — Progress Notes (Signed)
HEMATOLOGY ONCOLOGY PROGRESS NOTE  Date of service:  01/31/2023  Patient Care Team: Corliss Blacker, MD as PCP - General (Internal Medicine)  CC Follow-up for continued evaluation and management of essential thrombocythemia.  Diagnosis:  JAK2 Essential thrombocytosis   Current Treatment: Hydroxyurea 1000 mg daily  INTERVAL HISTORY:  [name] is a 77 y.o. male here for continued evaluation and management of his JAK2 positive essential thrombocytosis.   Patient was last seen by me on 10/04/2022 and he was doing well overall.  Patient reports he has been doing well overall without any new medical concerns. 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   He is regularly taking Hydroxyurea as prescribed and has been tolerating it well without any toxicities.   Patient is taking Oxycodone 1-325 mg and Vitamin-D 2,000 units.   He denies any new infections, fevers, chills, night sweats, bone pain, abdominal pain, chest pain, or leg swelling.   Patient has received the influenza vaccine, COVID-19 Booster, RSV vaccine, and pneumonia vaccines.    REVIEW OF SYSTEMS:   10 Point review of Systems was done is negative except as noted above.   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    Hypertension     Past Surgical History:  Procedure Laterality Date   CARDIAC CATHETERIZATION     16 years ago negative    HERNIA REPAIR     right inguinal hernia surgery    I & D KNEE WITH POLY EXCHANGE  12/06/2011   Procedure: IRRIGATION AND DEBRIDEMENT KNEE WITH POLY EXCHANGE;  Surgeon: Mauri Pole, MD;  Location: [facility];  Service: Orthopedics;  Laterality: Right;  Right Total Knee Irrigation and Debridement with Poly Exchange   JOINT REPLACEMENT     left knee 5/12, right TKA 10/12     Social  History   Tobacco Use   Smoking status: Former    Packs/day: .25    Types: Cigarettes    Quit date: 11/16/1963    Years since quitting: 59.2   Smokeless tobacco: Never  Vaping Use   Vaping Use: Never used  Substance Use Topics   Alcohol use: Yes    Alcohol/week: 2.0 standard drinks of alcohol    Types: 2 Cans of beer per week   Drug use: No    ALLERGIES:  is allergic to niacin, niacin and related, duloxetine hcl, and simvastatin.  MEDICATIONS:  Current Outpatient Medications  Medication Sig Dispense Refill   acetaminophen (TYLENOL) 500 MG tablet Take 1,000 mg by mouth every 4 (four) hours as needed for moderate pain.     aspirin 325 MG tablet Take 325 mg by mouth daily.      atorvastatin (LIPITOR) 40 MG tablet Take 40 mg by mouth every morning.     baclofen (LIORESAL) 10 MG tablet Take 10 mg by mouth 2 (two) times daily.  0   Cholecalciferol (VITAMIN D-3) 1000 UNITS CAPS Take 1,000 Units by mouth daily.     clotrimazole-betamethasone (LOTRISONE) cream Apply 1 Application topically 2 (two) times daily as needed (itching).     gabapentin (NEURONTIN) 300 MG capsule Take 300 mg by mouth 3 (three) times daily.     gabapentin (NEURONTIN) 300 MG capsule Take 900 mg by mouth 3 (three) times daily.     hydrochlorothiazide (HYDRODIURIL) 12.5  MG tablet Take 12.5 mg by mouth every morning.     hydroxyurea (HYDREA) 500 MG capsule Take 2 capsules (1,000 mg total) by mouth daily on Monday/Tuesday/Wednesday/Thursday/Friday and 1 cap (500mg ) po daily on Saturday and sunday. May take with food to minimize GI side effects. 180 capsule 1   Multiple Vitamin (MULITIVITAMIN WITH MINERALS) TABS Take 2 tablets by mouth daily.      Omega-3 Fatty Acids (SEA-OMEGA 30 PO) Take 1 capsule by mouth daily.     oxyCODONE-acetaminophen (PERCOCET) 7.5-325 MG tablet Take 1 tablet by mouth every 6 (six) hours.     No current facility-administered medications for this visit.    PHYSICAL EXAMINATION: ECOG PERFORMANCE  STATUS: 1 - Symptomatic but completely ambulatory  Vitals:   01/31/23 0858  BP: 128/75  Pulse: 89  Resp: 18  Temp: 97.7 F (36.5 C)  SpO2: 100%    Filed Weights   01/31/23 0858  Weight: 207 lb 14.4 oz (94.3 kg)    .Body mass index is 28.2 kg/m.  NAD GENERAL:alert, in no acute distress and comfortable SKIN: no acute rashes, no significant lesions EYES: conjunctiva are pink and non-injected, sclera anicteric NECK: supple, no JVD LYMPH:  no palpable lymphadenopathy in the cervical, axillary or inguinal regions LUNGS: clear to auscultation b/l with normal respiratory effort HEART: regular rate & rhythm ABDOMEN:  normoactive bowel sounds , non tender, not distended. Extremity: no pedal edema PSYCH: alert & oriented x 3 with fluent speech NEURO: no focal motor/sensory deficits  Exam performed in chair.  LABORATORY DATA:   I have reviewed the data as listed  .    Latest Ref Rng & Units 01/31/2023    7:23 AM 10/04/2022    8:04 AM 06/07/2022    7:37 AM  CBC  WBC 4.0 - 10.5 K/uL 5.5  5.1  4.4   Hemoglobin 13.0 - 17.0 g/dL 15.4  15.0  14.8   Hematocrit 39.0 - 52.0 % 42.8  42.7  41.8   Platelets 150 - 400 K/uL 275  256  263       Latest Ref Rng & Units 01/31/2023    7:23 AM 10/04/2022    8:04 AM 06/07/2022    7:37 AM  CMP  Glucose 70 - 99 mg/dL 87  100  82   BUN 8 - 23 mg/dL 19  17  18    Creatinine 0.61 - 1.24 mg/dL 0.84  0.82  0.87   Sodium 135 - 145 mmol/L 141  139  140   Potassium 3.5 - 5.1 mmol/L 4.5  4.8  3.9   Chloride 98 - 111 mmol/L 104  104  104   CO2 22 - 32 mmol/L 31  31  30    Calcium 8.9 - 10.3 mg/dL 9.5  9.5  9.3   Total Protein 6.5 - 8.1 g/dL 7.6  7.8  7.3   Total Bilirubin 0.3 - 1.2 mg/dL 1.1  0.6  0.8   Alkaline Phos 38 - 126 U/L 53  63  61   AST 15 - 41 U/L 31  25  24    ALT 0 - 44 U/L 20  16  18        RADIOGRAPHIC STUDIES:  I have personally reviewed the radiological images as listed and agreed with the findings in the report.  [place] abd  03/15/2017: IMPRESSION: 1. Liver is echogenic consistent with fatty infiltration and/or hepatocellular disease. No focal hepatic abnormality. No gallstones or biliary distention.   2. Spleen is enlarged at  14.4 cm with a volume of 620 cc.    Electronically Signed   By: Marcello Moores  Register   On: 03/15/2017 09:15   ASSESSMENT & PLAN: '  77 y.o.  Caucasian male with   #1  JAK2 positive essential thrombocytosis #2 Abnormal LFTs - AST and ALT have normalized on labs today.  Likely from fatty liver. -will continue to monitor.  PLAN: -Discussed lab results from today, 01/31/2023, with the patient. CBC and CMP are stable.  Patients platelets are at goal. -No toxicities from his current use of hydroxyurea and shall continue this. -Continue aspirin 81 mg p.o. daily. -No indication for complications from his essential thrombocytosis at this time.  FOLLOW UP: RTC with Dr Irene Limbo with labs in 4 months (patient prefers Monday mornings 1st appointment)  The total time spent in the appointment was 20 minutes* .  All of the patient's questions were answered with apparent satisfaction. The patient knows to call the clinic with any problems, questions or concerns.   Sullivan Lone MD MS AAHIVMS [facility] Hematology/Oncology Physician [facility]  .*Total Encounter Time as defined by the Centers for Medicare and Medicaid Services includes, in addition to the face-to-face time of a patient visit (documented in the note above) non-face-to-face time: obtaining and reviewing outside history, ordering and reviewing medications, tests or procedures, care coordination (communications with other health care professionals or caregivers) and documentation in the medical record.  .I have reviewed the above documentation for accuracy and completeness, and I agree with the above. Brunetta Genera MD  I, Cleda Mccreedy, am acting as a scribe for Sullivan Lone, MD.

## 2023-02-01 DIAGNOSIS — M47817 Spondylosis without myelopathy or radiculopathy, lumbosacral region: Secondary | ICD-10-CM | POA: Diagnosis not present

## 2023-02-01 DIAGNOSIS — M62830 Muscle spasm of back: Secondary | ICD-10-CM | POA: Diagnosis not present

## 2023-02-01 DIAGNOSIS — G894 Chronic pain syndrome: Secondary | ICD-10-CM | POA: Diagnosis not present

## 2023-02-01 DIAGNOSIS — M150 Primary generalized (osteo)arthritis: Secondary | ICD-10-CM | POA: Diagnosis not present

## 2023-02-08 ENCOUNTER — Other Ambulatory Visit: Payer: Self-pay | Admitting: Hematology

## 2023-02-08 DIAGNOSIS — D473 Essential (hemorrhagic) thrombocythemia: Secondary | ICD-10-CM

## 2023-02-24 ENCOUNTER — Other Ambulatory Visit: Payer: Self-pay | Admitting: Hematology

## 2023-02-24 DIAGNOSIS — D473 Essential (hemorrhagic) thrombocythemia: Secondary | ICD-10-CM

## 2023-03-21 DIAGNOSIS — M48062 Spinal stenosis, lumbar region with neurogenic claudication: Secondary | ICD-10-CM | POA: Diagnosis not present

## 2023-03-21 DIAGNOSIS — D473 Essential (hemorrhagic) thrombocythemia: Secondary | ICD-10-CM | POA: Diagnosis not present

## 2023-03-21 DIAGNOSIS — I10 Essential (primary) hypertension: Secondary | ICD-10-CM | POA: Diagnosis not present

## 2023-03-21 DIAGNOSIS — E7800 Pure hypercholesterolemia, unspecified: Secondary | ICD-10-CM | POA: Diagnosis not present

## 2023-03-21 DIAGNOSIS — I499 Cardiac arrhythmia, unspecified: Secondary | ICD-10-CM | POA: Diagnosis not present

## 2023-03-29 DIAGNOSIS — G894 Chronic pain syndrome: Secondary | ICD-10-CM | POA: Diagnosis not present

## 2023-03-29 DIAGNOSIS — M150 Primary generalized (osteo)arthritis: Secondary | ICD-10-CM | POA: Diagnosis not present

## 2023-03-29 DIAGNOSIS — M62830 Muscle spasm of back: Secondary | ICD-10-CM | POA: Diagnosis not present

## 2023-03-29 DIAGNOSIS — M47817 Spondylosis without myelopathy or radiculopathy, lumbosacral region: Secondary | ICD-10-CM | POA: Diagnosis not present

## 2023-05-09 ENCOUNTER — Telehealth: Payer: Self-pay | Admitting: Hematology

## 2023-05-24 DIAGNOSIS — G894 Chronic pain syndrome: Secondary | ICD-10-CM | POA: Diagnosis not present

## 2023-05-24 DIAGNOSIS — M47817 Spondylosis without myelopathy or radiculopathy, lumbosacral region: Secondary | ICD-10-CM | POA: Diagnosis not present

## 2023-05-24 DIAGNOSIS — Z79891 Long term (current) use of opiate analgesic: Secondary | ICD-10-CM | POA: Diagnosis not present

## 2023-05-24 DIAGNOSIS — M62830 Muscle spasm of back: Secondary | ICD-10-CM | POA: Diagnosis not present

## 2023-05-24 DIAGNOSIS — M150 Primary generalized (osteo)arthritis: Secondary | ICD-10-CM | POA: Diagnosis not present

## 2023-06-01 ENCOUNTER — Other Ambulatory Visit: Payer: Self-pay

## 2023-06-01 DIAGNOSIS — D473 Essential (hemorrhagic) thrombocythemia: Secondary | ICD-10-CM

## 2023-06-02 ENCOUNTER — Inpatient Hospital Stay: Payer: Medicare Other | Admitting: Hematology

## 2023-06-02 ENCOUNTER — Inpatient Hospital Stay: Payer: Medicare Other | Attending: Hematology

## 2023-06-02 ENCOUNTER — Telehealth: Payer: Self-pay | Admitting: Hematology

## 2023-06-02 VITALS — BP 141/88 | HR 64 | Temp 97.9°F | Resp 18 | Wt 193.8 lb

## 2023-06-02 DIAGNOSIS — Z888 Allergy status to other drugs, medicaments and biological substances status: Secondary | ICD-10-CM | POA: Insufficient documentation

## 2023-06-02 DIAGNOSIS — G629 Polyneuropathy, unspecified: Secondary | ICD-10-CM | POA: Diagnosis not present

## 2023-06-02 DIAGNOSIS — M549 Dorsalgia, unspecified: Secondary | ICD-10-CM | POA: Insufficient documentation

## 2023-06-02 DIAGNOSIS — Z79899 Other long term (current) drug therapy: Secondary | ICD-10-CM | POA: Diagnosis not present

## 2023-06-02 DIAGNOSIS — D473 Essential (hemorrhagic) thrombocythemia: Secondary | ICD-10-CM | POA: Diagnosis not present

## 2023-06-02 DIAGNOSIS — I10 Essential (primary) hypertension: Secondary | ICD-10-CM | POA: Diagnosis not present

## 2023-06-02 DIAGNOSIS — Z7982 Long term (current) use of aspirin: Secondary | ICD-10-CM | POA: Diagnosis not present

## 2023-06-02 DIAGNOSIS — Z87891 Personal history of nicotine dependence: Secondary | ICD-10-CM | POA: Insufficient documentation

## 2023-06-02 DIAGNOSIS — Z7289 Other problems related to lifestyle: Secondary | ICD-10-CM | POA: Diagnosis not present

## 2023-06-02 DIAGNOSIS — R7989 Other specified abnormal findings of blood chemistry: Secondary | ICD-10-CM | POA: Insufficient documentation

## 2023-06-02 DIAGNOSIS — G8929 Other chronic pain: Secondary | ICD-10-CM | POA: Diagnosis not present

## 2023-06-02 DIAGNOSIS — M1990 Unspecified osteoarthritis, unspecified site: Secondary | ICD-10-CM | POA: Insufficient documentation

## 2023-06-02 LAB — CBC WITH DIFFERENTIAL (CANCER CENTER ONLY)
Abs Immature Granulocytes: 0.02 10*3/uL (ref 0.00–0.07)
Basophils Absolute: 0 10*3/uL (ref 0.0–0.1)
Basophils Relative: 0 %
Eosinophils Absolute: 0.1 10*3/uL (ref 0.0–0.5)
Eosinophils Relative: 3 %
HCT: 42.6 % (ref 39.0–52.0)
Hemoglobin: 15.2 g/dL (ref 13.0–17.0)
Immature Granulocytes: 0 %
Lymphocytes Relative: 18 %
Lymphs Abs: 0.8 10*3/uL (ref 0.7–4.0)
MCH: 38.7 pg — ABNORMAL HIGH (ref 26.0–34.0)
MCHC: 35.7 g/dL (ref 30.0–36.0)
MCV: 108.4 fL — ABNORMAL HIGH (ref 80.0–100.0)
Monocytes Absolute: 0.6 10*3/uL (ref 0.1–1.0)
Monocytes Relative: 14 %
Neutro Abs: 2.9 10*3/uL (ref 1.7–7.7)
Neutrophils Relative %: 65 %
Platelet Count: 252 10*3/uL (ref 150–400)
RBC: 3.93 MIL/uL — ABNORMAL LOW (ref 4.22–5.81)
RDW: 13 % (ref 11.5–15.5)
WBC Count: 4.5 10*3/uL (ref 4.0–10.5)
nRBC: 0 % (ref 0.0–0.2)

## 2023-06-02 LAB — CMP (CANCER CENTER ONLY)
ALT: 19 U/L (ref 0–44)
AST: 28 U/L (ref 15–41)
Albumin: 4.5 g/dL (ref 3.5–5.0)
Alkaline Phosphatase: 60 U/L (ref 38–126)
Anion gap: 6 (ref 5–15)
BUN: 19 mg/dL (ref 8–23)
CO2: 31 mmol/L (ref 22–32)
Calcium: 9.5 mg/dL (ref 8.9–10.3)
Chloride: 103 mmol/L (ref 98–111)
Creatinine: 0.88 mg/dL (ref 0.61–1.24)
GFR, Estimated: >60 mL/min (ref >60)
Glucose, Bld: 86 mg/dL (ref 70–99)
Potassium: 4.3 mmol/L (ref 3.5–5.1)
Sodium: 140 mmol/L (ref 135–145)
Total Bilirubin: 0.9 mg/dL (ref 0.3–1.2)
Total Protein: 7.4 g/dL (ref 6.5–8.1)

## 2023-06-02 MED ORDER — HYDROXYUREA 500 MG PO CAPS: ORAL_CAPSULE | ORAL | 2 refills | Status: DC

## 2023-06-02 NOTE — Telephone Encounter (Signed)
Appointment need to be rescheduled; patient is aware of rescheduled appointment times/dates

## 2023-06-02 NOTE — Progress Notes (Signed)
HEMATOLOGY ONCOLOGY PROGRESS NOTE  Date of service:  06/02/2023  Patient Care Team: Joya Martyr, MD as PCP - General (Internal Medicine)  CC Follow-up for continued evaluation and management of essential thrombocythemia.  Diagnosis:  JAK2 Essential thrombocytosis   Current Treatment: Hydroxyurea 1000 mg daily  INTERVAL HISTORY:  Jordan Vazquez is a 76 y.o. male here for continued evaluation and management of his JAK2 positive essential thrombocytosis.   Patient was last seen by me on 01/31/2023 and complained of back pain and neuropathy.  Today, he complains of chronic back pain with neck stiffness related to arthritis. He continues to take arthritis medication compliantly.   Patient reports that his Oxycodone dose was lowered to 10 MG from 25 MG. He reports that his Hydroxyurea dose was cut to two tablets 4 days a week, and 1 tablet on Friday, Saturday, and Sunday. Patient has been tolerating Hydroxyurea well with no toxicity issues. He denies any stomach issues or bleeding issues.  Patient reports amputation of his toes and he was seen by vascular surgery for management.  He denies any abdominal pain, leg swelling, or infection issues.  Patient had an EKG with PCP and was found to have skipped heart beats, which has been present since the 90s. He will be seen by cardiology in two weeks for further evaluation. He denies any obvious chest pain or palpitation at this time. Patient reports one syncope episode several years ago. He notes that his hydrochlorothiazide dose was lowered from 40 MG to 12.5 MG.  He reports that in the early 90s he was started on 325 MG Aspirin. He denies any history of stroke or MI.  He reports that he has a new PCP, Jordan. Sneha Raju.  REVIEW OF SYSTEMS:    10 Point review of Systems was done is negative except as noted above.   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    Hypertension     Past Surgical History:  Procedure Laterality Date   CARDIAC CATHETERIZATION     16  years ago negative    HERNIA REPAIR     right inguinal hernia surgery    I & D KNEE WITH POLY EXCHANGE  12/06/2011   Procedure: IRRIGATION AND DEBRIDEMENT KNEE WITH POLY EXCHANGE;  Surgeon: Shelda Pal, MD;  Location: [facility];  Service: Orthopedics;  Laterality: Right;  Right Total Knee Irrigation and Debridement with Poly Exchange   JOINT REPLACEMENT     left knee 5/12, right TKA 10/12     Social History   Tobacco Use   Smoking status: Former    Current packs/day: 0.00    Types: Cigarettes    Quit date: 11/16/1963    Years since quitting: 59.5   Smokeless tobacco: Never  Vaping Use   Vaping status: Never Used  Substance Use Topics   Alcohol use: Yes    Alcohol/week: 2.0 standard drinks of alcohol    Types: 2 Cans of beer per week   Drug use: No    ALLERGIES:  is allergic to niacin, niacin and related, duloxetine hcl, and simvastatin.  MEDICATIONS:  Current Outpatient Medications  Medication Sig Dispense Refill   acetaminophen (TYLENOL) 500 MG tablet Take 1,000 mg by mouth every 4 (four) hours as needed for moderate pain.     aspirin 325 MG tablet Take  325 mg by mouth daily.      atorvastatin (LIPITOR) 40 MG tablet Take 40 mg by mouth every morning.     baclofen (LIORESAL) 10 MG tablet Take 10 mg by mouth 2 (two) times daily.  0   Cholecalciferol (VITAMIN D-3) 1000 UNITS CAPS Take 2,000 Units by mouth daily.     clotrimazole-betamethasone (LOTRISONE) cream Apply 1 Application topically 2 (two) times daily as needed (itching).     gabapentin (NEURONTIN) 300 MG capsule Take 300 mg by mouth 3 (three) times daily.     gabapentin (NEURONTIN) 300 MG capsule Take 900 mg by mouth 3 (three) times daily.     hydrochlorothiazide (HYDRODIURIL) 12.5 MG tablet Take 12.5 mg by mouth every  morning.     hydroxyurea (HYDREA) 500 MG capsule Take 2 capsules (1,000 mg total) by mouth daily on Monday/Tuesday/Wednesday/Thursday/Friday and 1 cap (500mg ) po daily on Saturday and sunday. May take with food to minimize GI side effects. 180 capsule 1   Multiple Vitamin (MULITIVITAMIN WITH MINERALS) TABS Take 2 tablets by mouth daily.      Omega-3 Fatty Acids (SEA-OMEGA 30 PO) Take 1 capsule by mouth daily.     oxyCODONE-acetaminophen (PERCOCET) 7.5-325 MG tablet Take 1 tablet by mouth every 6 (six) hours.     No current facility-administered medications for this visit.    PHYSICAL EXAMINATION: ECOG PERFORMANCE STATUS: 1 - Symptomatic but completely ambulatory  There were no vitals filed for this visit.   There were no vitals filed for this visit.   .There is no height or weight on file to calculate BMI.    GENERAL:alert, in no acute distress and comfortable SKIN: no acute rashes, no significant lesions EYES: conjunctiva are pink and non-injected, sclera anicteric OROPHARYNX: MMM, no exudates, no oropharyngeal erythema or ulceration NECK: supple, no JVD LYMPH:  no palpable lymphadenopathy in the cervical, axillary or inguinal regions LUNGS: clear to auscultation b/l with normal respiratory effort HEART: regular rate & rhythm ABDOMEN:  normoactive bowel sounds , non tender, not distended. Extremity: no pedal edema PSYCH: alert & oriented x 3 with fluent speech NEURO: no focal motor/sensory deficits   LABORATORY DATA:   I have reviewed the data as listed  .    Latest Ref Rng & Units 06/02/2023    7:37 AM 01/31/2023    7:23 AM 10/04/2022    8:04 AM  CBC  WBC 4.0 - 10.5 K/uL 4.5  5.5  5.1   Hemoglobin 13.0 - 17.0 g/dL 44.0  10.2  72.5   Hematocrit 39.0 - 52.0 % 42.6  42.8  42.7   Platelets 150 - 400 K/uL 252  275  256       Latest Ref Rng & Units 01/31/2023    7:23 AM 10/04/2022    8:04 AM 06/07/2022    7:37 AM  CMP  Glucose 70 - 99 mg/dL 87  366  82   BUN 8 - 23  mg/dL 19  17  18    Creatinine 0.61 - 1.24 mg/dL 4.40  3.47  4.25   Sodium 135 - 145 mmol/L 141  139  140   Potassium 3.5 - 5.1 mmol/L 4.5  4.8  3.9   Chloride 98 - 111 mmol/L 104  104  104   CO2 22 - 32 mmol/L 31  31  30    Calcium 8.9 - 10.3 mg/dL 9.5  9.5  9.3   Total Protein 6.5 - 8.1 g/dL 7.6  7.8  7.3   Total  Bilirubin 0.3 - 1.2 mg/dL 1.1  0.6  0.8   Alkaline Phos 38 - 126 U/L 53  63  61   AST 15 - 41 U/L 31  25  24    ALT 0 - 44 U/L 20  16  18        RADIOGRAPHIC STUDIES:  I have personally reviewed the radiological images as listed and agreed with the findings in the report.  [place] abd 03/15/2017: IMPRESSION: 1. Liver is echogenic consistent with fatty infiltration and/or hepatocellular disease. No focal hepatic abnormality. No gallstones or biliary distention.   2. Spleen is enlarged at 14.4 cm with a volume of 620 cc.    Electronically Signed   By: Maisie Fus  Register   On: 03/15/2017 09:15   ASSESSMENT & PLAN: '  77 y.o.  Caucasian male with   #1  JAK2 positive essential thrombocytosis #2 Abnormal LFTs - AST and ALT have normalized on labs today.  Likely from fatty liver. -will continue to monitor.  PLAN:  -Discussed lab results on 06/02/2023 in detail with patient. CBC normal, showed WBC of 4.5K, hemoglobin of 15.2, and platelets of 252K -CMP normal, liver/kidney function stable -continue Hydroxyurea dose which was recently lowered to two tablets 4 days a week, and 1 tablet on Friday, Saturday, and Sunday -From an essential thrombocytosis standpoint, patient's platelets have been stable and 81 MG Aspirin would be adequate for management. However, given arterial disease with amputation, there may be a stronger indication to continue 325 MG dose of Aspirin. There may also be a role to continue 325 MG  Aspirin from a cardiovascular perspective. Would recommend patient to continue 325 MG at this time unless there is concern for side effects. -recommend coated version of  Aspirin to reduce risk of stomach ulcers -No toxicities from his current use of hydroxyurea and shall continue this. -No indication for complications from his essential thrombocytosis at this time. -answered all of patient's question's in detail regarding medication intake -will continue to monitor with labs in 4 months  FOLLOW UP: RTC with Jordan Vazquez with labs in 4 months (patient prefers Monday mornings 1st appointment)  The total time spent in the appointment was 20 minutes* .  All of the patient's questions were answered with apparent satisfaction. The patient knows to call the clinic with any problems, questions or concerns.   Wyvonnia Lora MD MS AAHIVMS [facility] Hematology/Oncology Physician [facility]  .*Total Encounter Time as defined by the Centers for Medicare and Medicaid Services includes, in addition to the face-to-face time of a patient visit (documented in the note above) non-face-to-face time: obtaining and reviewing outside history, ordering and reviewing medications, tests or procedures, care coordination (communications with other health care professionals or caregivers) and documentation in the medical record.    I,Jordan Vazquez,acting as a Neurosurgeon for Wyvonnia Lora, MD.,have documented all relevant documentation on the behalf of Wyvonnia Lora, MD,as directed by  Wyvonnia Lora, MD while in the presence of Wyvonnia Lora, MD.  .I have reviewed the above documentation for accuracy and completeness, and I agree with the above. Jordan Maine MD

## 2023-06-06 ENCOUNTER — Other Ambulatory Visit: Payer: Medicare Other

## 2023-06-06 ENCOUNTER — Ambulatory Visit: Payer: Medicare Other | Admitting: Hematology

## 2023-06-10 NOTE — Progress Notes (Unsigned)
Cardiology Office Note:    Date:  06/14/2023   ID:  Jordan Vazquez, DOB [birth year], MRN [id number]  PCP:  Joya Martyr, MD   [facility] Providers Cardiologist:  None     Referring MD: Thana Ates, MD   Chief Complaint  Patient presents with   Atrial Fibrillation    History of Present Illness:    Jordan Vazquez is a 77 y.o. male seen at the request  of Dr Margaretann Loveless for evaluation of  atrial fibrillation. He has a history of HTN. He was admitted in July 2023 with acute metabolic encephalopathy in setting of UTI and dehydration. Ecg at that time showed Afib with controlled rate. This was not mentioned in notes at that time.   The patient reports he has had an arrhythmia for many years. Last documented Ecg in 2012 showed NSR. He states he saw me remotely in the 1990s and had a cardiac cath in 1999 that was OK. He denies any chest pain, dyspnea, palpitations. No dizziness. Had prior amputation of some toes on right foot for an orthopedic issue (not vascular).   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    Hyperlipidemia    Hypertension    Peripheral neuropathy     Past Surgical History:  Procedure Laterality Date   CARDIAC CATHETERIZATION     16 years ago negative    HERNIA REPAIR     right inguinal hernia surgery    I & D KNEE WITH POLY EXCHANGE  12/06/2011   Procedure: IRRIGATION AND DEBRIDEMENT KNEE WITH POLY EXCHANGE;  Surgeon: Shelda Pal, MD;  Location: [facility];  Service: Orthopedics;  Laterality: Right;  Right Total Knee Irrigation and Debridement with Poly Exchange   JOINT REPLACEMENT     left knee 5/12, right TKA 10/12    TOE AMPUTATION Right     Current Medications: Current Meds  Medication Sig   acetaminophen (TYLENOL) 500 MG tablet Take 1,000 mg by mouth every 4 (four) hours as  needed for moderate pain.   apixaban (ELIQUIS) 5 MG TABS tablet Take 1 tablet (5 mg total) by mouth 2 (two) times daily.   atorvastatin (LIPITOR) 40 MG tablet Take 40 mg by mouth every morning.   baclofen (LIORESAL) 10 MG tablet Take 10 mg by mouth 2 (two) times daily.   Cholecalciferol (VITAMIN D-3) 1000 UNITS CAPS Take 2,000 Units by mouth daily.   clotrimazole-betamethasone (LOTRISONE) cream Apply 1 Application topically 2 (two) times daily as needed (itching).   gabapentin (NEURONTIN) 300 MG capsule Take 900 mg by mouth 3 (three) times daily.   hydrochlorothiazide (HYDRODIURIL) 12.5 MG tablet Take 12.5 mg by mouth every morning.   HYDROcodone-acetaminophen (NORCO) 10-325 MG tablet Take 1 tablet by mouth 4 (four) times daily as needed.   hydroxyurea (HYDREA) 500 MG capsule Take 2 capsules (1,000 mg total) by mouth daily on Monday/Tuesday/Wednesday/Thursday and 1 cap (500mg ) po daily on Friday,Saturday and sunday. May take with food to minimize GI side effects.   Multiple Vitamin (MULITIVITAMIN WITH MINERALS) TABS Take 2 tablets by mouth daily.    Omega-3 Fatty Acids (SEA-OMEGA 30 PO) Take 1 capsule by mouth daily.   oxyCODONE-acetaminophen (PERCOCET) 10-325 MG tablet Take 1 tablet by mouth every 4 (four) hours as needed.   oxyCODONE-acetaminophen (PERCOCET) 7.5-325 MG tablet Take  1 tablet by mouth every 6 (six) hours.   [DISCONTINUED] aspirin 325 MG tablet Take 325 mg by mouth daily.      Allergies:   Niacin, Niacin and related, Duloxetine hcl, and Simvastatin   Social History   Socioeconomic History   Marital status: Married    Spouse name: Not on file   Number of children: 3   Years of education: Not on file   Highest education level: Not on file  Occupational History   Not on file  Tobacco Use   Smoking status: Former    Current packs/day: 0.00    Types: Cigarettes    Quit date: 11/16/1963    Years since quitting: 59.6   Smokeless tobacco: Never  Vaping Use   Vaping status:  Never Used  Substance and Sexual Activity   Alcohol use: Yes    Alcohol/week: 2.0 standard drinks of alcohol    Types: 2 Cans of beer per week   Drug use: No   Sexual activity: Not on file  Other Topics Concern   Not on file  Social History Narrative   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    Social Determinants of Health   Financial Resource Strain: Not on file  Food Insecurity: Not on file  Transportation Needs: Not on file  Physical Activity: Not on file  Stress: Not on file  Social Connections: Not on file     Family History: The patient's family history includes Hypertension in his mother.  ROS:   Please see the history of present illness.     All other systems reviewed and are negative.  EKGs/Labs/Other Studies Reviewed:    The following studies were reviewed today: EKG Interpretation Date/Time:  Tuesday June 14 2023 08:03:48 EDT Ventricular Rate:  74 PR Interval:    QRS Duration:  110 QT Interval:  380 QTC Calculation: 421 R Axis:   44  Text Interpretation: Atrial fibrillation When compared with ECG of 17-May-2022 12:26, No significant change since last tracing Confirmed by [place], Jhayden Demuro [id number]) on 06/14/2023 8:14:56 AM   EKG Interpretation Date/Time:  Tuesday June 14 2023 08:03:48 EDT Ventricular Rate:  74 PR Interval:    QRS Duration:  110 QT Interval:  380 QTC Calculation: 421 R Axis:   44  Text Interpretation: Atrial fibrillation When compared with ECG of 17-May-2022 12:26, No significant change since last tracing Confirmed by [place], Aarya Quebedeaux [phone]) on 06/14/2023 8:14:56 AM    Recent Labs: 06/02/2023: ALT 19; BUN 19; Creatinine 0.88; Hemoglobin 15.2; Platelet Count 252; Potassium 4.3; Sodium 140  Recent Lipid Panel No results found for: "CHOL", "TRIG", "HDL", "CHOLHDL", "VLDL", "LDLCALC", "LDLDIRECT" Dated 09/14/21: cholesterol 147, triglycerides 281, HDL 41, LDL 61.   Risk Assessment/Calculations:    CHA2DS2-VASc Score = 3   This indicates a 3.2%  annual risk of stroke. The patient's score is based upon: CHF History: 0 HTN History: 1 Diabetes History: 0 Stroke History: 0 Vascular Disease History: 0 Age Score: 2 Gender Score: 0           Physical Exam:    VS:  BP (!) 160/76   Pulse 74   Ht 5\' 10"  (1.778 m)   Wt 198 lb (89.8 kg)   SpO2 96%   BMI 28.41 kg/m     Wt Readings from Last 3 Encounters:  06/14/23 198 lb (89.8 kg)  06/02/23 193 lb 12.8 oz (87.9 kg)  01/31/23 207 lb 14.4 oz (94.3 kg)     GEN:  Well nourished, well  developed in no acute distress HEENT: Normal NECK: No JVD; No carotid bruits LYMPHATICS: No lymphadenopathy CARDIAC: IRRR, no murmurs, rubs, gallops RESPIRATORY:  Clear to auscultation without rales, wheezing or rhonchi  ABDOMEN: Soft, non-tender, non-distended MUSCULOSKELETAL:  No edema; No deformity  SKIN: Warm and dry NEUROLOGIC:  Alert and oriented x 3 PSYCHIATRIC:  Normal affect   ASSESSMENT:    1. Longstanding persistent atrial fibrillation (HCC)   2. Screening for heart disease   3. Primary hypertension    PLAN:    In order of problems listed above:  Atrial fibrillation chronic by history and likely permanent. Rate is good and he is asymptomatic. Will complete work up with TSH and Echo. With [place] Vasc score of 3 recommend anticoagulation with Eliquis 5 mg bid. Will discontinue ASA.  HTN. BP poorly controlled today. On low dose HCT. I asked him to keep a diary of BP at home so we can review. May need additional therapy.  HLD on lipitor and fish oil. Will update lipid panel.  Essential thrombocytosis. Controlled with hydroxyurea            Medication Adjustments/Labs and Tests Ordered: Current medicines are reviewed at length with the patient today.  Concerns regarding medicines are outlined above.  Orders Placed This Encounter  Procedures   TSH   Lipid panel   EKG 12-Lead   ECHOCARDIOGRAM COMPLETE   Meds ordered this encounter  Medications   apixaban (ELIQUIS) 5 MG  TABS tablet    Sig: Take 1 tablet (5 mg total) by mouth 2 (two) times daily.    Dispense:  60 tablet    Refill:  11    There are no Patient Instructions on file for this visit.   Signed, Herma Uballe [place], MD  06/14/2023 8:40 AM    [facility]

## 2023-06-14 ENCOUNTER — Ambulatory Visit: Payer: Medicare Other | Attending: Cardiology | Admitting: Cardiology

## 2023-06-14 ENCOUNTER — Encounter: Payer: Self-pay | Admitting: Cardiology

## 2023-06-14 VITALS — BP 160/76 | HR 74 | Ht 70.0 in | Wt 198.0 lb

## 2023-06-14 DIAGNOSIS — Z136 Encounter for screening for cardiovascular disorders: Secondary | ICD-10-CM

## 2023-06-14 DIAGNOSIS — I4811 Longstanding persistent atrial fibrillation: Secondary | ICD-10-CM | POA: Diagnosis not present

## 2023-06-14 DIAGNOSIS — I10 Essential (primary) hypertension: Secondary | ICD-10-CM

## 2023-06-14 LAB — LIPID PANEL
Chol/HDL Ratio: 3.3 ratio (ref 0.0–5.0)
Cholesterol, Total: 140 mg/dL (ref 100–199)
HDL: 43 mg/dL (ref >39)
LDL Chol Calc (NIH): 59 mg/dL (ref 0–99)
Triglycerides: 235 mg/dL — ABNORMAL HIGH (ref 0–149)
VLDL Cholesterol Cal: 38 mg/dL (ref 5–40)

## 2023-06-14 LAB — TSH

## 2023-06-14 MED ORDER — APIXABAN 5 MG PO TABS: 5.0000 mg | ORAL_TABLET | Freq: Two times a day (BID) | ORAL | 0 refills | Status: DC

## 2023-06-14 MED ORDER — APIXABAN 5 MG PO TABS: 5.0000 mg | ORAL_TABLET | Freq: Two times a day (BID) | ORAL | 11 refills | Status: DC

## 2023-06-14 NOTE — Addendum Note (Signed)
Addended by: Neoma Laming on: 06/14/2023 09:16 AM   Modules accepted: Orders

## 2023-06-14 NOTE — Patient Instructions (Signed)
Medication Instructions:  Stop Aspirin Start Eliquis 5 mg twice a day Continue all other medications *If you need a refill on your cardiac medications before your next appointment, please call your pharmacy*   Lab Work: Tsh and Lipid panel today   Testing/Procedures: Schedule Echo   Follow-Up: At [facility], you and your health needs are our priority.  As part of our continuing mission to provide you with exceptional heart care, we have created designated Provider Care Teams.  These Care Teams include your primary Cardiologist (physician) and Advanced Practice Providers (APPs -  Physician Assistants and Nurse Practitioners) who all work together to provide you with the care you need, when you need it.  We recommend signing up for the patient portal called "MyChart".  Sign up information is provided on this After Visit Summary.  MyChart is used to connect with patients for Virtual Visits (Telemedicine).  Patients are able to view lab/test results, encounter notes, upcoming appointments, etc.  Non-urgent messages can be sent to your provider as well.   To learn more about what you can do with MyChart, go to [email or web address].    Your next appointment:    2 months    Provider:  Dr.Jordan's PA     Monitor blood pressure daily and keep a log  Bring readings to next appointment

## 2023-07-04 ENCOUNTER — Ambulatory Visit (HOSPITAL_COMMUNITY): Payer: Medicare Other | Attending: Cardiology

## 2023-07-04 DIAGNOSIS — Z136 Encounter for screening for cardiovascular disorders: Secondary | ICD-10-CM | POA: Diagnosis not present

## 2023-07-04 DIAGNOSIS — I4811 Longstanding persistent atrial fibrillation: Secondary | ICD-10-CM

## 2023-07-04 DIAGNOSIS — I10 Essential (primary) hypertension: Secondary | ICD-10-CM | POA: Diagnosis not present

## 2023-07-04 LAB — ECHOCARDIOGRAM COMPLETE
MV M vel: 5.66 m/s
MV Peak grad: 128.1 mmHg
P 1/2 time: 574 msec
S' Lateral: 2.5 cm

## 2023-07-12 ENCOUNTER — Telehealth: Payer: Self-pay | Admitting: *Deleted

## 2023-07-12 NOTE — Telephone Encounter (Signed)
I s/w the pt and he was agreeable to move in office up sooner than tele pre op appt. Appt has been moved to 07/25/23 with Edd Fabian, FNP @ 8:50. Pt thanked me for the help. I will update all parties involved.

## 2023-07-12 NOTE — Telephone Encounter (Signed)
Please advise holding Eliquis prior to colonoscopy.  Thank you!  DW  

## 2023-07-12 NOTE — Telephone Encounter (Signed)
Patient with diagnosis of afib on Eliquis for anticoagulation.    Procedure: colonoscopy Date of procedure: 07/27/23  CHA2DS2-VASc Score = 3  This indicates a 3.2% annual risk of stroke. The patient's score is based upon: CHF History: 0 HTN History: 1 Diabetes History: 0 Stroke History: 0 Vascular Disease History: 0 Age Score: 2 Gender Score: 0   CrCl 73mL/min Platelet count 252K  Per office protocol, patient can hold Eliquis for 1-2 days prior to procedure.    **This guidance is not considered finalized until pre-operative APP has relayed final recommendations.**

## 2023-07-12 NOTE — Telephone Encounter (Signed)
   Pre-operative Risk Assessment    Patient Name: Jordan Vazquez  DOB: [birth year] MRN: [id number]    DATE OF LAST VISIT: 06/14/23 DR. [place] DATE OF NEXT VISIT: 08/15/23 ANGELA DUKE; 2 MONTH F/U  Request for Surgical Clearance    Procedure:   COLONOSCOPY ; H/O POLYPS  Date of Surgery:  Clearance 07/27/23                                 Surgeon:  DR. Liliane Shi Surgeon's Group or Practice Name:  [facility] Phone number:  [phone] Fax number:  [phone]   Type of Clearance Requested:   - Medical  - Pharmacy:  Hold Apixaban (Eliquis)     Type of Anesthesia:  Not Indicated (PROPOFOL?)   Additional requests/questions:    Elpidio Anis   07/12/2023, 10:36 AM

## 2023-07-12 NOTE — Telephone Encounter (Signed)
   Name: Jordan Vazquez  DOB: 06/09/1946  MRN: [id number]  Primary Cardiologist: None   Preoperative team, please contact this patient and set up a phone call appointment for further preoperative risk assessment. Please obtain consent and complete medication review. Thank you for your help.  I confirm that guidance regarding antiplatelet and oral anticoagulation therapy has been completed and, if necessary, noted below.  Per pharm D, patient may hold Eliquis 1-2 days prior to procedure.    Carlos Levering, NP 07/12/2023, 3:02 PM [facility]

## 2023-07-20 NOTE — Progress Notes (Unsigned)
Cardiology Clinic Note   Patient Name: Jordan Vazquez Date of Encounter: 07/25/2023  Primary Care Provider:  Joya Martyr, MD Primary Cardiologist:  Peter Swaziland, MD  Patient Profile    Jordan Vazquez 77 year old male presents the clinic today for follow-up evaluation of his atrial fibrillation and preoperative cardiac evaluation.   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    Hyperlipidemia    Hypertension    Peripheral neuropathy    Past Surgical History:  Procedure Laterality Date   CARDIAC CATHETERIZATION     16 years ago negative    HERNIA REPAIR     right inguinal hernia surgery    I & D KNEE WITH POLY EXCHANGE  12/06/2011   Procedure: IRRIGATION AND DEBRIDEMENT KNEE WITH POLY EXCHANGE;  Surgeon: Shelda Pal, MD;  Location: [facility];  Service: Orthopedics;  Laterality: Right;  Right Total Knee Irrigation and Debridement with Poly Exchange   JOINT REPLACEMENT     left knee 5/12, right TKA 10/12    TOE AMPUTATION Right     Allergies  Allergies  Allergen Reactions   Niacin     Other reaction(s): burning all over feeling   Niacin And Related Itching   Duloxetine Hcl Rash   Simvastatin Rash    History of Present Illness    Jordan Vazquez has a PMH of hypertension, persistent atrial fibrillation and metabolic encephalopathy.  He was admitted 7/23 with acute metabolic encephalopathy in the setting of UTI and dehydration.  His EKG showed atrial fibrillation which was rate controlled.  Echocardiogram 07/04/2023 showed an LVEF of 60-65%, intermediate diastolic parameters, severely dilated left atria, moderate mitral valve regurgitation, moderate tricuspid valve regurgitation and mild aortic valve regurgitation.  He was seen in follow-up by Dr. Swaziland on 06/14/2023.  During  that time his cardiac catheterization from 1999 which was okay was discussed.  He denied chest pain, dyspnea, palpitations, and dizziness.  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  He was noted to have poorly controlled hypertension.  He was started on hydrochlorothiazide and instructed to keep a blood pressure log.  He presents to the clinic today for follow-up evaluation and states he continues to be fairly physically active at home mowing and weed eating.  His EKG today shows atrial fibrillation 56 bpm.  He is cardiac unaware.  We reviewed his upcoming colonoscopy.  He has been maintaining a blood pressure log which shows well-controlled blood pressures in the 120s-130 systolic over 70s diastolic.  His weight has remained stable.  I will order a BMP, continue his current medical therapy, have him maintain his low-sodium diet and physical activity, and plan follow-up in.  Today he denies chest pain, shortness of breath, lower extremity edema, fatigue, palpitations, melena, hematuria, hemoptysis, diaphoresis, weakness, presyncope, syncope, orthopnea, and PND.   Home Medications    Prior to Admission medications   Medication Sig Start Date End Date Taking? Authorizing Provider  acetaminophen (TYLENOL) 500 MG tablet Take 1,000 mg by mouth every 4 (four) hours as needed for moderate pain.    [provider]  apixaban (ELIQUIS) 5 MG TABS tablet Take 1 tablet (5 mg total) by mouth 2 (two) times daily. 06/14/23   Swaziland, Peter M, MD  apixaban (ELIQUIS) 5 MG TABS tablet Take 1 tablet (5 mg total) by mouth 2 (two) times daily. 06/14/23   [place]and, Peter M, MD  atorvastatin (LIPITOR) 40 MG tablet Take 40 mg by mouth every morning.    [provider]  baclofen (LIORESAL) 10 MG tablet Take 10 mg by mouth 2 (two) times daily. 03/21/17   [provider]  Cholecalciferol (VITAMIN D-3) 1000 UNITS CAPS Take 2,000 Units by mouth daily.    [provider]  clotrimazole-betamethasone (LOTRISONE) cream Apply 1 Application topically 2 (two) times daily as needed (itching). 04/13/22   [provider]  gabapentin (NEURONTIN) 300 MG capsule Take 300 mg by mouth 3 (three) times daily. Patient not taking: Reported on 06/14/2023 09/16/20   [provider]  gabapentin (NEURONTIN) 300 MG capsule Take 900 mg by mouth 3 (three) times daily.    [provider]  hydrochlorothiazide (HYDRODIURIL) 12.5 MG tablet Take 12.5 mg by mouth every morning. 09/08/20   [provider]  HYDROcodone-acetaminophen (NORCO) 10-325 MG tablet Take 1 tablet by mouth 4 (four) times daily as needed. 12/24/22   [provider]  hydroxyurea (HYDREA) 500 MG capsule Take 2 capsules (1,000 mg total) by mouth daily on Monday/Tuesday/Wednesday/Thursday and 1 cap (500mg ) po daily on Friday,Saturday and sunday. May take with food to minimize GI side effects. 06/02/23   Johney Maine, MD  Multiple Vitamin (MULITIVITAMIN WITH MINERALS) TABS Take 2 tablets by mouth daily.     [provider]  Omega-3 Fatty Acids (SEA-OMEGA 30 PO) Take 1 capsule by mouth daily.    [provider]  oxyCODONE-acetaminophen (PERCOCET) 10-325 MG tablet Take 1 tablet by mouth every 4 (four) hours as needed. 05/24/23   [provider]  oxyCODONE-acetaminophen (PERCOCET) 7.5-325 MG tablet Take 1 tablet by mouth every 6 (six) hours. 05/01/22   [provider]    Family History    Family History  Problem Relation Age of Onset   Hypertension Mother    He indicated that his mother is deceased. He indicated that his father is deceased.  Social History    Social History   Socioeconomic History   Marital status: Married    Spouse name: Not on file   Number of children: 3   Years of education: Not on file   Highest education level: Not on file  Occupational History   Not on file  Tobacco Use   Smoking status: Former    Current  packs/day: 0.00    Types: Cigarettes    Quit date: 11/16/1963    Years since quitting: 59.7   Smokeless tobacco: Never  Vaping Use   Vaping status: Never Used  Substance and Sexual Activity   Alcohol use: Yes    Alcohol/week: 2.0 standard drinks of alcohol    Types: 2 Cans of beer per week   Drug use: No   Sexual activity: Not on file  Other Topics Concern   Not on file  Social History Narrative   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    Social Determinants of Health   Financial Resource Strain: Not on file  Food Insecurity: Not on file  Transportation Needs: Not on file  Physical Activity: Not on file  Stress: Not on file  Social Connections: Not on file  Intimate Partner Violence: Not on file     Review of Systems    General:  No chills, fever, night sweats or weight changes.  Cardiovascular:  No chest pain, dyspnea  on exertion, edema, orthopnea, palpitations, paroxysmal nocturnal dyspnea. Dermatological: No rash, lesions/masses Respiratory: No cough, dyspnea Urologic: No hematuria, dysuria Abdominal:   No nausea, vomiting, diarrhea, bright red blood per rectum, melena, or hematemesis Neurologic:  No visual changes, wkns, changes in mental status. All other systems reviewed and are otherwise negative except as noted above.  Physical Exam    VS:  BP 132/84   Ht 5\' 10"  (1.778 m)   Wt 190 lb 3.2 oz (86.3 kg)   BMI 27.29 kg/m  , BMI Body mass index is 27.29 kg/m. GEN: Well nourished, well developed, in no acute distress. HEENT: normal. Neck: Supple, no JVD, carotid bruits, or masses. Cardiac: Irregularly irregular, no murmurs, rubs, or gallops. No clubbing, cyanosis, edema.  Radials/DP/PT 2+ and equal bilaterally.  Respiratory:  Respirations regular and unlabored, clear to auscultation bilaterally. GI: Soft, nontender, nondistended, BS + x 4. MS: no deformity or atrophy. Skin: warm and dry, no rash. Neuro:  Strength and sensation are intact. Psych: Normal  affect.  Accessory Clinical Findings    Recent Labs: 06/02/2023: ALT 19; BUN 19; Creatinine 0.88; Hemoglobin 15.2; Platelet Count 252; Potassium 4.3; Sodium 140 06/14/2023: TSH 2.150   Recent Lipid Panel    Component Value Date/Time   CHOL 140 06/14/2023 0928   TRIG 235 (H) 06/14/2023 0928   HDL 43 06/14/2023 0928   CHOLHDL 3.3 06/14/2023 0928   LDLCALC 59 06/14/2023 0928         ECG personally reviewed by me today-  EKG Interpretation Date/Time:  Monday July 25 2023 08:50:15 EDT Ventricular Rate:  56 PR Interval:    QRS Duration:  106 QT Interval:  376 QTC Calculation: 362 R Axis:   72  Text Interpretation: Atrial fibrillation with slow ventricular response When compared with ECG of 14-Jun-2023 08:03, QT has shortened Confirmed by Edd Fabian [phone]) on 07/25/2023 8:55:08 AM    Echocardiogram 07/04/2023  IMPRESSIONS     1. Left ventricular ejection fraction, by estimation, is 60 to 65%. The  left ventricle has normal function. The left ventricle has no regional  wall motion abnormalities. There is mild left ventricular hypertrophy.  Left ventricular diastolic parameters  are indeterminate.   2. Right ventricular systolic function is normal. The right ventricular  size is normal. There is normal pulmonary artery systolic pressure. The  estimated right ventricular systolic pressure is 29.6 mmHg.   3. Left atrial size was severely dilated.   4. The mitral valve is normal in structure. Moderate mitral valve  regurgitation. Central MR jet, suspect atrial functional MR due to atrial  dilatation from chronic Afib   5. Tricuspid valve regurgitation is moderate.   6. The aortic valve is tricuspid. Aortic valve regurgitation is mild.  Mild aortic valve stenosis. Vmax 2.1 m/s, MG , AVA 1.7 cm^2, DI 0.5   7. The inferior vena cava is normal in size with greater than 50%  respiratory variability, suggesting right atrial pressure of 3 mmHg.   FINDINGS   Left  Ventricle: Left ventricular ejection fraction, by estimation, is 60  to 65%. The left ventricle has normal function. The left ventricle has no  regional wall motion abnormalities. The left ventricular internal cavity  size was normal in size. There is   mild left ventricular hypertrophy. Left ventricular diastolic parameters  are indeterminate.   Right Ventricle: The right ventricular size is normal. No increase in  right ventricular wall thickness. Right ventricular systolic function is  normal. There is normal pulmonary artery  systolic pressure. The tricuspid  regurgitant velocity is 2.58 m/s, and   with an assumed right atrial pressure of 3 mmHg, the estimated right  ventricular systolic pressure is 29.6 mmHg.   Left Atrium: Left atrial size was severely dilated.   Right Atrium: Right atrial size was normal in size.   Pericardium: There is no evidence of pericardial effusion.   Mitral Valve: The mitral valve is normal in structure. Moderate mitral  valve regurgitation.   Tricuspid Valve: The tricuspid valve is normal in structure. Tricuspid  valve regurgitation is moderate.   Aortic Valve: The aortic valve is tricuspid. Aortic valve regurgitation is  mild. Aortic regurgitation PHT measures 574 msec. Mild aortic stenosis is  present.   Pulmonic Valve: The pulmonic valve was grossly normal. Pulmonic valve  regurgitation is trivial.   Aorta: The aortic root and ascending aorta are structurally normal, with  no evidence of dilitation.   Venous: The inferior vena cava is normal in size with greater than 50%  respiratory variability, suggesting right atrial pressure of 3 mmHg.   IAS/Shunts: The interatrial septum was not well visualized.       Assessment & Plan   1.  Essential hypertension-BP today 132/84.  Well-controlled at home. Maintain blood pressure log Continue current medical therapy Heart healthy low-sodium diet Order BMP  Atrial fibrillation-EKG today shows  atrial fibrillation.  Reports compliance with apixaban.  Denies bleeding issues.  CHA2DS2-VASc score 3 (hypertension, age x 2) Continue apixaban Avoid triggers caffeine, chocolate, EtOH, dehydration etc.  Hyperlipidemia-LDL 59 on 06/14/2023. High-fiber diet Continue atorvastatin, omega-3 fatty acids  Preoperative cardiac evaluation-colonoscopy, Dr. Liliane Shi, [facility], fax #[phone]   Primary Cardiologist: Dr. Peter Swaziland  Chart reviewed as part of pre-operative protocol coverage. Given past medical history and time since last visit, based on ACC/AHA guidelines, Waylynn Leinberger Irby would be at acceptable risk for the planned procedure without further cardiovascular testing.   Patient was advised that if he develops new symptoms prior to surgery to contact our office to arrange a follow-up appointment.  He verbalized understanding.  Patient with diagnosis of afib on Eliquis for anticoagulation.     Procedure: colonoscopy Date of procedure: 07/27/23   CHA2DS2-VASc Score = 3  This indicates a 3.2% annual risk of stroke. The patient's score is based upon: CHF History: 0 HTN History: 1 Diabetes History: 0 Stroke History: 0 Vascular Disease History: 0 Age Score: 2 Gender Score: 0   CrCl 56mL/min Platelet count 252K   Per office protocol, patient can hold Eliquis for 1-2 days prior to procedure.   Disposition: Follow-up with Dr. Swaziland or me in 12 months.   Thomasene Ripple. Tucker Steedley NP-C     07/25/2023, 9:13 AM [facility] [address] Office [phone] Fax [phone]    I spent 13 minutes examining this patient, reviewing medications, and using patient centered shared decision making involving her cardiac care.  Prior to her visit I spent greater than 20 minutes reviewing her past medical history,  medications, and prior cardiac tests.

## 2023-07-25 ENCOUNTER — Ambulatory Visit: Payer: Medicare Other | Attending: General Practice | Admitting: General Practice

## 2023-07-25 ENCOUNTER — Encounter: Payer: Self-pay | Admitting: General Practice

## 2023-07-25 VITALS — BP 132/84 | Ht 70.0 in | Wt 190.2 lb

## 2023-07-25 DIAGNOSIS — Z01810 Encounter for preprocedural cardiovascular examination: Secondary | ICD-10-CM

## 2023-07-25 DIAGNOSIS — E782 Mixed hyperlipidemia: Secondary | ICD-10-CM

## 2023-07-25 DIAGNOSIS — I4811 Longstanding persistent atrial fibrillation: Secondary | ICD-10-CM | POA: Diagnosis not present

## 2023-07-25 DIAGNOSIS — I10 Essential (primary) hypertension: Secondary | ICD-10-CM

## 2023-07-25 NOTE — Patient Instructions (Signed)
Medication Instructions:  The current medical regimen is effective;  continue present plan and medications as directed. Please refer to the Current Medication list given to you today.  *If you need a refill on your cardiac medications before your next appointment, please call your pharmacy*  Lab Work: BMET TODAY If you have labs (blood work) drawn today and your tests are completely normal, you will receive your results only by:  MyChart Message (if you have MyChart) OR  A paper copy in the mail If you have any lab test that is abnormal or we need to change your treatment, we will call you to review the results.  Other Instructions MAINTAIN YOUR PHYSICAL ACTIVITY PLEASE READ AND FOLLOW ATTACHED  SALTY 6   Follow-Up: At [facility], you and your health needs are our priority.  As part of our continuing mission to provide you with exceptional heart care, we have created designated Provider Care Teams.  These Care Teams include your primary Cardiologist (physician) and Advanced Practice Providers (APPs -  Physician Assistants and Nurse Practitioners) who all work together to provide you with the care you need, when you need it.  We recommend signing up for the patient portal called "MyChart".  Sign up information is provided on this After Visit Summary.  MyChart is used to connect with patients for Virtual Visits (Telemedicine).  Patients are able to view lab/test results, encounter notes, upcoming appointments, etc.  Non-urgent messages can be sent to your provider as well.   To learn more about what you can do with MyChart, go to [email or web address].    Your next appointment:   12 month(s)  Provider:   Peter Swaziland, MD

## 2023-07-26 DIAGNOSIS — M62830 Muscle spasm of back: Secondary | ICD-10-CM | POA: Diagnosis not present

## 2023-07-26 DIAGNOSIS — M150 Primary generalized (osteo)arthritis: Secondary | ICD-10-CM | POA: Diagnosis not present

## 2023-07-26 DIAGNOSIS — M47817 Spondylosis without myelopathy or radiculopathy, lumbosacral region: Secondary | ICD-10-CM | POA: Diagnosis not present

## 2023-07-26 DIAGNOSIS — G894 Chronic pain syndrome: Secondary | ICD-10-CM | POA: Diagnosis not present

## 2023-07-26 LAB — BASIC METABOLIC PANEL
BUN/Creatinine Ratio: 24 (ref 10–24)
BUN: 17 mg/dL (ref 8–27)
CO2: 28 mmol/L (ref 20–29)
Calcium: 9.6 mg/dL (ref 8.6–10.2)
Chloride: 99 mmol/L (ref 96–106)
Creatinine, Ser: 0.7 mg/dL — ABNORMAL LOW (ref 0.76–1.27)
Glucose: 92 mg/dL (ref 70–99)
Potassium: 4.9 mmol/L (ref 3.5–5.2)
Sodium: 141 mmol/L (ref 134–144)
eGFR: 95 mL/min/{1.73_m2} (ref >59)

## 2023-07-27 DIAGNOSIS — D123 Benign neoplasm of transverse colon: Secondary | ICD-10-CM | POA: Diagnosis not present

## 2023-07-27 DIAGNOSIS — K5730 Diverticulosis of large intestine without perforation or abscess without bleeding: Secondary | ICD-10-CM | POA: Diagnosis not present

## 2023-07-27 DIAGNOSIS — K640 First degree hemorrhoids: Secondary | ICD-10-CM | POA: Diagnosis not present

## 2023-07-27 DIAGNOSIS — Z09 Encounter for follow-up examination after completed treatment for conditions other than malignant neoplasm: Secondary | ICD-10-CM | POA: Diagnosis not present

## 2023-07-27 DIAGNOSIS — Z86010 Personal history of colonic polyps: Secondary | ICD-10-CM | POA: Diagnosis not present

## 2023-07-27 DIAGNOSIS — D124 Benign neoplasm of descending colon: Secondary | ICD-10-CM | POA: Diagnosis not present

## 2023-07-27 DIAGNOSIS — D125 Benign neoplasm of sigmoid colon: Secondary | ICD-10-CM | POA: Diagnosis not present

## 2023-07-27 DIAGNOSIS — D122 Benign neoplasm of ascending colon: Secondary | ICD-10-CM | POA: Diagnosis not present

## 2023-07-29 DIAGNOSIS — D124 Benign neoplasm of descending colon: Secondary | ICD-10-CM | POA: Diagnosis not present

## 2023-08-08 DIAGNOSIS — D2239 Melanocytic nevi of other parts of face: Secondary | ICD-10-CM | POA: Diagnosis not present

## 2023-08-08 DIAGNOSIS — Z85828 Personal history of other malignant neoplasm of skin: Secondary | ICD-10-CM | POA: Diagnosis not present

## 2023-08-08 DIAGNOSIS — L570 Actinic keratosis: Secondary | ICD-10-CM | POA: Diagnosis not present

## 2023-08-08 DIAGNOSIS — L821 Other seborrheic keratosis: Secondary | ICD-10-CM | POA: Diagnosis not present

## 2023-08-08 DIAGNOSIS — C4441 Basal cell carcinoma of skin of scalp and neck: Secondary | ICD-10-CM | POA: Diagnosis not present

## 2023-08-08 DIAGNOSIS — L820 Inflamed seborrheic keratosis: Secondary | ICD-10-CM | POA: Diagnosis not present

## 2023-08-08 DIAGNOSIS — L720 Epidermal cyst: Secondary | ICD-10-CM | POA: Diagnosis not present

## 2023-08-15 ENCOUNTER — Ambulatory Visit: Payer: Medicare Other | Admitting: Physician Assistant

## 2023-08-24 ENCOUNTER — Other Ambulatory Visit: Payer: Self-pay | Admitting: Hematology

## 2023-08-24 DIAGNOSIS — D473 Essential (hemorrhagic) thrombocythemia: Secondary | ICD-10-CM

## 2023-09-26 ENCOUNTER — Ambulatory Visit: Payer: Medicare Other | Admitting: Hematology

## 2023-09-26 ENCOUNTER — Other Ambulatory Visit: Payer: Medicare Other

## 2023-09-26 ENCOUNTER — Other Ambulatory Visit: Payer: Self-pay

## 2023-09-26 DIAGNOSIS — D473 Essential (hemorrhagic) thrombocythemia: Secondary | ICD-10-CM

## 2023-09-27 ENCOUNTER — Inpatient Hospital Stay: Payer: Medicare Other | Attending: Hematology

## 2023-09-27 ENCOUNTER — Inpatient Hospital Stay: Payer: Medicare Other | Admitting: Hematology

## 2023-09-27 VITALS — BP 151/71 | HR 109 | Temp 97.7°F | Resp 18 | Wt 196.2 lb

## 2023-09-27 DIAGNOSIS — M549 Dorsalgia, unspecified: Secondary | ICD-10-CM | POA: Insufficient documentation

## 2023-09-27 DIAGNOSIS — Z7964 Long term (current) use of myelosuppressive agent: Secondary | ICD-10-CM | POA: Diagnosis not present

## 2023-09-27 DIAGNOSIS — Z87891 Personal history of nicotine dependence: Secondary | ICD-10-CM | POA: Diagnosis not present

## 2023-09-27 DIAGNOSIS — I4891 Unspecified atrial fibrillation: Secondary | ICD-10-CM | POA: Insufficient documentation

## 2023-09-27 DIAGNOSIS — M25559 Pain in unspecified hip: Secondary | ICD-10-CM | POA: Insufficient documentation

## 2023-09-27 DIAGNOSIS — Z7901 Long term (current) use of anticoagulants: Secondary | ICD-10-CM | POA: Diagnosis not present

## 2023-09-27 DIAGNOSIS — D473 Essential (hemorrhagic) thrombocythemia: Secondary | ICD-10-CM | POA: Diagnosis not present

## 2023-09-27 DIAGNOSIS — Z888 Allergy status to other drugs, medicaments and biological substances status: Secondary | ICD-10-CM | POA: Insufficient documentation

## 2023-09-27 DIAGNOSIS — M25511 Pain in right shoulder: Secondary | ICD-10-CM | POA: Diagnosis not present

## 2023-09-27 DIAGNOSIS — G8929 Other chronic pain: Secondary | ICD-10-CM | POA: Insufficient documentation

## 2023-09-27 DIAGNOSIS — I10 Essential (primary) hypertension: Secondary | ICD-10-CM | POA: Diagnosis not present

## 2023-09-27 DIAGNOSIS — R634 Abnormal weight loss: Secondary | ICD-10-CM | POA: Insufficient documentation

## 2023-09-27 DIAGNOSIS — D75839 Thrombocytosis, unspecified: Secondary | ICD-10-CM | POA: Diagnosis not present

## 2023-09-27 DIAGNOSIS — Z79899 Other long term (current) drug therapy: Secondary | ICD-10-CM | POA: Insufficient documentation

## 2023-09-27 DIAGNOSIS — Z6828 Body mass index (BMI) 28.0-28.9, adult: Secondary | ICD-10-CM | POA: Insufficient documentation

## 2023-09-27 LAB — CMP (CANCER CENTER ONLY)
ALT: 17 U/L (ref 0–44)
AST: 26 U/L (ref 15–41)
Albumin: 4.3 g/dL (ref 3.5–5.0)
Alkaline Phosphatase: 74 U/L (ref 38–126)
Anion gap: 5 (ref 5–15)
BUN: 17 mg/dL (ref 8–23)
CO2: 33 mmol/L — ABNORMAL HIGH (ref 22–32)
Calcium: 9.2 mg/dL (ref 8.9–10.3)
Chloride: 104 mmol/L (ref 98–111)
Creatinine: 0.88 mg/dL (ref 0.61–1.24)
GFR, Estimated: >60 mL/min (ref >60)
Glucose, Bld: 75 mg/dL (ref 70–99)
Potassium: 4.8 mmol/L (ref 3.5–5.1)
Sodium: 142 mmol/L (ref 135–145)
Total Bilirubin: 0.6 mg/dL (ref <1.2)
Total Protein: 7.1 g/dL (ref 6.5–8.1)

## 2023-09-27 LAB — CBC WITH DIFFERENTIAL (CANCER CENTER ONLY)
Abs Immature Granulocytes: 0.01 10*3/uL (ref 0.00–0.07)
Basophils Absolute: 0 10*3/uL (ref 0.0–0.1)
Basophils Relative: 1 %
Eosinophils Absolute: 0.1 10*3/uL (ref 0.0–0.5)
Eosinophils Relative: 3 %
HCT: 40.4 % (ref 39.0–52.0)
Hemoglobin: 14 g/dL (ref 13.0–17.0)
Immature Granulocytes: 0 %
Lymphocytes Relative: 21 %
Lymphs Abs: 0.9 10*3/uL (ref 0.7–4.0)
MCH: 39 pg — ABNORMAL HIGH (ref 26.0–34.0)
MCHC: 34.7 g/dL (ref 30.0–36.0)
MCV: 112.5 fL — ABNORMAL HIGH (ref 80.0–100.0)
Monocytes Absolute: 0.7 10*3/uL (ref 0.1–1.0)
Monocytes Relative: 16 %
Neutro Abs: 2.5 10*3/uL (ref 1.7–7.7)
Neutrophils Relative %: 59 %
Platelet Count: 233 10*3/uL (ref 150–400)
RBC: 3.59 MIL/uL — ABNORMAL LOW (ref 4.22–5.81)
RDW: 13.2 % (ref 11.5–15.5)
WBC Count: 4.2 10*3/uL (ref 4.0–10.5)
nRBC: 0 % (ref 0.0–0.2)

## 2023-09-27 NOTE — Progress Notes (Signed)
HEMATOLOGY ONCOLOGY PROGRESS NOTE  Date of service:  09/27/2023  Patient Care Team: Joya Martyr, MD as PCP - General (Internal Medicine) [place], Peter M, MD as PCP - Cardiology (Cardiology)  CC Follow-up for continued evaluation and management of essential thrombocythemia.  Diagnosis:  JAK2 Essential thrombocytosis   Current Treatment: Hydroxyurea 1000 mg daily  INTERVAL HISTORY:  Jordan Vazquez is a 77 y.o. male here for continued evaluation and management of his JAK2 positive essential thrombocytosis.   Patient was last seen by me on 06/02/2023 and he complained of chronic back pain with neck stiffness, which he attributed it to arthritis.   Patient notes he has been doing well overall since our last visit. He complains of chronic back, neck stiffness, joint pain, right shoulder pain, and hip pain due to arthritis. Patient notes that his arthritis has been affecting his walking.   Patient notes his Oxycodone dose was changed to 7.5 mg. He has discontinued Hydrocodone and Lotrisone cream. He has been prescribed Eliquis for A-Fib around 3-4 months ago by his Cardiologist. Patient notes that he was first diagnosed with A-Fib around 1999, but was not prescribed Eliquis until now.   He is currently taking two Hydroxyurea tablets four times a day and one tablet rest of the week. He has been tolerating his current Hydroxyurea well without any new or severe toxicities.   He denies any new infection issues, fever, chills, night sweats, abdominal pain, bone pain, chest pain, or leg swelling. He has lost weight since our last visit due to not feeling good and appetite loss. Pt notes he has lost around 20-30 lbs since our last visit.    REVIEW OF SYSTEMS:    10 Point review of Systems was done is negative except as noted above.   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    Hyperlipidemia    Hypertension    Peripheral neuropathy     Past Surgical History:  Procedure Laterality Date   CARDIAC CATHETERIZATION     16 years ago negative    HERNIA REPAIR     right inguinal hernia surgery    I & D KNEE WITH POLY EXCHANGE  12/06/2011   Procedure: IRRIGATION AND DEBRIDEMENT KNEE WITH POLY EXCHANGE;  Surgeon: Shelda Pal, MD;  Location: [facility];  Service: Orthopedics;  Laterality: Right;  Right Total Knee Irrigation and Debridement with Poly Exchange   JOINT REPLACEMENT     left knee 5/12, right TKA 10/12    TOE AMPUTATION Right     Social History   Tobacco Use   Smoking status: Former    Current packs/day: 0.00    Types: Cigarettes    Quit date: 11/16/1963    Years since quitting: 59.9   Smokeless tobacco: Never  Vaping Use   Vaping status: Never Used  Substance Use Topics   Alcohol use: Yes    Alcohol/week: 2.0 standard drinks of alcohol    Types: 2 Cans of beer per week   Drug use: No    ALLERGIES:  is allergic to niacin, niacin and related, duloxetine hcl, and simvastatin.  MEDICATIONS:  Current Outpatient Medications  Medication Sig Dispense Refill   acetaminophen (TYLENOL) 500 MG tablet Take 1,000 mg by mouth every 4 (four) hours as needed for moderate pain.     apixaban (  ELIQUIS) 5 MG TABS tablet Take 1 tablet (5 mg total) by mouth 2 (two) times daily. 60 tablet 11   apixaban (ELIQUIS) 5 MG TABS tablet Take 1 tablet (5 mg total) by mouth 2 (two) times daily. (Patient not taking: Reported on 07/25/2023) 28 tablet 0   atorvastatin (LIPITOR) 40 MG tablet Take 40 mg by mouth every morning.     baclofen (LIORESAL) 10 MG tablet Take 10 mg by mouth 2 (two) times daily.  0   Cholecalciferol (VITAMIN D-3) 1000 UNITS CAPS Take 2,000 Units by mouth daily.     clotrimazole-betamethasone (LOTRISONE) cream Apply 1 Application topically 2 (two) times daily as needed (itching). (Patient not  taking: Reported on 07/25/2023)     gabapentin (NEURONTIN) 300 MG capsule Take 300 mg by mouth 3 (three) times daily. (Patient not taking: Reported on 06/14/2023)     gabapentin (NEURONTIN) 300 MG capsule Take 900 mg by mouth 3 (three) times daily.     hydrochlorothiazide (HYDRODIURIL) 12.5 MG tablet Take 12.5 mg by mouth every morning.     HYDROcodone-acetaminophen (NORCO) 10-325 MG tablet Take 1 tablet by mouth 4 (four) times daily as needed. (Patient not taking: Reported on 07/25/2023)     hydroxyurea (HYDREA) 500 MG capsule Take 2 capsules (1,000 mg total) by mouth daily on Monday/Tuesday/Wednesday/Thursday/Friday and 1 cap (500mg ) po daily on Saturday and sunday. May take with food to minimize GI side effects. 180 capsule 1   Multiple Vitamin (MULITIVITAMIN WITH MINERALS) TABS Take 2 tablets by mouth daily.      Omega-3 Fatty Acids (SEA-OMEGA 30 PO) Take 1 capsule by mouth daily.     oxyCODONE-acetaminophen (PERCOCET) 10-325 MG tablet Take 1 tablet by mouth every 4 (four) hours as needed. (Patient not taking: Reported on 07/25/2023)     oxyCODONE-acetaminophen (PERCOCET) 7.5-325 MG tablet Take 1 tablet by mouth every 6 (six) hours.     No current facility-administered medications for this visit.    PHYSICAL EXAMINATION: ECOG PERFORMANCE STATUS: 1 - Symptomatic but completely ambulatory  Vitals:   09/27/23 0853  BP: (!) 151/71  Pulse: (!) 109  Resp: 18  Temp: 97.7 F (36.5 C)  SpO2: 100%     Filed Weights   09/27/23 0853  Weight: 196 lb 3.2 oz (89 kg)     .Body mass index is 28.15 kg/m.    GENERAL:alert, in no acute distress and comfortable SKIN: no acute rashes, no significant lesions EYES: conjunctiva are pink and non-injected, sclera anicteric OROPHARYNX: MMM, no exudates, no oropharyngeal erythema or ulceration NECK: supple, no JVD LYMPH:  no palpable lymphadenopathy in the cervical, axillary or inguinal regions LUNGS: clear to auscultation b/l with normal respiratory  effort HEART: regular rate & rhythm ABDOMEN:  normoactive bowel sounds , non tender, not distended. Extremity: no pedal edema PSYCH: alert & oriented x 3 with fluent speech NEURO: no focal motor/sensory deficits   LABORATORY DATA:   I have reviewed the data as listed  .    Latest Ref Rng & Units 09/27/2023    7:18 AM 06/02/2023    7:37 AM 01/31/2023    7:23 AM  CBC  WBC 4.0 - 10.5 K/uL 4.2  4.5  5.5   Hemoglobin 13.0 - 17.0 g/dL 16.1  09.6  04.5   Hematocrit 39.0 - 52.0 % 40.4  42.6  42.8   Platelets 150 - 400 K/uL 233  252  275       Latest Ref Rng & Units 09/27/2023  7:18 AM 07/25/2023    9:22 AM 06/02/2023    7:37 AM  CMP  Glucose 70 - 99 mg/dL 75  92  86   BUN 8 - 23 mg/dL 17  17  19    Creatinine 0.61 - 1.24 mg/dL 2.84  1.32  4.40   Sodium 135 - 145 mmol/L 142  141  140   Potassium 3.5 - 5.1 mmol/L 4.8  4.9  4.3   Chloride 98 - 111 mmol/L 104  99  103   CO2 22 - 32 mmol/L 33  28  31   Calcium 8.9 - 10.3 mg/dL 9.2  9.6  9.5   Total Protein 6.5 - 8.1 g/dL 7.1   7.4   Total Bilirubin <1.2 mg/dL 0.6   0.9   Alkaline Phos 38 - 126 U/L 74   60   AST 15 - 41 U/L 26   28   ALT 0 - 44 U/L 17   19       RADIOGRAPHIC STUDIES:  I have personally reviewed the radiological images as listed and agreed with the findings in the report.  [place] abd 03/15/2017: IMPRESSION: 1. Liver is echogenic consistent with fatty infiltration and/or hepatocellular disease. No focal hepatic abnormality. No gallstones or biliary distention.   2. Spleen is enlarged at 14.4 cm with a volume of 620 cc.    Electronically Signed   By: Maisie Fus  Register   On: 03/15/2017 09:15   ASSESSMENT & PLAN: '  77 y.o.  Caucasian male with   #1  JAK2 positive essential thrombocytosis #2 Abnormal LFTs - AST and ALT have normalized on labs today.  Likely from fatty liver. -will continue to monitor.  PLAN:  -Discussed lab results from today, 09/27/2023, in detail with the patient. CBC stable. CMP stable.   -Patient has been tolerating his current dose of hydroxyurea well without any new or severe toxicities.  -Change hydroxyurea dose to 2 tablets 3 days a week and then 1 tablet rest of the week.  -Discussed with the patient that hydroxyurea dosage does depend on weight.  -No indication for complications from his essential thrombocytosis at this time. -answered all of patient's question's in detail regarding medication intake -will continue to monitor with labs in 3 months and phone visit. -Recommend to follow-up with PCP regarding neck stiffness and neck pain.   FOLLOW UP: Phone visit with Dr. Candise Che in 3 months. Labs 1 day prior.  RTC with Dr. Candise Che with labs in 6 months.   The total time spent in the appointment was 20 minutes* .  All of the patient's questions were answered with apparent satisfaction. The patient knows to call the clinic with any problems, questions or concerns.   Wyvonnia Lora MD MS AAHIVMS [facility] Hematology/Oncology Physician [facility]  .*Total Encounter Time as defined by the Centers for Medicare and Medicaid Services includes, in addition to the face-to-face time of a patient visit (documented in the note above) non-face-to-face time: obtaining and reviewing outside history, ordering and reviewing medications, tests or procedures, care coordination (communications with other health care professionals or caregivers) and documentation in the medical record.   I,Param Shah,acting as a Neurosurgeon for Wyvonnia Lora, MD.,have documented all relevant documentation on the behalf of Wyvonnia Lora, MD,as directed by  Wyvonnia Lora, MD while in the presence of Wyvonnia Lora, MD.  .I have reviewed the above documentation for accuracy and completeness, and I agree with the above. Johney Maine MD

## 2023-10-04 DIAGNOSIS — M47817 Spondylosis without myelopathy or radiculopathy, lumbosacral region: Secondary | ICD-10-CM | POA: Diagnosis not present

## 2023-10-04 DIAGNOSIS — Z79891 Long term (current) use of opiate analgesic: Secondary | ICD-10-CM | POA: Diagnosis not present

## 2023-10-04 DIAGNOSIS — G894 Chronic pain syndrome: Secondary | ICD-10-CM | POA: Diagnosis not present

## 2023-10-04 DIAGNOSIS — M150 Primary generalized (osteo)arthritis: Secondary | ICD-10-CM | POA: Diagnosis not present

## 2023-10-04 DIAGNOSIS — M62830 Muscle spasm of back: Secondary | ICD-10-CM | POA: Diagnosis not present

## 2023-10-12 DIAGNOSIS — Z23 Encounter for immunization: Secondary | ICD-10-CM | POA: Diagnosis not present

## 2023-10-12 DIAGNOSIS — Z0000 Encounter for general adult medical examination without abnormal findings: Secondary | ICD-10-CM | POA: Diagnosis not present

## 2023-10-12 DIAGNOSIS — D473 Essential (hemorrhagic) thrombocythemia: Secondary | ICD-10-CM | POA: Diagnosis not present

## 2023-10-12 DIAGNOSIS — M48062 Spinal stenosis, lumbar region with neurogenic claudication: Secondary | ICD-10-CM | POA: Diagnosis not present

## 2023-10-12 DIAGNOSIS — I4819 Other persistent atrial fibrillation: Secondary | ICD-10-CM | POA: Diagnosis not present

## 2023-10-12 DIAGNOSIS — I10 Essential (primary) hypertension: Secondary | ICD-10-CM | POA: Diagnosis not present

## 2023-11-29 DIAGNOSIS — G894 Chronic pain syndrome: Secondary | ICD-10-CM | POA: Diagnosis not present

## 2023-11-29 DIAGNOSIS — M47817 Spondylosis without myelopathy or radiculopathy, lumbosacral region: Secondary | ICD-10-CM | POA: Diagnosis not present

## 2023-11-29 DIAGNOSIS — M62830 Muscle spasm of back: Secondary | ICD-10-CM | POA: Diagnosis not present

## 2023-11-29 DIAGNOSIS — M150 Primary generalized (osteo)arthritis: Secondary | ICD-10-CM | POA: Diagnosis not present

## 2023-12-16 ENCOUNTER — Other Ambulatory Visit: Payer: Self-pay

## 2023-12-16 DIAGNOSIS — D473 Essential (hemorrhagic) thrombocythemia: Secondary | ICD-10-CM

## 2023-12-19 ENCOUNTER — Inpatient Hospital Stay: Payer: Medicare Other | Attending: Hematology

## 2023-12-19 DIAGNOSIS — M25511 Pain in right shoulder: Secondary | ICD-10-CM | POA: Insufficient documentation

## 2023-12-19 DIAGNOSIS — M549 Dorsalgia, unspecified: Secondary | ICD-10-CM | POA: Insufficient documentation

## 2023-12-19 DIAGNOSIS — G8929 Other chronic pain: Secondary | ICD-10-CM | POA: Diagnosis not present

## 2023-12-19 DIAGNOSIS — M25559 Pain in unspecified hip: Secondary | ICD-10-CM | POA: Diagnosis not present

## 2023-12-19 DIAGNOSIS — Z7901 Long term (current) use of anticoagulants: Secondary | ICD-10-CM | POA: Insufficient documentation

## 2023-12-19 DIAGNOSIS — Z87891 Personal history of nicotine dependence: Secondary | ICD-10-CM | POA: Diagnosis not present

## 2023-12-19 DIAGNOSIS — D75839 Thrombocytosis, unspecified: Secondary | ICD-10-CM | POA: Diagnosis not present

## 2023-12-19 DIAGNOSIS — Z888 Allergy status to other drugs, medicaments and biological substances status: Secondary | ICD-10-CM | POA: Insufficient documentation

## 2023-12-19 DIAGNOSIS — D473 Essential (hemorrhagic) thrombocythemia: Secondary | ICD-10-CM | POA: Insufficient documentation

## 2023-12-19 DIAGNOSIS — Z96653 Presence of artificial knee joint, bilateral: Secondary | ICD-10-CM | POA: Insufficient documentation

## 2023-12-19 DIAGNOSIS — Z79899 Other long term (current) drug therapy: Secondary | ICD-10-CM | POA: Diagnosis not present

## 2023-12-19 DIAGNOSIS — Z7964 Long term (current) use of myelosuppressive agent: Secondary | ICD-10-CM | POA: Diagnosis not present

## 2023-12-19 LAB — CMP (CANCER CENTER ONLY)
ALT: 23 U/L (ref 0–44)
AST: 30 U/L (ref 15–41)
Albumin: 4.7 g/dL (ref 3.5–5.0)
Alkaline Phosphatase: 64 U/L (ref 38–126)
Anion gap: 5 (ref 5–15)
BUN: 19 mg/dL (ref 8–23)
CO2: 32 mmol/L (ref 22–32)
Calcium: 9.4 mg/dL (ref 8.9–10.3)
Chloride: 104 mmol/L (ref 98–111)
Creatinine: 0.84 mg/dL (ref 0.61–1.24)
GFR, Estimated: >60 mL/min
Glucose, Bld: 89 mg/dL (ref 70–99)
Potassium: 4.8 mmol/L (ref 3.5–5.1)
Sodium: 141 mmol/L (ref 135–145)
Total Bilirubin: 0.8 mg/dL (ref 0.0–1.2)
Total Protein: 7.6 g/dL (ref 6.5–8.1)

## 2023-12-19 LAB — CBC WITH DIFFERENTIAL (CANCER CENTER ONLY)
Abs Immature Granulocytes: 0.02 10*3/uL (ref 0.00–0.07)
Basophils Absolute: 0 10*3/uL (ref 0.0–0.1)
Basophils Relative: 1 %
Eosinophils Absolute: 0.1 10*3/uL (ref 0.0–0.5)
Eosinophils Relative: 1 %
HCT: 44.3 % (ref 39.0–52.0)
Hemoglobin: 15.7 g/dL (ref 13.0–17.0)
Immature Granulocytes: 0 %
Lymphocytes Relative: 16 %
Lymphs Abs: 0.8 10*3/uL (ref 0.7–4.0)
MCH: 38.3 pg — ABNORMAL HIGH (ref 26.0–34.0)
MCHC: 35.4 g/dL (ref 30.0–36.0)
MCV: 108 fL — ABNORMAL HIGH (ref 80.0–100.0)
Monocytes Absolute: 0.7 10*3/uL (ref 0.1–1.0)
Monocytes Relative: 14 %
Neutro Abs: 3.5 10*3/uL (ref 1.7–7.7)
Neutrophils Relative %: 68 %
Platelet Count: 259 10*3/uL (ref 150–400)
RBC: 4.1 MIL/uL — ABNORMAL LOW (ref 4.22–5.81)
RDW: 12.9 % (ref 11.5–15.5)
WBC Count: 5.1 10*3/uL (ref 4.0–10.5)
nRBC: 0 % (ref 0.0–0.2)

## 2023-12-26 ENCOUNTER — Inpatient Hospital Stay (HOSPITAL_BASED_OUTPATIENT_CLINIC_OR_DEPARTMENT_OTHER): Payer: Medicare Other | Admitting: Hematology

## 2023-12-26 DIAGNOSIS — M25559 Pain in unspecified hip: Secondary | ICD-10-CM | POA: Diagnosis not present

## 2023-12-26 DIAGNOSIS — D473 Essential (hemorrhagic) thrombocythemia: Secondary | ICD-10-CM | POA: Diagnosis not present

## 2023-12-26 DIAGNOSIS — M25511 Pain in right shoulder: Secondary | ICD-10-CM | POA: Diagnosis not present

## 2023-12-26 DIAGNOSIS — Z96653 Presence of artificial knee joint, bilateral: Secondary | ICD-10-CM | POA: Diagnosis not present

## 2023-12-26 DIAGNOSIS — Z7901 Long term (current) use of anticoagulants: Secondary | ICD-10-CM | POA: Diagnosis not present

## 2023-12-26 DIAGNOSIS — Z79899 Other long term (current) drug therapy: Secondary | ICD-10-CM | POA: Diagnosis not present

## 2023-12-26 DIAGNOSIS — Z7964 Long term (current) use of myelosuppressive agent: Secondary | ICD-10-CM | POA: Diagnosis not present

## 2023-12-26 DIAGNOSIS — Z888 Allergy status to other drugs, medicaments and biological substances status: Secondary | ICD-10-CM | POA: Diagnosis not present

## 2023-12-26 DIAGNOSIS — Z87891 Personal history of nicotine dependence: Secondary | ICD-10-CM | POA: Diagnosis not present

## 2023-12-26 DIAGNOSIS — M549 Dorsalgia, unspecified: Secondary | ICD-10-CM | POA: Diagnosis not present

## 2023-12-26 DIAGNOSIS — D75839 Thrombocytosis, unspecified: Secondary | ICD-10-CM | POA: Diagnosis not present

## 2023-12-26 DIAGNOSIS — G8929 Other chronic pain: Secondary | ICD-10-CM | POA: Diagnosis not present

## 2023-12-26 NOTE — Progress Notes (Signed)
 HEMATOLOGY ONCOLOGY PROGRESS NOTE  Date of service:  12/26/2023  Patient Care Team: Joya Martyr, MD as PCP - General (Internal Medicine) [place], Peter M, MD as PCP - Cardiology (Cardiology)  CC Follow-up for continued evaluation and management of essential thrombocythemia.  Diagnosis:  JAK2 Essential thrombocytosis   Current Treatment: Hydroxyurea 1000 mg 3 times a week and 500 mg rest of the week.   INTERVAL HISTORY:  [name] is a 78 y.o. male who is connected via phone for continued evaluation and management of his JAK2 positive essential thrombocytosis.   Patient was last seen by me on 09/27/2023 and he complained of chronic back pain, neck stiffness, joint pain, right shoulder pain, and hip pain due to arthritis.   .I connected with Jordan Vazquez on 12/26/2023 at  8:40 AM EST by telephone visit and verified that I am speaking with the correct person using two identifiers.   Patient notes he has been doing well overall without any new or severe medical concerns. He does complain of arthritis pain. He denies any new infection issues, fever, chills, night sweats, unexpected weight loss, back pain, bone pain, chest pain, or leg swelling.   Patient currently takes Hydroxyurea 1000 mg 3 times a week and then 500 mg rest of the week. He has been tolerating his current dose of hydroxyurea well without any new or severe toxicities.   Discussed lab results from 12/19/2023 in detail with the patient.   I discussed the limitations, risks, security and privacy concerns of performing an evaluation and management service by telemedicine and the availability of in-person appointments. I also discussed with the patient that there may be a patient responsible charge related to this service. The patient expressed understanding and agreed to proceed.   Other persons participating in the visit and their role in the encounter: None   Patient's location: Home  Provider's location: [facility]    Chief Complaint:  JAK2 positive essential thrombocytosis.     REVIEW OF SYSTEMS:    10 Point review of Systems was done is negative except as noted above.   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    Hyperlipidemia    Hypertension    Peripheral neuropathy     Past Surgical History:  Procedure Laterality Date   CARDIAC CATHETERIZATION     16 years ago negative    HERNIA REPAIR     right inguinal hernia surgery    I & D KNEE WITH POLY EXCHANGE  12/06/2011   Procedure: IRRIGATION AND DEBRIDEMENT KNEE WITH POLY EXCHANGE;  Surgeon: Shelda Pal, MD;  Location: [facility];  Service: Orthopedics;  Laterality: Right;  Right Total Knee Irrigation and Debridement with Poly Exchange   JOINT REPLACEMENT     left knee 5/12, right TKA 10/12    TOE AMPUTATION Right     Social History   Tobacco Use   Smoking status: Former    Current packs/day: 0.00    Types: Cigarettes    Quit date: 11/16/1963    Years since quitting: 60.1   Smokeless tobacco: Never  Vaping Use   Vaping status: Never Used  Substance Use Topics   Alcohol use: Yes    Alcohol/week: 2.0 standard drinks of alcohol    Types: 2 Cans of beer per week   Drug use: No  ALLERGIES:  is allergic to niacin, niacin and related, duloxetine hcl, and simvastatin.  MEDICATIONS:  Current Outpatient Medications  Medication Sig Dispense Refill   acetaminophen (TYLENOL) 500 MG tablet Take 1,000 mg by mouth every 4 (four) hours as needed for moderate pain.     apixaban (ELIQUIS) 5 MG TABS tablet Take 1 tablet (5 mg total) by mouth 2 (two) times daily. 60 tablet 11   apixaban (ELIQUIS) 5 MG TABS tablet Take 1 tablet (5 mg total) by mouth 2 (two) times daily. (Patient not taking: Reported on 07/25/2023) 28 tablet 0   atorvastatin (LIPITOR) 40 MG tablet Take 40 mg by  mouth every morning.     baclofen (LIORESAL) 10 MG tablet Take 10 mg by mouth 2 (two) times daily.  0   Cholecalciferol (VITAMIN D-3) 1000 UNITS CAPS Take 2,000 Units by mouth daily.     clotrimazole-betamethasone (LOTRISONE) cream Apply 1 Application topically 2 (two) times daily as needed (itching). (Patient not taking: Reported on 07/25/2023)     gabapentin (NEURONTIN) 300 MG capsule Take 300 mg by mouth 3 (three) times daily. (Patient not taking: Reported on 06/14/2023)     gabapentin (NEURONTIN) 300 MG capsule Take 900 mg by mouth 3 (three) times daily.     hydrochlorothiazide (HYDRODIURIL) 12.5 MG tablet Take 12.5 mg by mouth every morning.     HYDROcodone-acetaminophen (NORCO) 10-325 MG tablet Take 1 tablet by mouth 4 (four) times daily as needed. (Patient not taking: Reported on 07/25/2023)     hydroxyurea (HYDREA) 500 MG capsule Take 2 capsules (1,000 mg total) by mouth daily on Monday/Tuesday/Wednesday/Thursday/Friday and 1 cap (500mg ) po daily on Saturday and sunday. May take with food to minimize GI side effects. 180 capsule 1   Multiple Vitamin (MULITIVITAMIN WITH MINERALS) TABS Take 2 tablets by mouth daily.      Omega-3 Fatty Acids (SEA-OMEGA 30 PO) Take 1 capsule by mouth daily.     oxyCODONE-acetaminophen (PERCOCET) 10-325 MG tablet Take 1 tablet by mouth every 4 (four) hours as needed. (Patient not taking: Reported on 07/25/2023)     oxyCODONE-acetaminophen (PERCOCET) 7.5-325 MG tablet Take 1 tablet by mouth every 6 (six) hours.     No current facility-administered medications for this visit.    PHYSICAL EXAMINATION: Telemedicine visit  LABORATORY DATA:   I have reviewed the data as listed  .    Latest Ref Rng & Units 12/19/2023    7:29 AM 09/27/2023    7:18 AM 06/02/2023    7:37 AM  CBC  WBC 4.0 - 10.5 K/uL 5.1  4.2  4.5   Hemoglobin 13.0 - 17.0 g/dL 95.6  21.3  08.6   Hematocrit 39.0 - 52.0 % 44.3  40.4  42.6   Platelets 150 - 400 K/uL 259  233  252       Latest Ref Rng  & Units 12/19/2023    7:29 AM 09/27/2023    7:18 AM 07/25/2023    9:22 AM  CMP  Glucose 70 - 99 mg/dL 89  75  92   BUN 8 - 23 mg/dL 19  17  17    Creatinine 0.61 - 1.24 mg/dL 5.78  4.69  6.29   Sodium 135 - 145 mmol/L 141  142  141   Potassium 3.5 - 5.1 mmol/L 4.8  4.8  4.9   Chloride 98 - 111 mmol/L 104  104  99   CO2 22 - 32 mmol/L 32  33  28   Calcium 8.9 -  10.3 mg/dL 9.4  9.2  9.6   Total Protein 6.5 - 8.1 g/dL 7.6  7.1    Total Bilirubin 0.0 - 1.2 mg/dL 0.8  0.6    Alkaline Phos 38 - 126 U/L 64  74    AST 15 - 41 U/L 30  26    ALT 0 - 44 U/L 23  17        RADIOGRAPHIC STUDIES:  I have personally reviewed the radiological images as listed and agreed with the findings in the report.  [place] abd 03/15/2017: IMPRESSION: 1. Liver is echogenic consistent with fatty infiltration and/or hepatocellular disease. No focal hepatic abnormality. No gallstones or biliary distention.   2. Spleen is enlarged at 14.4 cm with a volume of 620 cc.    Electronically Signed   By: Maisie Fus  Register   On: 03/15/2017 09:15   ASSESSMENT & PLAN: '  77 y.o.  Caucasian male with   #1  JAK2 positive essential thrombocytosis #2 Abnormal LFTs - AST and ALT have normalized on labs today.  Likely from fatty liver. -will continue to monitor.  PLAN:  -Discussed lab results from today, 12/19/2023 in detail with the patient. CBC stable. CMP stable.  -Patient has been tolerating his current dose of hydroxyurea well without any new or severe toxicities.  -Continue Hydroxyurea 1000 mg 3 days a week and 500 mg rest of the week.  -No indication for complications from his essential thrombocytosis at this time. -answered all of patient's question's in detail regarding medication intake  FOLLOW UP: RTC with Dr Candise Che with labs in 4 months  The total time spent in the appointment was 15 minutes* .  All of the patient's questions were answered with apparent satisfaction. The patient knows to call the clinic with  any problems, questions or concerns.   Wyvonnia Lora MD MS AAHIVMS [facility] Hematology/Oncology Physician [facility]  .*Total Encounter Time as defined by the Centers for Medicare and Medicaid Services includes, in addition to the face-to-face time of a patient visit (documented in the note above) non-face-to-face time: obtaining and reviewing outside history, ordering and reviewing medications, tests or procedures, care coordination (communications with other health care professionals or caregivers) and documentation in the medical record.   I,Param Shah,acting as a Neurosurgeon for Wyvonnia Lora, MD.,have documented all relevant documentation on the behalf of Wyvonnia Lora, MD,as directed by  Wyvonnia Lora, MD while in the presence of Wyvonnia Lora, MD.  .I have reviewed the above documentation for accuracy and completeness, and I agree with the above. Johney Maine MD

## 2024-01-24 DIAGNOSIS — M47817 Spondylosis without myelopathy or radiculopathy, lumbosacral region: Secondary | ICD-10-CM | POA: Diagnosis not present

## 2024-01-24 DIAGNOSIS — G894 Chronic pain syndrome: Secondary | ICD-10-CM | POA: Diagnosis not present

## 2024-01-24 DIAGNOSIS — M62830 Muscle spasm of back: Secondary | ICD-10-CM | POA: Diagnosis not present

## 2024-01-24 DIAGNOSIS — M150 Primary generalized (osteo)arthritis: Secondary | ICD-10-CM | POA: Diagnosis not present

## 2024-01-24 DIAGNOSIS — Z79891 Long term (current) use of opiate analgesic: Secondary | ICD-10-CM | POA: Diagnosis not present

## 2024-02-23 ENCOUNTER — Other Ambulatory Visit: Payer: Self-pay | Admitting: Hematology

## 2024-02-23 DIAGNOSIS — D473 Essential (hemorrhagic) thrombocythemia: Secondary | ICD-10-CM

## 2024-03-19 ENCOUNTER — Other Ambulatory Visit: Payer: Self-pay

## 2024-03-19 DIAGNOSIS — D473 Essential (hemorrhagic) thrombocythemia: Secondary | ICD-10-CM

## 2024-03-20 DIAGNOSIS — G894 Chronic pain syndrome: Secondary | ICD-10-CM | POA: Diagnosis not present

## 2024-03-20 DIAGNOSIS — M150 Primary generalized (osteo)arthritis: Secondary | ICD-10-CM | POA: Diagnosis not present

## 2024-03-20 DIAGNOSIS — M62830 Muscle spasm of back: Secondary | ICD-10-CM | POA: Diagnosis not present

## 2024-03-20 DIAGNOSIS — M47817 Spondylosis without myelopathy or radiculopathy, lumbosacral region: Secondary | ICD-10-CM | POA: Diagnosis not present

## 2024-03-25 NOTE — Progress Notes (Signed)
 HEMATOLOGY ONCOLOGY PROGRESS NOTE  Date of service:  03/26/2024  Patient Care Team: Ozell Blunt, MD as PCP - General (Internal Medicine) [place], Peter M, MD as PCP - Cardiology (Cardiology)  CC Follow-up for continued evaluation and management of essential thrombocythemia.  Diagnosis:  JAK2 Essential thrombocytosis   Current Treatment: Hydroxyurea  1000 mg 3 times a week and 500 mg rest of the week.   INTERVAL HISTORY:  Jordan Vazquez is a 78 y.o. male who is connected via phone for continued evaluation and management of his JAK2 positive essential thrombocytosis.   Patient was last seen by me on 12/26/2023 and was doing well overall with no new medical complaints.  Notes no acute new symptoms since his last visit.  Continues to have chronic back pains and radiculopathy for which he follows with pain management.  Does follow with dermatology for his basal cell skin cancer. Continues to follow with cardiology for management of his atrial fibrillation. He notes no issues with tolerating his current dose of hydroxyurea . No infection issues. No concerns regarding VTE or on the thrombosis.  REVIEW OF SYSTEMS:    10 Point review of Systems was done is negative except as noted above.   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    Hyperlipidemia    Hypertension    Peripheral neuropathy     Past Surgical History:  Procedure Laterality Date   CARDIAC CATHETERIZATION     16 years ago negative    HERNIA REPAIR     right inguinal hernia surgery    I & D KNEE WITH POLY EXCHANGE  12/06/2011   Procedure: IRRIGATION AND DEBRIDEMENT KNEE WITH POLY EXCHANGE;  Surgeon: Bevin Bucks, MD;  Location: [facility];  Service: Orthopedics;  Laterality: Right;  Right Total Knee Irrigation and Debridement with Poly Exchange    JOINT REPLACEMENT     left knee 5/12, right TKA 10/12    TOE AMPUTATION Right     Social History   Tobacco Use   Smoking status: Former    Current packs/day: 0.00    Types: Cigarettes    Quit date: 11/16/1963    Years since quitting: 60.3   Smokeless tobacco: Never  Vaping Use   Vaping status: Never Used  Substance Use Topics   Alcohol use: Yes    Alcohol/week: 2.0 standard drinks of alcohol    Types: 2 Cans of beer per week   Drug use: No    ALLERGIES:  is allergic to niacin, niacin and related, duloxetine  hcl, and simvastatin.  MEDICATIONS:  Current Outpatient Medications  Medication Sig Dispense Refill   acetaminophen  (TYLENOL ) 500 MG tablet Take 1,000 mg by mouth every 4 (four) hours as needed for moderate pain.     apixaban  (ELIQUIS ) 5 MG TABS tablet Take 1 tablet (5 mg total) by mouth 2 (two) times daily. 60 tablet 11   apixaban  (ELIQUIS ) 5 MG TABS tablet Take 1 tablet (5 mg total) by mouth 2 (two) times daily. (Patient not taking: Reported on 07/25/2023) 28 tablet 0   atorvastatin  (LIPITOR) 40 MG tablet Take 40 mg by mouth every morning.     baclofen  (LIORESAL ) 10 MG tablet Take 10 mg by mouth 2 (two) times daily.  0   Cholecalciferol (VITAMIN D-3) 1000 UNITS CAPS Take 2,000 Units by mouth daily.  clotrimazole -betamethasone  (LOTRISONE ) cream Apply 1 Application topically 2 (two) times daily as needed (itching). (Patient not taking: Reported on 07/25/2023)     gabapentin  (NEURONTIN ) 300 MG capsule Take 300 mg by mouth 3 (three) times daily. (Patient not taking: Reported on 06/14/2023)     gabapentin  (NEURONTIN ) 300 MG capsule Take 900 mg by mouth 3 (three) times daily.     hydrochlorothiazide (HYDRODIURIL) 12.5 MG tablet Take 12.5 mg by mouth every morning.     HYDROcodone -acetaminophen  (NORCO) 10-325 MG tablet Take 1 tablet by mouth 4 (four) times daily as needed. (Patient not taking: Reported on 07/25/2023)     hydroxyurea  (HYDREA ) 500 MG capsule Take 2 capsules (1,000 mg  total) by mouth daily on Monday/Tuesday/Wednesday/Thursday/Friday and 1 cap (500mg ) po daily on Saturday and sunday. May take with food to minimize GI side effects. 180 capsule 1   Multiple Vitamin (MULITIVITAMIN WITH MINERALS) TABS Take 2 tablets by mouth daily.      Omega-3 Fatty Acids (SEA-OMEGA 30 PO) Take 1 capsule by mouth daily.     oxyCODONE -acetaminophen  (PERCOCET) 10-325 MG tablet Take 1 tablet by mouth every 4 (four) hours as needed. (Patient not taking: Reported on 07/25/2023)     oxyCODONE -acetaminophen  (PERCOCET) 7.5-325 MG tablet Take 1 tablet by mouth every 6 (six) hours.     No current facility-administered medications for this visit.    PHYSICAL EXAMINATION: .BP (!) 162/77   Pulse 65   Temp 98.1 F (36.7 C)   Resp 18   Wt 201 lb 14.4 oz (91.6 kg)   SpO2 99%   BMI 28.97 kg/m  GENERAL:alert, in no acute distress and comfortable SKIN: no acute rashes, no significant lesions EYES: conjunctiva are pink and non-injected, sclera anicteric OROPHARYNX: MMM, no exudates, no oropharyngeal erythema or ulceration NECK: supple, no JVD LYMPH:  no palpable lymphadenopathy in the cervical, axillary or inguinal regions LUNGS: clear to auscultation b/l with normal respiratory effort HEART: regular rate & rhythm ABDOMEN:  normoactive bowel sounds , non tender, not distended. Extremity: no pedal edema PSYCH: alert & oriented x 3 with fluent speech NEURO: no focal motor/sensory deficits   LABORATORY DATA:   I have reviewed the data as listed  .    Latest Ref Rng & Units 03/26/2024    7:15 AM 12/19/2023    7:29 AM 09/27/2023    7:18 AM  CBC  WBC 4.0 - 10.5 K/uL 4.8  5.1  4.2   Hemoglobin 13.0 - 17.0 g/dL 16.1  09.6  04.5   Hematocrit 39.0 - 52.0 % 40.9  44.3  40.4   Platelets 150 - 400 K/uL 258  259  233       Latest Ref Rng & Units 03/26/2024    7:15 AM 12/19/2023    7:29 AM 09/27/2023    7:18 AM  CMP  Glucose 70 - 99 mg/dL 78  89  75   BUN 8 - 23 mg/dL 17  19  17     Creatinine 0.61 - 1.24 mg/dL 4.09  8.11  9.14   Sodium 135 - 145 mmol/L 140  141  142   Potassium 3.5 - 5.1 mmol/L 4.5  4.8  4.8   Chloride 98 - 111 mmol/L 104  104  104   CO2 22 - 32 mmol/L 32  32  33   Calcium  8.9 - 10.3 mg/dL 9.0  9.4  9.2   Total Protein 6.5 - 8.1 g/dL 7.4  7.6  7.1   Total Bilirubin 0.0 -  1.2 mg/dL 0.7  0.8  0.6   Alkaline Phos 38 - 126 U/L 80  64  74   AST 15 - 41 U/L 28  30  26    ALT 0 - 44 U/L 18  23  17        RADIOGRAPHIC STUDIES:  I have personally reviewed the radiological images as listed and agreed with the findings in the report.  US  abd 03/15/2017: IMPRESSION: 1. Liver is echogenic consistent with fatty infiltration and/or hepatocellular disease. No focal hepatic abnormality. No gallstones or biliary distention.   2. Spleen is enlarged at 14.4 cm with a volume of 620 cc.    Electronically Signed   By: Andy Bannister  Register   On: 03/15/2017 09:15   ASSESSMENT & PLAN: '  78 y.o.  Caucasian male with   #1  JAK2 positive essential thrombocytosis #2 Abnormal LFTs - AST and ALT have normalized on labs today.  Likely from fatty liver. -will continue to monitor. #3 basal cell skin cancer follows with dermatology #4 atrial fibrillation on anticoagulation follows with cardiology #5. 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  - CBC within normal limits with a WBC count of 4.8k hemoglobin of  14.8 and platelets of 258k -CMP stable with normal liver function tests and creatinine -Patient's platelets are at goal. - She notes no notable toxicities from his current dose of hydroxyurea  -Will reduce his hydroxyurea  to 2 capsules i.e. 1000 mg 4 days a week from Monday through Thursday and 1 capsule I.e. 500 mg on Friday Saturday and Sunday. - Continue Eliquis  for atrial fibrillation and DVT prophylaxis with no issues with bleeding -Continue multivitamin and vitamin D - continue follow-up with dermatology for skin screening - Discussed importance of sun protection while being on hydroxyurea  and with history of skin conditions.  FOLLOW UP:  Phone Visit with Dr Salomon Cree in 4 months Labs 1-2 days prior to phone visit   The total time spent in the appointment was 20 minutes* .  All of the patient's questions were answered with apparent satisfaction. The patient knows to call the clinic with any problems, questions or concerns.   Jacquelyn Matt MD MS AAHIVMS [facility] Hematology/Oncology Physician [facility]  .*Total Encounter Time as defined by the Centers for Medicare and Medicaid Services includes, in addition to the face-to-face time of a patient visit (documented in the note above) non-face-to-face time: obtaining and reviewing outside history, ordering and reviewing medications, tests or procedures, care coordination (communications with other health care professionals or caregivers) and documentation in the medical record.    I,Mitra Faeizi,acting as a Neurosurgeon for Jacquelyn Matt, MD.,have documented all relevant documentation on the behalf of Jacquelyn Matt, MD,as directed by  Jacquelyn Matt, MD while in the presence of Jacquelyn Matt, MD.  .I have reviewed the above documentation for accuracy and completeness, and I agree with the above. .Gracemarie Skeet Kishore Navneet Schmuck MD

## 2024-03-26 ENCOUNTER — Inpatient Hospital Stay: Payer: Medicare Other | Attending: Hematology | Admitting: Hematology

## 2024-03-26 ENCOUNTER — Inpatient Hospital Stay: Payer: Medicare Other

## 2024-03-26 VITALS — BP 162/77 | HR 65 | Temp 98.1°F | Resp 18 | Wt 201.9 lb

## 2024-03-26 DIAGNOSIS — Z7901 Long term (current) use of anticoagulants: Secondary | ICD-10-CM | POA: Diagnosis not present

## 2024-03-26 DIAGNOSIS — Z87891 Personal history of nicotine dependence: Secondary | ICD-10-CM | POA: Insufficient documentation

## 2024-03-26 DIAGNOSIS — D75839 Thrombocytosis, unspecified: Secondary | ICD-10-CM | POA: Insufficient documentation

## 2024-03-26 DIAGNOSIS — Z79899 Other long term (current) drug therapy: Secondary | ICD-10-CM | POA: Diagnosis not present

## 2024-03-26 DIAGNOSIS — Z7964 Long term (current) use of myelosuppressive agent: Secondary | ICD-10-CM | POA: Diagnosis not present

## 2024-03-26 DIAGNOSIS — D473 Essential (hemorrhagic) thrombocythemia: Secondary | ICD-10-CM

## 2024-03-26 DIAGNOSIS — M549 Dorsalgia, unspecified: Secondary | ICD-10-CM | POA: Insufficient documentation

## 2024-03-26 DIAGNOSIS — R7989 Other specified abnormal findings of blood chemistry: Secondary | ICD-10-CM | POA: Insufficient documentation

## 2024-03-26 DIAGNOSIS — Z888 Allergy status to other drugs, medicaments and biological substances status: Secondary | ICD-10-CM | POA: Insufficient documentation

## 2024-03-26 LAB — CMP (CANCER CENTER ONLY)
ALT: 18 U/L (ref 0–44)
AST: 28 U/L (ref 15–41)
Albumin: 4.6 g/dL (ref 3.5–5.0)
Alkaline Phosphatase: 80 U/L (ref 38–126)
Anion gap: 4 — ABNORMAL LOW (ref 5–15)
BUN: 17 mg/dL (ref 8–23)
CO2: 32 mmol/L (ref 22–32)
Calcium: 9 mg/dL (ref 8.9–10.3)
Chloride: 104 mmol/L (ref 98–111)
Creatinine: 0.88 mg/dL (ref 0.61–1.24)
GFR, Estimated: >60 mL/min (ref >60)
Glucose, Bld: 78 mg/dL (ref 70–99)
Potassium: 4.5 mmol/L (ref 3.5–5.1)
Sodium: 140 mmol/L (ref 135–145)
Total Bilirubin: 0.7 mg/dL (ref 0.0–1.2)
Total Protein: 7.4 g/dL (ref 6.5–8.1)

## 2024-03-26 LAB — CBC WITH DIFFERENTIAL (CANCER CENTER ONLY)
Abs Immature Granulocytes: 0.01 10*3/uL (ref 0.00–0.07)
Basophils Absolute: 0 10*3/uL (ref 0.0–0.1)
Basophils Relative: 1 %
Eosinophils Absolute: 0.1 10*3/uL (ref 0.0–0.5)
Eosinophils Relative: 3 %
HCT: 40.9 % (ref 39.0–52.0)
Hemoglobin: 14.8 g/dL (ref 13.0–17.0)
Immature Granulocytes: 0 %
Lymphocytes Relative: 23 %
Lymphs Abs: 1.1 10*3/uL (ref 0.7–4.0)
MCH: 38.9 pg — ABNORMAL HIGH (ref 26.0–34.0)
MCHC: 36.2 g/dL — ABNORMAL HIGH (ref 30.0–36.0)
MCV: 107.6 fL — ABNORMAL HIGH (ref 80.0–100.0)
Monocytes Absolute: 0.8 10*3/uL (ref 0.1–1.0)
Monocytes Relative: 17 %
Neutro Abs: 2.8 10*3/uL (ref 1.7–7.7)
Neutrophils Relative %: 56 %
Platelet Count: 258 10*3/uL (ref 150–400)
RBC: 3.8 MIL/uL — ABNORMAL LOW (ref 4.22–5.81)
RDW: 13.3 % (ref 11.5–15.5)
WBC Count: 4.8 10*3/uL (ref 4.0–10.5)
nRBC: 0 % (ref 0.0–0.2)

## 2024-03-26 MED ORDER — HYDROXYUREA 500 MG PO CAPS: ORAL_CAPSULE | ORAL | 1 refills | Status: DC

## 2024-04-11 ENCOUNTER — Other Ambulatory Visit: Payer: Self-pay | Admitting: Internal Medicine

## 2024-04-11 DIAGNOSIS — K4090 Unilateral inguinal hernia, without obstruction or gangrene, not specified as recurrent: Secondary | ICD-10-CM

## 2024-04-11 DIAGNOSIS — I10 Essential (primary) hypertension: Secondary | ICD-10-CM | POA: Diagnosis not present

## 2024-04-11 DIAGNOSIS — D473 Essential (hemorrhagic) thrombocythemia: Secondary | ICD-10-CM | POA: Diagnosis not present

## 2024-04-11 DIAGNOSIS — M48062 Spinal stenosis, lumbar region with neurogenic claudication: Secondary | ICD-10-CM | POA: Diagnosis not present

## 2024-04-11 DIAGNOSIS — Z23 Encounter for immunization: Secondary | ICD-10-CM | POA: Diagnosis not present

## 2024-04-19 ENCOUNTER — Ambulatory Visit
Admission: RE | Admit: 2024-04-19 | Discharge: 2024-04-19 | Disposition: A | Discharge status: Home / Self Care | Source: Ambulatory Visit | Attending: Internal Medicine | Admitting: Internal Medicine

## 2024-04-19 DIAGNOSIS — K4090 Unilateral inguinal hernia, without obstruction or gangrene, not specified as recurrent: Secondary | ICD-10-CM

## 2024-04-30 ENCOUNTER — Telehealth: Payer: Self-pay

## 2024-04-30 ENCOUNTER — Ambulatory Visit: Payer: Self-pay | Admitting: General Surgery

## 2024-04-30 DIAGNOSIS — K4090 Unilateral inguinal hernia, without obstruction or gangrene, not specified as recurrent: Secondary | ICD-10-CM | POA: Diagnosis not present

## 2024-04-30 NOTE — Telephone Encounter (Signed)
   Pre-operative Risk Assessment    Patient Name: REDFORD BEHRLE  DOB: 10/22/1946 MRN: [id number]   Date of last office visit: 07/25/23 Lawana Pray, NP Date of next office visit: NONE   Request for Surgical Clearance    Procedure:  HERNIA SURGERY  Date of Surgery:  Clearance TBD                                Surgeon:  Caralyn Chandler, MD Surgeon's Group or Practice Name:  [facility] Phone number:  [phone] Fax number:  [phone]  ATTN: Eladio Graven, CMA   Type of Clearance Requested:   - Medical  - Pharmacy:  Hold Apixaban  (Eliquis )     Type of Anesthesia:  General    Additional requests/questions:    Signed, Collin Deal   04/30/2024, 11:54 AM

## 2024-05-01 ENCOUNTER — Telehealth: Payer: Self-pay

## 2024-05-01 NOTE — Telephone Encounter (Signed)
Please advise holding Eliquis prior to hernia repair.   Thank you!  DW

## 2024-05-01 NOTE — Telephone Encounter (Signed)
 Pts returning call to schedule tele visit.

## 2024-05-01 NOTE — Telephone Encounter (Signed)
I left a message for the patient to call our office to schedule a tele visit for pre-op 

## 2024-05-01 NOTE — Telephone Encounter (Signed)
   Name: Jordan Vazquez  DOB: Jan 23, 1946  MRN: [id number]  Primary Cardiologist: Peter Swazil[place], MD   Preoperative team, please contact this patient and set up a phone call appointment for further preoperative risk assessment. Please obtain consent and complete medication review. Thank you for your help.  I confirm that guidance regarding antiplatelet and oral anticoagulation therapy has been completed and, if necessary, noted below.  Per Pharm D, patient has not had an Afib/aflutter ablation within the last 3 months or DCCV within the last 30 days. Patient may hold Eliquis  for 2 days prior to procedure.  Patient will not need bridging with Lovenox  around procedure.    I also confirmed the patient resides in the state of [place] . As per [facility] Medical Board telemedicine laws, the patient must reside in the state in which the provider is licensed.   Morey Ar, NP 05/01/2024, 1:14 PM [facility]

## 2024-05-01 NOTE — Telephone Encounter (Signed)
  Patient Consent for Virtual Visit        Jordan Vazquez has provided verbal consent on 05/01/2024 for a virtual visit (video or telephone).   CONSENT FOR VIRTUAL VISIT FOR:  Jordan Vazquez  By participating in this virtual visit I agree to the following:  I hereby voluntarily request, consent and authorize [facility] and its employed or contracted physicians, physician assistants, nurse practitioners or other licensed health care professionals (the Practitioner), to provide me with telemedicine health care services (the "Services) as deemed necessary by the treating Practitioner. I acknowledge and consent to receive the Services by the Practitioner via telemedicine. I understand that the telemedicine visit will involve communicating with the Practitioner through live audiovisual communication technology and the disclosure of certain medical information by electronic transmission. I acknowledge that I have been given the opportunity to request an in-person assessment or other available alternative prior to the telemedicine visit and am voluntarily participating in the telemedicine visit.  I understand that I have the right to withhold or withdraw my consent to the use of telemedicine in the course of my care at any time, without affecting my right to future care or treatment, and that the Practitioner or I may terminate the telemedicine visit at any time. I understand that I have the right to inspect all information obtained and/or recorded in the course of the telemedicine visit and may receive copies of available information for a reasonable fee.  I understand that some of the potential risks of receiving the Services via telemedicine include:  Delay or interruption in medical evaluation due to technological equipment failure or disruption; Information transmitted may not be sufficient (e.g. poor resolution of images) to allow for appropriate medical decision making by the Practitioner;  and/or  In rare instances, security protocols could fail, causing a breach of personal health information.  Furthermore, I acknowledge that it is my responsibility to provide information about my medical history, conditions and care that is complete and accurate to the best of my ability. I acknowledge that Practitioner's advice, recommendations, and/or decision may be based on factors not within their control, such as incomplete or inaccurate data provided by me or distortions of diagnostic images or specimens that may result from electronic transmissions. I understand that the practice of medicine is not an exact science and that Practitioner makes no warranties or guarantees regarding treatment outcomes. I acknowledge that a copy of this consent can be made available to me via my patient portal [facility] MyChart), or I can request a printed copy by calling the office of [facility].    I understand that my insurance will be billed for this visit.   I have read or had this consent read to me. I understand the contents of this consent, which adequately explains the benefits and risks of the Services being provided via telemedicine.  I have been provided ample opportunity to ask questions regarding this consent and the Services and have had my questions answered to my satisfaction. I give my informed consent for the services to be provided through the use of telemedicine in my medical care

## 2024-05-01 NOTE — Telephone Encounter (Signed)
 Patient with diagnosis of afib on Eliquis  for anticoagulation.    Procedure: HERNIA SURGERY  Date of procedure: TBD   CHA2DS2-VASc Score = 3   This indicates a 3.2% annual risk of stroke. The patient's score is based upon: CHF History: 0 HTN History: 1 Diabetes History: 0 Stroke History: 0 Vascular Disease History: 0 Age Score: 2 Gender Score: 0     CrCl 79 mL/min Platelet count 258  Patient has not had an Afib/aflutter ablation within the last 3 months or DCCV within the last 30 days   Per office protocol, patient can hold Eliquis  for 2 days prior to procedure.   Patient will not need bridging with Lovenox  (enoxaparin ) around procedure.  **This guidance is not considered finalized until pre-operative APP has relayed final recommendations.**

## 2024-05-07 ENCOUNTER — Other Ambulatory Visit: Payer: Self-pay | Admitting: Cardiology

## 2024-05-07 NOTE — Telephone Encounter (Signed)
 Prescription refill request for Eliquis  received. Indication:afib Last office visit:9/24 Scr:0.88  5/25 Age: 78 Weight:91.6  kg  Prescription refilled

## 2024-05-07 NOTE — Progress Notes (Unsigned)
 Virtual Visit via Telephone Note   Because of Jordan Vazquez co-morbid illnesses, he is at least at moderate risk for complications without adequate follow up.  This format is felt to be most appropriate for this patient at this time.  Due to technical limitations with video connection (technology), today's appointment will be conducted as an audio only telehealth visit, and [name] verbally agreed to proceed in this manner.   All issues noted in this document were discussed and addressed.  No physical exam could be performed with this format.  Evaluation Performed:  Preoperative cardiovascular risk assessment _____________   Date:  05/07/2024   Patient ID:  Jordan Vazquez, DOB 1946/09/08, MRN [id number] Patient Location:  Home Provider location:   Office  Primary Care Provider:  Cicero Aureliano SAUNDERS, MD Primary Cardiologist:  Peter [place]azil[place], MD  Chief Complaint / Patient Profile   78 y.o. y/o male with a h/o HTN, dehydration, neuropathy, atrial fibrillation, who is pending hernia surgery and presents today for telephonic preoperative cardiovascular risk assessment.  History of Present Illness    Jordan Vazquez is a 78 y.o. male who presents via audio/video conferencing for a telehealth visit today.  Pt was last seen in cardiology clinic on 07/25/2023 by Josefa Beauvais, NP-C.  At that time [name] was doing well .  The patient is now pending procedure as outlined above. Since his last visit, he continues to be stable from a cardiac standpoint.  Today he denies chest pain, shortness of breath, lower extremity edema, fatigue, palpitations, melena, hematuria, hemoptysis, diaphoresis, weakness, presyncope, syncope, orthopnea, and PND.   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     Hyperlipidemia    Hypertension    Peripheral neuropathy    Past Surgical History:  Procedure Laterality Date   CARDIAC CATHETERIZATION     16 years ago negative    HERNIA REPAIR     right inguinal hernia surgery    I & D KNEE WITH POLY EXCHANGE  12/06/2011   Procedure: IRRIGATION AND DEBRIDEMENT KNEE WITH POLY EXCHANGE;  Surgeon: [name], MD;  Location: [facility];  Service: Orthopedics;  Laterality: Right;  Right Total Knee Irrigation and Debridement with Poly Exchange   JOINT REPLACEMENT     left knee 5/12, right TKA 10/12    TOE AMPUTATION Right     Allergies  Allergies  Allergen Reactions   Niacin     Other reaction(s): burning all over feeling   Niacin And Related Itching   Duloxetine  Hcl Rash   Simvastatin Rash    Home Medications    Prior to Admission medications   Medication Sig Start Date End Date Taking? Authorizing Provider  acetaminophen  (TYLENOL ) 500 MG tablet Take 1,000 mg by mouth every 4 (four) hours as needed for moderate pain.    [provider]  apixaban  (ELIQUIS ) 5 MG TABS tablet Take 1 tablet (5 mg total) by mouth 2 (two) times daily. 06/14/23   [place], Peter M, MD  apixaban  (ELIQUIS ) 5 MG TABS tablet Take 1 tablet (5 mg total) by mouth 2 (two) times daily. Patient not taking: Reported on 05/01/2024 06/14/23   [place], Peter M, MD  atorvastatin  (LIPITOR) 40 MG tablet Take 40 mg by mouth every morning.    [provider]  baclofen  (LIORESAL ) 10 MG tablet Take 10 mg by mouth 2 (two) times daily. 03/21/17   [provider]  Cholecalciferol (VITAMIN D-3) 1000 UNITS CAPS Take 2,000 Units by mouth daily.    [provider]  clotrimazole -betamethasone  (LOTRISONE ) cream Apply 1 Application topically 2 (two) times daily as needed (itching). 04/13/22   [provider]  gabapentin  (NEURONTIN ) 300 MG capsule Take 300 mg by mouth 3 (three) times daily. 09/16/20   [provider]  gabapentin  (NEURONTIN ) 300 MG capsule  Take 900 mg by mouth 3 (three) times daily. Patient not taking: Reported on 05/01/2024    [provider]  hydrochlorothiazide (HYDRODIURIL) 12.5 MG tablet Take 12.5 mg by mouth every morning. 09/08/20   [provider]  hydroxyurea  (HYDREA ) 500 MG capsule Take 2 capsules (1,000 mg total) by mouth daily on Monday/Tuesday/Wednesday/Thursday and 1 cap (500mg ) po daily on Friday, Saturday and sunday. May take with food to minimize GI side effects. 03/26/24   Kale, Gautam Kishore, MD  Multiple Vitamin (MULITIVITAMIN WITH MINERALS) TABS Take 2 tablets by mouth daily.     [provider]  Omega-3 Fatty Acids (SEA-OMEGA 30 PO) Take 1 capsule by mouth daily. Patient not taking: Reported on 05/01/2024    [provider]  oxyCODONE -acetaminophen  (PERCOCET) 10-325 MG tablet Take 1 tablet by mouth every 4 (four) hours as needed. 05/24/23   [provider]    Physical Exam    Vital Signs:  Jordan BRAVO Alderete does not have vital signs available for review today.  Given telephonic nature of communication, physical exam is limited. AAOx3. NAD. Normal affect.  Speech and respirations are unlabored.  Accessory Clinical Findings    None  Assessment & Plan    1.  Preoperative Cardiovascular Risk Assessment:Procedure:  HERNIA SURGERY   Date of Surgery:  Clearance TBD                                  Surgeon:  DEWARD CURVIN MOULD, MD Surgeon's Group or Practice Name:  [facility] Phone number:  [phone] Fax number:  [phone]  ATTN: ROSALINE SPRANG, CMA        Primary Cardiologist: Peter [place], MD  Chart reviewed as part of pre-operative protocol coverage. Given past medical history and time since last visit, based on ACC/AHA guidelines, Pauline Trainer Platner would be at acceptable risk for the planned procedure without further cardiovascular testing.   His RCRI is very low risk, 0.4% risk of major cardiac event.  He is able to complete greater than 4 METS  of physical activity.  Patient was advised that if he develops new symptoms prior to surgery to contact our office to arrange a follow-up appointment.  He verbalized understanding.  His Eliquis  may be held for 2 days prior to his procedure.  Please resume as soon as hemostasis is achieved.  I will route this recommendation to the requesting party via Epic fax function and remove from pre-op pool.       Time:   Today, I have spent 5 minutes with the patient with telehealth technology discussing medical history, symptoms, and management plan.  I spent 10 minutes reviewing patient's past cardiac history and cardiac medications.    Josefa CHRISTELLA Beauvais, NP  05/07/2024, 8:35 AM

## 2024-05-08 ENCOUNTER — Ambulatory Visit: Attending: Cardiovascular Disease

## 2024-05-08 DIAGNOSIS — Z01810 Encounter for preprocedural cardiovascular examination: Secondary | ICD-10-CM

## 2024-05-15 DIAGNOSIS — M62830 Muscle spasm of back: Secondary | ICD-10-CM | POA: Diagnosis not present

## 2024-05-15 DIAGNOSIS — M150 Primary generalized (osteo)arthritis: Secondary | ICD-10-CM | POA: Diagnosis not present

## 2024-05-15 DIAGNOSIS — G894 Chronic pain syndrome: Secondary | ICD-10-CM | POA: Diagnosis not present

## 2024-05-15 DIAGNOSIS — M47817 Spondylosis without myelopathy or radiculopathy, lumbosacral region: Secondary | ICD-10-CM | POA: Diagnosis not present

## 2024-05-15 NOTE — Telephone Encounter (Signed)
 Patient calling for a update to be sent to the dr office. Please advise

## 2024-05-15 NOTE — Telephone Encounter (Signed)
 Called patient to set up tele visit. Pt had already spoken with Albino Beauvais, NP on 05/08/24 and he already sent over clearance notes. Patient states he has not heard an update from the requesting office. Pre-op clearance notes have been resent to requesting office today.

## 2024-05-30 ENCOUNTER — Encounter (HOSPITAL_BASED_OUTPATIENT_CLINIC_OR_DEPARTMENT_OTHER): Payer: Self-pay | Admitting: General Surgery

## 2024-06-01 ENCOUNTER — Encounter (HOSPITAL_BASED_OUTPATIENT_CLINIC_OR_DEPARTMENT_OTHER)
Admission: RE | Admit: 2024-06-01 | Discharge: 2024-06-01 | Disposition: A | Discharge status: Home / Self Care | Source: Ambulatory Visit | Attending: General Surgery | Admitting: General Surgery

## 2024-06-01 DIAGNOSIS — Z01812 Encounter for preprocedural laboratory examination: Secondary | ICD-10-CM | POA: Diagnosis not present

## 2024-06-01 DIAGNOSIS — Z01818 Encounter for other preprocedural examination: Secondary | ICD-10-CM | POA: Diagnosis not present

## 2024-06-01 LAB — BASIC METABOLIC PANEL WITH GFR
Anion gap: 10 (ref 5–15)
BUN: 15 mg/dL (ref 8–23)
CO2: 25 mmol/L (ref 22–32)
Calcium: 9.3 mg/dL (ref 8.9–10.3)
Chloride: 105 mmol/L (ref 98–111)
Creatinine, Ser: 0.87 mg/dL (ref 0.61–1.24)
GFR, Estimated: >60 mL/min (ref >60)
Glucose, Bld: 87 mg/dL (ref 70–99)
Potassium: 4.5 mmol/L (ref 3.5–5.1)
Sodium: 140 mmol/L (ref 135–145)

## 2024-06-01 MED ORDER — CHLORHEXIDINE GLUCONATE CLOTH 2 % EX PADS: 6.0000 | MEDICATED_PAD | Freq: Once | CUTANEOUS | Status: DC

## 2024-06-01 NOTE — Progress Notes (Signed)

## 2024-06-06 ENCOUNTER — Other Ambulatory Visit: Payer: Self-pay

## 2024-06-06 ENCOUNTER — Ambulatory Visit (HOSPITAL_BASED_OUTPATIENT_CLINIC_OR_DEPARTMENT_OTHER): Admitting: Anesthesiology

## 2024-06-06 ENCOUNTER — Encounter (HOSPITAL_BASED_OUTPATIENT_CLINIC_OR_DEPARTMENT_OTHER)
Admission: RE | Disposition: A | Discharge status: Home / Self Care | Payer: Self-pay | Source: Home / Self Care | Attending: General Surgery

## 2024-06-06 ENCOUNTER — Encounter (HOSPITAL_BASED_OUTPATIENT_CLINIC_OR_DEPARTMENT_OTHER): Payer: Self-pay | Admitting: General Surgery

## 2024-06-06 ENCOUNTER — Ambulatory Visit (HOSPITAL_BASED_OUTPATIENT_CLINIC_OR_DEPARTMENT_OTHER)
Admission: RE | Admit: 2024-06-06 | Discharge: 2024-06-06 | Disposition: A | Discharge status: Home / Self Care | Attending: General Surgery | Admitting: General Surgery

## 2024-06-06 DIAGNOSIS — M1990 Unspecified osteoarthritis, unspecified site: Secondary | ICD-10-CM | POA: Insufficient documentation

## 2024-06-06 DIAGNOSIS — K4090 Unilateral inguinal hernia, without obstruction or gangrene, not specified as recurrent: Secondary | ICD-10-CM | POA: Diagnosis not present

## 2024-06-06 DIAGNOSIS — I4891 Unspecified atrial fibrillation: Secondary | ICD-10-CM | POA: Diagnosis not present

## 2024-06-06 DIAGNOSIS — Z79899 Other long term (current) drug therapy: Secondary | ICD-10-CM | POA: Diagnosis not present

## 2024-06-06 DIAGNOSIS — I10 Essential (primary) hypertension: Secondary | ICD-10-CM

## 2024-06-06 DIAGNOSIS — Z87891 Personal history of nicotine dependence: Secondary | ICD-10-CM | POA: Diagnosis not present

## 2024-06-06 DIAGNOSIS — G8918 Other acute postprocedural pain: Secondary | ICD-10-CM | POA: Diagnosis not present

## 2024-06-06 DIAGNOSIS — Z01818 Encounter for other preprocedural examination: Secondary | ICD-10-CM

## 2024-06-06 DIAGNOSIS — Z7901 Long term (current) use of anticoagulants: Secondary | ICD-10-CM | POA: Diagnosis not present

## 2024-06-06 HISTORY — PX: INGUINAL HERNIA REPAIR: SHX194

## 2024-06-06 SURGERY — REPAIR, HERNIA, INGUINAL, ADULT
Anesthesia: General | Site: Groin | Laterality: Left

## 2024-06-06 MED ORDER — HYDROMORPHONE HCL 1 MG/ML IJ SOLN
0.2500 mg | INTRAMUSCULAR | Status: DC | PRN
  Administered 2024-06-06: 0.5 mg via INTRAVENOUS

## 2024-06-06 MED ORDER — OXYCODONE HCL 5 MG PO TABS
ORAL_TABLET | ORAL | Status: AC
  Filled 2024-06-06: qty 1

## 2024-06-06 MED ORDER — SODIUM CHLORIDE 0.9 % IV SOLN: 12.5000 mg | INTRAVENOUS | Status: DC | PRN

## 2024-06-06 MED ORDER — MIDAZOLAM HCL 2 MG/2ML IJ SOLN
INTRAMUSCULAR | Status: AC
  Filled 2024-06-06: qty 2

## 2024-06-06 MED ORDER — GABAPENTIN 100 MG PO CAPS
ORAL_CAPSULE | ORAL | Status: AC
  Filled 2024-06-06: qty 1

## 2024-06-06 MED ORDER — DEXAMETHASONE SODIUM PHOSPHATE 10 MG/ML IJ SOLN
INTRAMUSCULAR | Status: AC
Start: 2024-06-06 — End: 2024-06-06
  Filled 2024-06-06: qty 1

## 2024-06-06 MED ORDER — ONDANSETRON HCL 4 MG/2ML IJ SOLN
INTRAMUSCULAR | Status: AC
Start: 2024-06-06 — End: 2024-06-06
  Filled 2024-06-06: qty 2

## 2024-06-06 MED ORDER — PROPOFOL 10 MG/ML IV BOLUS
INTRAVENOUS | Status: DC | PRN
  Administered 2024-06-06: 120 mg via INTRAVENOUS

## 2024-06-06 MED ORDER — PHENYLEPHRINE 80 MCG/ML (10ML) SYRINGE FOR IV PUSH (FOR BLOOD PRESSURE SUPPORT)
PREFILLED_SYRINGE | INTRAVENOUS | Status: DC | PRN
  Administered 2024-06-06 (×5): 80 ug via INTRAVENOUS

## 2024-06-06 MED ORDER — LACTATED RINGERS IV SOLN: INTRAVENOUS | Status: DC

## 2024-06-06 MED ORDER — ACETAMINOPHEN 500 MG PO TABS
1000.0000 mg | ORAL_TABLET | ORAL | Status: AC
  Administered 2024-06-06: 1000 mg via ORAL

## 2024-06-06 MED ORDER — OXYCODONE HCL 5 MG PO TABS
5.0000 mg | ORAL_TABLET | Freq: Once | ORAL | Status: AC | PRN
  Administered 2024-06-06: 5 mg via ORAL

## 2024-06-06 MED ORDER — SUGAMMADEX SODIUM 200 MG/2ML IV SOLN
INTRAVENOUS | Status: AC
  Filled 2024-06-06: qty 2

## 2024-06-06 MED ORDER — FENTANYL CITRATE (PF) 100 MCG/2ML IJ SOLN
INTRAMUSCULAR | Status: DC | PRN
  Administered 2024-06-06 (×2): 50 ug via INTRAVENOUS

## 2024-06-06 MED ORDER — 0.9 % SODIUM CHLORIDE (POUR BTL) OPTIME
TOPICAL | Status: DC | PRN
  Administered 2024-06-06: 1000 mL

## 2024-06-06 MED ORDER — FENTANYL CITRATE (PF) 100 MCG/2ML IJ SOLN
100.0000 ug | Freq: Once | INTRAMUSCULAR | Status: AC
  Administered 2024-06-06: 100 ug via INTRAVENOUS

## 2024-06-06 MED ORDER — CEFAZOLIN SODIUM-DEXTROSE 2-4 GM/100ML-% IV SOLN
2.0000 g | INTRAVENOUS | Status: AC
  Administered 2024-06-06: 2 g via INTRAVENOUS

## 2024-06-06 MED ORDER — ROCURONIUM 10MG/ML (10ML) SYRINGE FOR MEDFUSION PUMP - OPTIME
INTRAVENOUS | Status: DC | PRN
  Administered 2024-06-06: 60 mg via INTRAVENOUS

## 2024-06-06 MED ORDER — BUPIVACAINE-EPINEPHRINE 0.25% -1:200000 IJ SOLN
INTRAMUSCULAR | Status: DC | PRN
  Administered 2024-06-06: 14 mL

## 2024-06-06 MED ORDER — MIDAZOLAM HCL 2 MG/2ML IJ SOLN
2.0000 mg | Freq: Once | INTRAMUSCULAR | Status: AC
Start: 2024-06-06 — End: 2024-06-06
  Administered 2024-06-06: 1 mg via INTRAVENOUS

## 2024-06-06 MED ORDER — DEXAMETHASONE SODIUM PHOSPHATE 10 MG/ML IJ SOLN
INTRAMUSCULAR | Status: DC | PRN
  Administered 2024-06-06: 10 mg via INTRAVENOUS

## 2024-06-06 MED ORDER — EPHEDRINE 5 MG/ML INJ
INTRAVENOUS | Status: AC
  Filled 2024-06-06: qty 5

## 2024-06-06 MED ORDER — PHENYLEPHRINE 80 MCG/ML (10ML) SYRINGE FOR IV PUSH (FOR BLOOD PRESSURE SUPPORT)
PREFILLED_SYRINGE | INTRAVENOUS | Status: AC
  Filled 2024-06-06: qty 10

## 2024-06-06 MED ORDER — OXYCODONE HCL 5 MG/5ML PO SOLN: 5.0000 mg | Freq: Once | ORAL | Status: AC | PRN

## 2024-06-06 MED ORDER — FENTANYL CITRATE (PF) 100 MCG/2ML IJ SOLN
INTRAMUSCULAR | Status: AC
  Filled 2024-06-06: qty 2

## 2024-06-06 MED ORDER — LIDOCAINE 2% (20 MG/ML) 5 ML SYRINGE
INTRAMUSCULAR | Status: AC
  Filled 2024-06-06: qty 5

## 2024-06-06 MED ORDER — CEFAZOLIN SODIUM-DEXTROSE 2-4 GM/100ML-% IV SOLN
INTRAVENOUS | Status: AC
  Filled 2024-06-06: qty 100

## 2024-06-06 MED ORDER — LACTATED RINGERS IV SOLN: INTRAVENOUS | Status: DC | PRN

## 2024-06-06 MED ORDER — ACETAMINOPHEN 500 MG PO TABS
ORAL_TABLET | ORAL | Status: AC
  Filled 2024-06-06: qty 2

## 2024-06-06 MED ORDER — ONDANSETRON HCL 4 MG/2ML IJ SOLN
INTRAMUSCULAR | Status: DC | PRN
  Administered 2024-06-06: 4 mg via INTRAVENOUS

## 2024-06-06 MED ORDER — HYDROMORPHONE HCL 1 MG/ML IJ SOLN
INTRAMUSCULAR | Status: AC
Start: 2024-06-06 — End: 2024-06-06
  Filled 2024-06-06: qty 0.5

## 2024-06-06 MED ORDER — ROCURONIUM BROMIDE 10 MG/ML (PF) SYRINGE
PREFILLED_SYRINGE | INTRAVENOUS | Status: AC
  Filled 2024-06-06: qty 10

## 2024-06-06 MED ORDER — EPHEDRINE SULFATE-NACL 50-0.9 MG/10ML-% IV SOSY
PREFILLED_SYRINGE | INTRAVENOUS | Status: DC | PRN
  Administered 2024-06-06 (×4): 5 mg via INTRAVENOUS

## 2024-06-06 MED ORDER — SUGAMMADEX SODIUM 200 MG/2ML IV SOLN
INTRAVENOUS | Status: DC | PRN
  Administered 2024-06-06: 200 mg via INTRAVENOUS

## 2024-06-06 MED ORDER — GABAPENTIN 100 MG PO CAPS: 100.0000 mg | ORAL_CAPSULE | ORAL | Status: DC

## 2024-06-06 MED ORDER — ROPIVACAINE HCL 5 MG/ML IJ SOLN
INTRAMUSCULAR | Status: DC | PRN
Start: 2024-06-06 — End: 2024-06-06
  Administered 2024-06-06: 30 mL via PERINEURAL

## 2024-06-06 MED ORDER — AMISULPRIDE (ANTIEMETIC) 5 MG/2ML IV SOLN: 10.0000 mg | Freq: Once | INTRAVENOUS | Status: DC | PRN

## 2024-06-06 MED ORDER — BUPIVACAINE-EPINEPHRINE (PF) 0.25% -1:200000 IJ SOLN
INTRAMUSCULAR | Status: AC
  Filled 2024-06-06: qty 30

## 2024-06-06 MED ORDER — LIDOCAINE 2% (20 MG/ML) 5 ML SYRINGE
INTRAMUSCULAR | Status: DC | PRN
  Administered 2024-06-06: 80 mg via INTRAVENOUS

## 2024-06-06 SURGICAL SUPPLY — 36 items
BENZOIN TINCTURE PRP APPL 2/3 (GAUZE/BANDAGES/DRESSINGS) IMPLANT
BLADE CLIPPER SURG (BLADE) IMPLANT
BLADE HEX COATED 2.75 (ELECTRODE) ×1 IMPLANT
BLADE SURG 15 STRL LF DISP TIS (BLADE) ×1 IMPLANT
CHLORAPREP W/TINT 26 (MISCELLANEOUS) ×1 IMPLANT
COVER BACK TABLE 60X90IN (DRAPES) ×1 IMPLANT
COVER MAYO STAND STRL (DRAPES) ×1 IMPLANT
DERMABOND ADVANCED .7 DNX12 (GAUZE/BANDAGES/DRESSINGS) ×1 IMPLANT
DRAIN PENROSE .5X12 LATEX STL (DRAIN) ×1 IMPLANT
DRAPE LAPAROTOMY TRNSV 102X78 (DRAPES) ×1 IMPLANT
DRAPE UTILITY XL STRL (DRAPES) ×1 IMPLANT
ELECTRODE REM PT RTRN 9FT ADLT (ELECTROSURGICAL) ×1 IMPLANT
GLOVE BIO SURGEON STRL SZ7.5 (GLOVE) ×1 IMPLANT
GOWN STRL REUS W/ TWL LRG LVL3 (GOWN DISPOSABLE) ×2 IMPLANT
MESH ULTRAPRO 3X6 7.6X15CM (Mesh General) IMPLANT
NDL HYPO 25X1 1.5 SAFETY (NEEDLE) ×1 IMPLANT
NEEDLE HYPO 25X1 1.5 SAFETY (NEEDLE) ×1 IMPLANT
NS IRRIG 1000ML POUR BTL (IV SOLUTION) ×1 IMPLANT
PACK BASIN DAY SURGERY FS (CUSTOM PROCEDURE TRAY) ×1 IMPLANT
PENCIL SMOKE EVACUATOR (MISCELLANEOUS) ×1 IMPLANT
SLEEVE SCD COMPRESS KNEE MED (STOCKING) ×1 IMPLANT
SPIKE FLUID TRANSFER (MISCELLANEOUS) IMPLANT
SPONGE T-LAP 18X18 ~~LOC~~+RFID (SPONGE) ×1 IMPLANT
STRIP CLOSURE SKIN 1/2X4 (GAUZE/BANDAGES/DRESSINGS) IMPLANT
SUT MON AB 4-0 PC3 18 (SUTURE) ×1 IMPLANT
SUT PROLENE 2 0 SH DA (SUTURE) ×2 IMPLANT
SUT SILK 2 0 SH (SUTURE) IMPLANT
SUT SILK 3 0 SH 30 (SUTURE) IMPLANT
SUT SILK 3-0 18XBRD TIE BLK (SUTURE) ×1 IMPLANT
SUT VIC AB 0 CT1 27XBRD ANBCTR (SUTURE) IMPLANT
SUT VIC AB 2-0 SH 27XBRD (SUTURE) ×1 IMPLANT
SUT VIC AB 3-0 SH 27X BRD (SUTURE) ×1 IMPLANT
SUT VICRYL 0 SH 27 (SUTURE) IMPLANT
SYR BULB EAR ULCER 3OZ GRN STR (SYRINGE) ×1 IMPLANT
SYR CONTROL 10ML LL (SYRINGE) ×1 IMPLANT
TOWEL GREEN STERILE FF (TOWEL DISPOSABLE) ×2 IMPLANT

## 2024-06-06 NOTE — Anesthesia Procedure Notes (Signed)
 Procedure Name: Intubation Date/Time: 06/06/2024 1:52 PM  Performed by: Denton Niels CROME, CRNAPre-anesthesia Checklist: Patient identified, Emergency Drugs available, Suction available and Patient being monitored Patient Re-evaluated:Patient Re-evaluated prior to induction Oxygen Delivery Method: Circle system utilized Preoxygenation: Pre-oxygenation with 100% oxygen Induction Type: IV induction Ventilation: Mask ventilation without difficulty Laryngoscope Size: Mac and 3 Grade View: Grade II Tube type: Oral Tube size: 7.5 mm Number of attempts: 1 Airway Equipment and Method: Stylet Placement Confirmation: ETT inserted through vocal cords under direct vision, positive ETCO2 and breath sounds checked- equal and bilateral Tube secured with: Tape Dental Injury: Teeth and Oropharynx as per pre-operative assessment

## 2024-06-06 NOTE — H&P (Signed)
 REFERRING PHYSICIAN: Dwight Ave, MD PROVIDER: DEWARD GARNETTE NULL, MD MRN: [id number] DOB: 1946/10/16 Subjective   Chief Complaint: New Consultation  History of Present Illness: Jordan Vazquez is a 78 y.o. male who is seen today as an office consultation for evaluation of New Consultation  We are asked to see the patient in consultation by Dr. Dwight to evaluate him for a left inguinal hernia. The patient is a 78 year old white male who first noticed a bulge in the left groin about a month ago. He does a lot of manual labor but does not remember a particular event that started it. He does have some discomfort associated with it. He denies any nausea or vomiting. He is on Eliquis  for atrial fibrillation and is followed by Dr. Swaziland  Review of Systems: A complete review of systems was obtained from the patient. I have reviewed this information and discussed as appropriate with the patient. See HPI as well for other ROS.  ROS   Medical History: Past Medical History:  Diagnosis Date  Arrhythmia  Arthritis  Hypertension  Skin cancer   Patient Active Problem List  Diagnosis  Left inguinal hernia   Past Surgical History:  Procedure Laterality Date  CATARACT EXTRACTION  HERNIA REPAIR  JOINT REPLACEMENT  Toe amputation    Allergies  Allergen Reactions  Niacin Rash   Current Outpatient Medications on File Prior to Visit  Medication Sig Dispense Refill  acetaminophen  (TYLENOL ) 500 MG tablet Take by mouth  atorvastatin  (LIPITOR) 40 MG tablet 1 tablet orally once a day 90 days  baclofen  (LIORESAL ) 10 MG tablet Take 10 mg by mouth 3 (three) times daily as needed  cholecalciferol (VITAMIN D3) 2,000 unit tablet Take 2,000 Units by mouth once daily  ELIQUIS  5 mg tablet Take 5 mg by mouth 2 (two) times daily  gabapentin  (NEURONTIN ) 300 MG capsule 3 capsule by mouth three times a day  hydroCHLOROthiazide (HYDRODIURIL) 12.5 MG tablet 1 tablet in the morning orally once a day 90 days   hydroxyurea  (HYDREA ) 500 mg capsule Take 2 capsules (1,000 mg total) by mouth daily on Monday/Tuesday/Wednesday/Thursday/Friday and 1 cap (500mg ) po daily on Saturday and sunday. May take with food to minimize GI side effects.  krill/om-3/dha/epa/phospho/ast (OMEGA-3 KRILL OIL ORAL) Take by mouth  multivitamin tablet Take 1 tablet by mouth once daily  oxyCODONE -acetaminophen  (PERCOCET) 10-325 mg tablet 1 tablet by mouth every four hours as needed for pain   No current facility-administered medications on file prior to visit.   History reviewed. No pertinent family history.   Social History   Tobacco Use  Smoking Status Never  Smokeless Tobacco Never    Social History   Socioeconomic History  Marital status: Married  Tobacco Use  Smoking status: Never  Smokeless tobacco: Never  Substance and Sexual Activity  Alcohol use: Never  Drug use: Never   Social Drivers of Health   Housing Stability: Unknown (04/30/2024)  Housing Stability Vital Sign  Homeless in the Last Year: No   Objective:   Vitals:  BP: (!) 140/80  Pulse: 61  Temp: 36.7 C (98 F)  SpO2: 98%  Weight: 88.2 kg (194 lb 6.4 oz)  Height: 179.1 cm (5' 10.5)  PainSc: 1  PainLoc: Groin   Body mass index is 27.5 kg/m.  Physical Exam Constitutional:  General: He is not in acute distress. Appearance: Normal appearance.  HENT:  Head: Normocephalic and atraumatic.  Right Ear: External ear normal.  Left Ear: External ear normal.  Nose: Nose normal.  Mouth/Throat:  Mouth: Mucous membranes are moist.  Pharynx: Oropharynx is clear.   Eyes:  General: No scleral icterus. Extraocular Movements: Extraocular movements intact.  Conjunctiva/sclera: Conjunctivae normal.  Pupils: Pupils are equal, round, and reactive to light.   Cardiovascular:  Rate and Rhythm: Normal rate. Rhythm irregular.  Pulses: Normal pulses.  Heart sounds: Normal heart sounds.  Pulmonary:  Effort: Pulmonary effort is normal. No  respiratory distress.  Breath sounds: Normal breath sounds.  Abdominal:  General: Abdomen is flat. Bowel sounds are normal. There is no distension.  Palpations: Abdomen is soft.  Tenderness: There is no abdominal tenderness.  Genitourinary: Comments: There is a palpable bulge in the left groin that reduces with palpation. There is no palpable bulge or impulse with straining in the right groin  Musculoskeletal:  General: No swelling or deformity. Normal range of motion.  Cervical back: Normal range of motion and neck supple. No tenderness.   Skin: General: Skin is warm and dry.  Coloration: Skin is not jaundiced.   Neurological:  General: No focal deficit present.  Mental Status: He is alert and oriented to person, place, and time.   Psychiatric:  Mood and Affect: Mood normal.  Behavior: Behavior normal.     Labs, Imaging and Diagnostic Testing:  Assessment and Plan:   Diagnoses and all orders for this visit:  Left inguinal hernia - [facility] Case Posting Request; Future   The patient appears to have a symptomatic left inguinal hernia. Because of the risk of incarceration and strangulation I feel he would benefit from having this fixed. He would also like to have this done. I have discussed with him in detail the risks and benefits of the operation to fix the hernia as well as some of the technical aspects including the use of mesh and the risk of chronic pain and he understands and wishes to proceed. He does have a pain doctor so they will need to manage his pain medicine after surgery. We will get cardiac clearance from Dr. Swaziland then move forward with surgical scheduling

## 2024-06-06 NOTE — Discharge Instructions (Addendum)
  Post Anesthesia Home Care Instructions  Activity: Get plenty of rest for the remainder of the day. A responsible individual must stay with you for 24 hours following the procedure.  For the next 24 hours, DO NOT: -Drive a car -Advertising copywriter -Drink alcoholic beverages -Take any medication unless instructed by your physician -Make any legal decisions or sign important papers.  Meals: Start with liquid foods such as gelatin or soup. Progress to regular foods as tolerated. Avoid greasy, spicy, heavy foods. If nausea and/or vomiting occur, drink only clear liquids until the nausea and/or vomiting subsides. Call your physician if vomiting continues.  Special Instructions/Symptoms: Your throat may feel dry or sore from the anesthesia or the breathing tube placed in your throat during surgery. If this causes discomfort, gargle with warm salt water. The discomfort should disappear within 24 hours.  Next dose of tylenol  if needed will be at 6:38pm

## 2024-06-06 NOTE — Progress Notes (Signed)
Assisted Dr. Miller with left, transabdominal plane, ultrasound guided block. Side rails up, monitors on throughout procedure. See vital signs in flow sheet. Tolerated Procedure well. 

## 2024-06-06 NOTE — Anesthesia Preprocedure Evaluation (Addendum)
 Anesthesia Evaluation  Patient identified by MRN, date of birth, ID band Patient awake    Reviewed: Allergy & Precautions, H&P , NPO status , Patient's Chart, lab work & pertinent test results, reviewed documented beta blocker date and time   Airway Mallampati: II  TM Distance: >3 FB Neck ROM: Full    Dental  (+) Teeth Intact   Pulmonary former smoker   breath sounds clear to auscultation       Cardiovascular hypertension, Pt. on medications + dysrhythmias Atrial Fibrillation  Rhythm:Irregular Rate:Normal  Denies cardiac symptoms   Neuro/Psych negative neurological ROS  negative psych ROS   GI/Hepatic negative GI ROS, Neg liver ROS,,,  Endo/Other  negative endocrine ROS    Renal/GU negative Renal ROS  negative genitourinary   Musculoskeletal  (+) Arthritis , Osteoarthritis,    Abdominal   Peds negative pediatric ROS (+)  Hematology negative hematology ROS (+)   Anesthesia Other Findings   Reproductive/Obstetrics negative OB ROS                              Anesthesia Physical Anesthesia Plan  ASA: 3  Anesthesia Plan: General   Post-op Pain Management: Regional block* and Tylenol  PO (pre-op)*   Induction: Intravenous  PONV Risk Score and Plan: 2 and Ondansetron , Midazolam  and Treatment may vary due to age or medical condition  Airway Management Planned: Oral ETT  Additional Equipment:   Intra-op Plan:   Post-operative Plan: Extubation in OR  Informed Consent: I have reviewed the patients History and Physical, chart, labs and discussed the procedure including the risks, benefits and alternatives for the proposed anesthesia with the patient or authorized representative who has indicated his/her understanding and acceptance.       Plan Discussed with: CRNA and Surgeon  Anesthesia Plan Comments:          Anesthesia Quick Evaluation

## 2024-06-06 NOTE — Op Note (Signed)
 06/06/2024  3:18 PM  PATIENT:  Jordan Vazquez  78 y.o. male  PRE-OPERATIVE DIAGNOSIS:  LEFT INGUINAL HERNIA  POST-OPERATIVE DIAGNOSIS:  LEFT INGUINAL HERNIA  PROCEDURE:  Procedure(s): LEFT INGUINAL HERNIA REPAIR WITH MESH  SURGEON:  Surgeons and Role:    * Curvin Deward MOULD, MD - Primary  PHYSICIAN ASSISTANT:   ASSISTANTS: none   ANESTHESIA:   local and general  EBL:  10 mL   BLOOD ADMINISTERED:none  DRAINS: none   LOCAL MEDICATIONS USED:  MARCAINE      SPECIMEN:  Source of Specimen:  hernia sac  DISPOSITION OF SPECIMEN:  PATHOLOGY  COUNTS:  YES  TOURNIQUET:  * No tourniquets in log *  DICTATION: .Dragon Dictation  After informed consent was obtained the patient was brought to the operating room and placed in the supine position on the operating room table.  After adequate induction of general anesthesia the patient's abdomen and left groin were prepped with ChloraPrep, allowed to dry, and and draped in usual sterile manner.  An appropriate timeout was performed.  The left groin was then infiltrated with quarter percent Marcaine .  A small incision was made from the edge of the pubic tubercle on the left towards the anterior superior iliac spine with a 15 blade knife.  The incision was carried through the skin and subcutaneous tissue sharply with the electrocautery until the fascia of the external oblique was encountered.  A small bridging vein was clamped with hemostats, divided, and ligated with 3-0 silk ties.  The external bleak fascia was opened along its fibers towards the apex of the external ring with a 15 blade knife and Metzenbaum scissors.  A wheat Lander retractor was deployed.  Blunt dissection was carried out of the cord structures until they could be surrounded between 2 fingers.  1/2 inch Penrose drain was placed around the cord structures for retraction purposes.  The cord was then gently skeletonized by blunt hemostat dissection.  I was able to identify a hernia sac  and gently separated from the rest of the cord structures taking care not to injure the vas deferens or testicular vessels.  During the dissection I did look for but did not see the ilioinguinal nerve.  The hernia sac was then opened and there were no visceral contents within the sac.  The sac was ligated at its base with a 2-0 silk suture ligature.  The distal sac was excised and sent to pathology.  The stump of the sac was allowed to retract back beneath the transversalis muscle.  The floor of the canal was then repaired with interrupted 0 Vicryl stitches.  A 3 x 6 piece of ultra Pro mesh was chosen and cut to the appropriate size.  Tails were cut in the mesh laterally.  The mesh was sewed inferiorly to the shelving edge of the inguinal ligament with a running 2-0 Prolene stitch.  The tails were wrapped around the cord structures.  Medially and superiorly the mesh was sewed to the muscular aponeurotic strength of the transversalis with interrupted 2-0 Prolene vertical mattress stitches.  Lateral to the cord the tails of the mesh were anchored to the shelving edge of the inguinal ligament with interrupted 2-0 Prolene stitches.  Once this was accomplished the mesh was in good position and the hernia seem well repaired.  The wound was irrigated with copious amounts of saline.  The external bleak fascia was then reapproximated with a running 2-0 Vicryl stitch.  The wound was infiltrated with more  quarter percent Marcaine .  The subcutaneous fascia was closed with a running 3-0 Vicryl stitch.  The skin was closed with a running 4-0 Monocryl subcuticular stitch.  Dermabond dressings were applied.  The patient tolerated the procedure well.  At the end of the case all needle sponge and instrument counts were correct.  The patient was then awakened and taken to recovery in stable condition.  The patient's testicle was in the scrotum at the end of the case.  PLAN OF CARE: Discharge to home after PACU  PATIENT  DISPOSITION:  PACU - hemodynamically stable.   Delay start of Pharmacological VTE agent (>24hrs) due to surgical blood loss or risk of bleeding: not applicable

## 2024-06-06 NOTE — Interval H&P Note (Signed)
 History and Physical Interval Note:  06/06/2024 1:32 PM  Jordan Vazquez  has presented today for surgery, with the diagnosis of LEFT INGUINAL HERNIA.  The various methods of treatment have been discussed with the patient and family. After consideration of risks, benefits and other options for treatment, the patient has consented to  Procedure(s): REPAIR, HERNIA, INGUINAL, ADULT (Left) as a surgical intervention.  The patient's history has been reviewed, patient examined, no change in status, stable for surgery.  I have reviewed the patient's chart and labs.  Questions were answered to the patient's satisfaction.     Deward Null III

## 2024-06-06 NOTE — Transfer of Care (Signed)
 Immediate Anesthesia Transfer of Care Note  Patient: Jordan Vazquez  Procedure(s) Performed: REPAIR, HERNIA, INGUINAL, ADULT (Left: Groin)  Patient Location: PACU  Anesthesia Type:GA combined with regional for post-op pain  Level of Consciousness: awake and patient cooperative  Airway & Oxygen Therapy: Patient Spontanous Breathing and Patient connected to face mask oxygen  Post-op Assessment: Report given to RN and Post -op Vital signs reviewed and stable  Post vital signs: Reviewed and stable  Last Vitals:  Vitals Value Taken Time  BP 135/73 06/06/24 15:20  Temp    Pulse 70 06/06/24 15:23  Resp 17 06/06/24 15:23  SpO2 100 % 06/06/24 15:23  Vitals shown include unfiled device data.  Last Pain:  Vitals:   06/06/24 1226  PainSc: 4          Complications: No notable events documented.

## 2024-06-06 NOTE — Anesthesia Procedure Notes (Signed)
 Anesthesia Regional Block: TAP block   Pre-Anesthetic Checklist: , timeout performed,  Correct Patient, Correct Site, Correct Laterality,  Correct Procedure, Correct Position, site marked,  Risks and benefits discussed,  Surgical consent,  Pre-op evaluation,  At surgeon's request and post-op pain management  Laterality: Left  Prep: chloraprep       Needles:  Injection technique: Single-shot  Needle Type: Stimiplex     Needle Length: 9cm  Needle Gauge: 21     Additional Needles:   Procedures:,,,, ultrasound used (permanent image in chart),,    Narrative:  Start time: 06/06/2024 1:09 PM End time: 06/06/2024 1:14 PM Injection made incrementally with aspirations every 5 mL.  Performed by: Personally  Anesthesiologist: Cleotilde Butler Dade, MD

## 2024-06-06 NOTE — Anesthesia Postprocedure Evaluation (Signed)
 Anesthesia Post Note  Patient: Jordan Vazquez  Procedure(s) Performed: REPAIR, HERNIA, INGUINAL, ADULT (Left: Groin)     Patient location during evaluation: PACU Anesthesia Type: General Level of consciousness: awake and alert Pain management: pain level controlled Vital Signs Assessment: post-procedure vital signs reviewed and stable Respiratory status: spontaneous breathing, nonlabored ventilation and respiratory function stable Cardiovascular status: blood pressure returned to baseline and stable Postop Assessment: no apparent nausea or vomiting Anesthetic complications: no   No notable events documented.  Last Vitals:  Vitals:   06/06/24 1530 06/06/24 1556  BP: 111/62 (!) 159/79  Pulse: 63 70  Resp: 18 17  Temp:  36.6 C  SpO2: 94% 95%    Last Pain:  Vitals:   06/06/24 1558  TempSrc:   PainSc: 5                  Butler Levander Pinal

## 2024-06-07 ENCOUNTER — Encounter (HOSPITAL_BASED_OUTPATIENT_CLINIC_OR_DEPARTMENT_OTHER): Payer: Self-pay | Admitting: General Surgery

## 2024-06-07 LAB — SURGICAL PATHOLOGY

## 2024-07-10 DIAGNOSIS — G894 Chronic pain syndrome: Secondary | ICD-10-CM | POA: Diagnosis not present

## 2024-07-10 DIAGNOSIS — M62830 Muscle spasm of back: Secondary | ICD-10-CM | POA: Diagnosis not present

## 2024-07-10 DIAGNOSIS — Z79891 Long term (current) use of opiate analgesic: Secondary | ICD-10-CM | POA: Diagnosis not present

## 2024-07-10 DIAGNOSIS — M47817 Spondylosis without myelopathy or radiculopathy, lumbosacral region: Secondary | ICD-10-CM | POA: Diagnosis not present

## 2024-07-10 DIAGNOSIS — M150 Primary generalized (osteo)arthritis: Secondary | ICD-10-CM | POA: Diagnosis not present

## 2024-07-17 ENCOUNTER — Other Ambulatory Visit: Payer: Self-pay

## 2024-07-17 DIAGNOSIS — D473 Essential (hemorrhagic) thrombocythemia: Secondary | ICD-10-CM

## 2024-07-18 ENCOUNTER — Inpatient Hospital Stay: Attending: Hematology

## 2024-07-18 DIAGNOSIS — Z888 Allergy status to other drugs, medicaments and biological substances status: Secondary | ICD-10-CM | POA: Insufficient documentation

## 2024-07-18 DIAGNOSIS — D473 Essential (hemorrhagic) thrombocythemia: Secondary | ICD-10-CM | POA: Insufficient documentation

## 2024-07-18 DIAGNOSIS — Z79899 Other long term (current) drug therapy: Secondary | ICD-10-CM | POA: Insufficient documentation

## 2024-07-18 DIAGNOSIS — C4491 Basal cell carcinoma of skin, unspecified: Secondary | ICD-10-CM | POA: Diagnosis not present

## 2024-07-18 DIAGNOSIS — D75839 Thrombocytosis, unspecified: Secondary | ICD-10-CM | POA: Diagnosis not present

## 2024-07-18 DIAGNOSIS — Z87891 Personal history of nicotine dependence: Secondary | ICD-10-CM | POA: Insufficient documentation

## 2024-07-18 DIAGNOSIS — I4891 Unspecified atrial fibrillation: Secondary | ICD-10-CM | POA: Diagnosis not present

## 2024-07-18 DIAGNOSIS — Z7901 Long term (current) use of anticoagulants: Secondary | ICD-10-CM | POA: Diagnosis not present

## 2024-07-18 DIAGNOSIS — Z7964 Long term (current) use of myelosuppressive agent: Secondary | ICD-10-CM | POA: Diagnosis not present

## 2024-07-18 DIAGNOSIS — M1710 Unilateral primary osteoarthritis, unspecified knee: Secondary | ICD-10-CM | POA: Diagnosis not present

## 2024-07-18 LAB — CBC WITH DIFFERENTIAL (CANCER CENTER ONLY)
Abs Immature Granulocytes: 0.02 K/uL (ref 0.00–0.07)
Basophils Absolute: 0 K/uL (ref 0.0–0.1)
Basophils Relative: 1 %
Eosinophils Absolute: 0.1 K/uL (ref 0.0–0.5)
Eosinophils Relative: 2 %
HCT: 40.5 % (ref 39.0–52.0)
Hemoglobin: 15 g/dL (ref 13.0–17.0)
Immature Granulocytes: 0 %
Lymphocytes Relative: 21 %
Lymphs Abs: 1.1 K/uL (ref 0.7–4.0)
MCH: 39.6 pg — ABNORMAL HIGH (ref 26.0–34.0)
MCHC: 37 g/dL — ABNORMAL HIGH (ref 30.0–36.0)
MCV: 106.9 fL — ABNORMAL HIGH (ref 80.0–100.0)
Monocytes Absolute: 0.7 K/uL (ref 0.1–1.0)
Monocytes Relative: 13 %
Neutro Abs: 3.4 K/uL (ref 1.7–7.7)
Neutrophils Relative %: 63 %
Platelet Count: 214 K/uL (ref 150–400)
RBC: 3.79 MIL/uL — ABNORMAL LOW (ref 4.22–5.81)
RDW: 13.7 % (ref 11.5–15.5)
WBC Count: 5.3 K/uL (ref 4.0–10.5)
nRBC: 0 % (ref 0.0–0.2)

## 2024-07-18 LAB — CMP (CANCER CENTER ONLY)
ALT: 21 U/L (ref 0–44)
AST: 31 U/L (ref 15–41)
Albumin: 4.6 g/dL (ref 3.5–5.0)
Alkaline Phosphatase: 67 U/L (ref 38–126)
Anion gap: 6 (ref 5–15)
BUN: 17 mg/dL (ref 8–23)
CO2: 31 mmol/L (ref 22–32)
Calcium: 9.5 mg/dL (ref 8.9–10.3)
Chloride: 103 mmol/L (ref 98–111)
Creatinine: 0.77 mg/dL (ref 0.61–1.24)
GFR, Estimated: >60 mL/min (ref >60)
Glucose, Bld: 106 mg/dL — ABNORMAL HIGH (ref 70–99)
Potassium: 4.4 mmol/L (ref 3.5–5.1)
Sodium: 140 mmol/L (ref 135–145)
Total Bilirubin: 0.9 mg/dL (ref 0.0–1.2)
Total Protein: 7.8 g/dL (ref 6.5–8.1)

## 2024-07-20 ENCOUNTER — Inpatient Hospital Stay (HOSPITAL_BASED_OUTPATIENT_CLINIC_OR_DEPARTMENT_OTHER): Admitting: Hematology

## 2024-07-20 DIAGNOSIS — C4491 Basal cell carcinoma of skin, unspecified: Secondary | ICD-10-CM | POA: Diagnosis not present

## 2024-07-20 DIAGNOSIS — Z7901 Long term (current) use of anticoagulants: Secondary | ICD-10-CM | POA: Diagnosis not present

## 2024-07-20 DIAGNOSIS — Z888 Allergy status to other drugs, medicaments and biological substances status: Secondary | ICD-10-CM | POA: Diagnosis not present

## 2024-07-20 DIAGNOSIS — I4891 Unspecified atrial fibrillation: Secondary | ICD-10-CM | POA: Diagnosis not present

## 2024-07-20 DIAGNOSIS — Z79899 Other long term (current) drug therapy: Secondary | ICD-10-CM | POA: Diagnosis not present

## 2024-07-20 DIAGNOSIS — M1710 Unilateral primary osteoarthritis, unspecified knee: Secondary | ICD-10-CM | POA: Diagnosis not present

## 2024-07-20 DIAGNOSIS — D473 Essential (hemorrhagic) thrombocythemia: Secondary | ICD-10-CM | POA: Diagnosis not present

## 2024-07-20 DIAGNOSIS — Z87891 Personal history of nicotine dependence: Secondary | ICD-10-CM | POA: Diagnosis not present

## 2024-07-20 DIAGNOSIS — D75839 Thrombocytosis, unspecified: Secondary | ICD-10-CM | POA: Diagnosis not present

## 2024-07-20 DIAGNOSIS — Z7964 Long term (current) use of myelosuppressive agent: Secondary | ICD-10-CM | POA: Diagnosis not present

## 2024-07-29 MED ORDER — HYDROXYUREA 500 MG PO CAPS: ORAL_CAPSULE | ORAL | 3 refills | Status: DC

## 2024-07-29 NOTE — Progress Notes (Signed)
 HEMATOLOGY ONCOLOGY PHONE VISIT NOTE  Date of service:  .07/20/2024   Patient Care Team: Jordan Trula SQUIBB, MD as PCP - General (Internal Medicine) [place], Peter M, MD as PCP - Cardiology (Cardiology)  CC Follow-up for continued evaluation and management of Essential Thrombocythemia  Diagnosis:  JAK2 Essential thrombocytosis   Current Treatment: Hydroxyurea  1000 mg 3 times a week and 500 mg rest of the week.   INTERVAL HISTORY:  .[name] connected with Jordan Vazquez on 07/20/2024 at  8:40 AM EDT by telephone visit and verified that I am speaking with the correct person using two identifiers.   I discussed the limitations, risks, security and privacy concerns of performing an evaluation and management service by telemedicine and the availability of in-person appointments. I also discussed with the patient that there may be a patient responsible charge related to this service. The patient expressed understanding and agreed to proceed.   Other persons participating in the visit and their role in the encounter: none   Patient's location: Home  Provider's location: [facility]   Chief Complaint: f/u for essential thrombocytosis  Patient notes no acute new symptoms since his last clinic visit.  REVIEW OF SYSTEMS:    10 Point review of Systems was done is negative except as noted above.  He had uneventful left inguinal hernia surgery in July 2025 with Dr. Curvin. No new leg pain or swelling.  No chest pain or shortness of breath.  No other acute VTE events.  Tolerating his hydroxyurea  without any acute issues.  No infection issues. Chronic arthritic knee pains and back pain.  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    Hyperlipidemia    Hypertension    Peripheral neuropathy     Past Surgical History:  Procedure Laterality  Date   CARDIAC CATHETERIZATION     16 years ago negative    HERNIA REPAIR     right inguinal hernia surgery    I & D KNEE WITH POLY EXCHANGE  12/06/2011   Procedure: IRRIGATION AND DEBRIDEMENT KNEE WITH POLY EXCHANGE;  Surgeon: Jordan JONETTA Car, MD;  Location: [facility];  Service: Orthopedics;  Laterality: Right;  Right Total Knee Irrigation and Debridement with Poly Exchange   INGUINAL HERNIA REPAIR Left 06/06/2024   Procedure: REPAIR, HERNIA, INGUINAL, ADULT;  Surgeon: Jordan Deward MOULD, MD;  Location: [facility];  Service: General;  Laterality: Left;   JOINT REPLACEMENT     left knee 5/12, right TKA 10/12    TOE AMPUTATION Right     Social History   Tobacco Use   Smoking status: Former    Current packs/day: 0.00    Types: Cigarettes    Quit date: 11/16/1963    Years since quitting: 60.7   Smokeless tobacco: Never  Vaping Use   Vaping status: Never Used  Substance Use Topics   Alcohol use: Yes    Alcohol/week: 2.0 standard drinks of alcohol    Types: 2 Cans of beer per week   Drug use: No    ALLERGIES:  is allergic to niacin, niacin and related, duloxetine  hcl, and simvastatin.  MEDICATIONS:  Current Outpatient Medications  Medication Sig Dispense Refill   atorvastatin  (LIPITOR) 40 MG tablet Take 40 mg by mouth every morning.     baclofen  (LIORESAL ) 10 MG tablet Take 10 mg by  mouth 2 (two) times daily.  0   Cholecalciferol (VITAMIN D-3) 1000 UNITS CAPS Take 2,000 Units by mouth daily.     ELIQUIS  5 MG TABS tablet Take 1 tablet (5 mg total) by mouth 2 (two) times daily. 60 tablet 11   gabapentin  (NEURONTIN ) 300 MG capsule Take 300 mg by mouth 3 (three) times daily.     hydrochlorothiazide (HYDRODIURIL) 12.5 MG tablet Take 12.5 mg by mouth every morning.     hydroxyurea  (HYDREA ) 500 MG capsule Take 2 capsules (1,000 mg total) by mouth daily on Monday/Tuesday/Wednesday/Thursday and 1 cap (500mg ) po daily on Friday, Saturday and sunday. May take with food to minimize GI  side effects. 180 capsule 1   Multiple Vitamin (MULITIVITAMIN WITH MINERALS) TABS Take 2 tablets by mouth daily.      oxyCODONE -acetaminophen  (PERCOCET) 10-325 MG tablet Take 1 tablet by mouth every 4 (four) hours as needed.     No current facility-administered medications for this visit.    PHYSICAL EXAMINATION: .There were no vitals taken for this visit. GENERAL:alert, in no acute distress and comfortable SKIN: no acute rashes, no significant lesions EYES: conjunctiva are pink and non-injected, sclera anicteric OROPHARYNX: MMM, no exudates, no oropharyngeal erythema or ulceration NECK: supple, no JVD LYMPH:  no palpable lymphadenopathy in the cervical, axillary or inguinal regions LUNGS: clear to auscultation b/l with normal respiratory effort HEART: regular rate & rhythm ABDOMEN:  normoactive bowel sounds , non tender, not distended. Extremity: no pedal edema PSYCH: alert & oriented x 3 with fluent speech NEURO: no focal motor/sensory deficits   LABORATORY DATA:   I have reviewed the data as listed  .    Latest Ref Rng & Units 07/18/2024    7:28 AM 03/26/2024    7:15 AM 12/19/2023    7:29 AM  CBC  WBC 4.0 - 10.5 K/uL 5.3  4.8  5.1   Hemoglobin 13.0 - 17.0 g/dL 84.9  85.1  84.2   Hematocrit 39.0 - 52.0 % 40.5  40.9  44.3   Platelets 150 - 400 K/uL 214  258  259       Latest Ref Rng & Units 07/18/2024    7:28 AM 06/01/2024   12:25 PM 03/26/2024    7:15 AM  CMP  Glucose 70 - 99 mg/dL 893  87  78   BUN 8 - 23 mg/dL 17  15  17    Creatinine 0.61 - 1.24 mg/dL 9.22  9.12  9.11   Sodium 135 - 145 mmol/L 140  140  140   Potassium 3.5 - 5.1 mmol/L 4.4  4.5  4.5   Chloride 98 - 111 mmol/L 103  105  104   CO2 22 - 32 mmol/L 31  25  32   Calcium  8.9 - 10.3 mg/dL 9.5  9.3  9.0   Total Protein 6.5 - 8.1 g/dL 7.8   7.4   Total Bilirubin 0.0 - 1.2 mg/dL 0.9   0.7   Alkaline Phos 38 - 126 U/L 67   80   AST 15 - 41 U/L 31   28   ALT 0 - 44 U/L 21   18       RADIOGRAPHIC  STUDIES:  I have personally reviewed the radiological images as listed and agreed with the findings in the report.  US  abd 03/15/2017: IMPRESSION: 1. Liver is echogenic consistent with fatty infiltration and/or hepatocellular disease. No focal hepatic abnormality. No gallstones or biliary distention.   2. Spleen is enlarged  at 14.4 cm with a volume of 620 cc.    Electronically Signed   By: Debby  Register   On: 03/15/2017 09:15   ASSESSMENT & PLAN: '  78 y.o.  Caucasian male with   #1  JAK2 positive essential thrombocytosis #2 h/o Abnormal LFTs - AST and ALT .normalized on labs today.  Likely from fatty liver. -will continue to monitor. #3 basal cell skin cancer follows with dermatology #4 atrial fibrillation on anticoagulation follows with cardiology #5. 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 -Tolerating his current dose of hydroxyurea  without any new toxicities. No infection issues and healing well from recent left inguinal hernia surgery in July 2025. -Will reduce his hydroxyurea  to 2 capsules (1,000 mg total) by mouth daily on Monday/Tuesday/Wednesday and 1 cap (500mg ) po daily on Thursday, Friday, Saturday and sunday. May take with food to minimize GI side effects. -He continues to  be on Eliquis  for his atrial fibrillation and also helps with VTE prophylaxis. -Continue vitamin B complex and vitamin D. Continue dermatology follow-up for skin screening FOLLOW UP:  Return to clinic with Dr. Onesimo with labs in 4 months (patient prefers to be the first patient on Wednesday morning)   .The total time spent in the appointment was 20 minutes* .  All of the patient's questions were answered with apparent satisfaction. The patient knows to call the clinic with any problems, questions or concerns.   Emaline Onesimo MD MS AAHIVMS [facility] Hematology/Oncology Physician [facility]  .*Total Encounter Time as defined by the Centers for Medicare and Medicaid Services includes, in addition to the face-to-face time of a patient visit (documented in the note above) non-face-to-face time: obtaining and reviewing outside history, ordering and reviewing medications, tests or procedures, care coordination (communications with other health care professionals or caregivers) and documentation in the medical record.

## 2024-08-12 NOTE — Progress Notes (Signed)
 Cardiology Office Note:    Date:  08/22/2024   ID:  Jordan Vazquez, DOB 02-15-1946, MRN [id number]  PCP:  [name], MD    [facility] Providers Cardiologist:  Rhea Thrun [place], MD     Referring MD: Cicero Aureliano SAUNDERS, MD   Chief Complaint  Patient presents with   Atrial Fibrillation    History of Present Illness:    Jordan Vazquez is a 78 y.o. male seen at the request  of Dr Dwight for evaluation of  atrial fibrillation. He has a history of HTN. He was admitted in July 2023 with acute metabolic encephalopathy in setting of UTI and dehydration. Ecg at that time showed Afib with controlled rate. This was not mentioned in notes at that time.   The patient reports he has had an arrhythmia for many years. Last documented Ecg in 2012 showed NSR. He states he saw me remotely in the 1990s and had a cardiac cath in 1999 that was OK. He denies any chest pain, dyspnea, palpitations. No dizziness. Had prior amputation of some toes on right foot for an orthopedic issue (not vascular).   Echocardiogram 07/04/2023 showed an LVEF of 60-65%, intermediate diastolic parameters, severely dilated left atria, moderate mitral valve regurgitation, moderate tricuspid valve regurgitation and mild aortic valve regurgitation. He was started on HCT last year for BP.   He had left inguinal hernia repair this year. This went well.   He is doing ok. Limited by peripheral neuropathy. This affects his balance. Does note he gets exhausted with activity at times. No dizziness. BP at home 120-130 systolic. No chest pain or dyspnea.  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    Hyperlipidemia    Hypertension    Peripheral neuropathy     Past Surgical History:  Procedure Laterality Date   CARDIAC CATHETERIZATION     16 years ago negative     HERNIA REPAIR     right inguinal hernia surgery    I & D KNEE WITH POLY EXCHANGE  12/06/2011   Procedure: IRRIGATION AND DEBRIDEMENT KNEE WITH POLY EXCHANGE;  Surgeon: [name], MD;  Location: [facility];  Service: Orthopedics;  Laterality: Right;  Right Total Knee Irrigation and Debridement with Poly Exchange   INGUINAL HERNIA REPAIR Left 06/06/2024   Procedure: REPAIR, HERNIA, INGUINAL, ADULT;  Surgeon: Curvin Deward MOULD, MD;  [facility];  Service: General;  Laterality: Left;   JOINT REPLACEMENT     left knee 5/12, right TKA 10/12    TOE AMPUTATION Right     Current Medications: Current Meds  Medication Sig   atorvastatin  (LIPITOR) 40 MG tablet Take 40 mg by mouth every morning.   baclofen  (LIORESAL ) 10 MG tablet Take 10 mg by mouth 2 (two) times daily.   Cholecalciferol (VITAMIN D-3) 1000 UNITS CAPS Take 2,000 Units by mouth daily.   ELIQUIS  5 MG TABS tablet Take 1 tablet (5 mg total) by mouth 2 (two) times daily.   gabapentin  (NEURONTIN ) 300 MG capsule Take 300 mg by mouth 3 (three) times daily.   hydrochlorothiazide (HYDRODIURIL) 12.5 MG tablet Take 12.5 mg by mouth every morning.   hydroxyurea  (HYDREA ) 500 MG capsule Take 2 capsules (1,000 mg total) by mouth daily on Monday/Tuesday/Wednesday and 1 cap (500mg ) po daily on Thursday, Friday, Saturday and  sunday. May take with food to minimize GI side effects.   Multiple Vitamin (MULITIVITAMIN WITH MINERALS) TABS Take 2 tablets by mouth daily.    oxyCODONE -acetaminophen  (PERCOCET) 10-325 MG tablet Take 1 tablet by mouth every 4 (four) hours as needed.     Allergies:   Niacin, Niacin and related, Duloxetine  hcl, and Simvastatin   Social History   Socioeconomic History   Marital status: Married    Spouse name: Not on file   Number of children: 3   Years of education: Not on file   Highest education level: Not on file  Occupational History   Not on file  Tobacco Use   Smoking status: Former    Current  packs/day: 0.00    Types: Cigarettes    Quit date: 11/16/1963    Years since quitting: 60.8   Smokeless tobacco: Never  Vaping Use   Vaping status: Never Used  Substance and Sexual Activity   Alcohol use: Yes    Alcohol/week: 2.0 standard drinks of alcohol    Types: 2 Cans of beer per week   Drug use: No   Sexual activity: Not on file  Other Topics Concern   Not on file  Social History Narrative   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    Social Drivers of Corporate investment banker Strain: Not on file  Food Insecurity: Not on file  Transportation Needs: Not on file  Physical Activity: Not on file  Stress: Not on file  Social Connections: Not on file     Family History: The patient's family history includes Hypertension in his mother.  ROS:   Please see the history of present illness.     All other systems reviewed and are negative.  EKGs/Labs/Other Studies Reviewed:    The following studies were reviewed today: EKG Interpretation Date/Time:  Wednesday August 22 2024 07:40:30 EDT Ventricular Rate:  55 PR Interval:    QRS Duration:  106 QT Interval:  416 QTC Calculation: 397 R Axis:   12  Text Interpretation: Atrial fibrillation with slow ventricular response When compared with ECG of 25-Jul-2023 08:50, Questionable change in QRS axis Confirmed by [place], Makinzee Durley [phone]) on 08/22/2024 8:00:42 AM  EKG Interpretation Date/Time:  Wednesday August 22 2024 07:40:30 EDT Ventricular Rate:  55 PR Interval:    QRS Duration:  106 QT Interval:  416 QTC Calculation: 397 R Axis:   12  Text Interpretation: Atrial fibrillation with slow ventricular response When compared with ECG of 25-Jul-2023 08:50, Questionable change in QRS axis Confirmed by [place], [place] [phone]) on 08/22/2024 8:00:42 AM   EKG Interpretation Date/Time:  Wednesday August 22 2024 07:40:30 EDT Ventricular Rate:  55 PR Interval:    QRS Duration:  106 QT Interval:  416 QTC Calculation: 397 R  Axis:   12  Text Interpretation: Atrial fibrillation with slow ventricular response When compared with ECG of 25-Jul-2023 08:50, Questionable change in QRS axis Confirmed by [place], Nazifa Trinka [phone]) on 08/22/2024 8:00:42 AM    Recent Labs: 07/18/2024: ALT 21; BUN 17; Creatinine 0.77; Hemoglobin 15.0; Platelet Count 214; Potassium 4.4; Sodium 140  Recent Lipid Panel    Component Value Date/Time   CHOL 140 06/14/2023 0928   TRIG 235 (H) 06/14/2023 0928   HDL 43 06/14/2023 0928   CHOLHDL 3.3 06/14/2023 0928   LDLCALC 59 06/14/2023 0928   Dated 09/14/21: cholesterol 147, triglycerides 281, HDL 41, LDL 61.   Risk Assessment/Calculations:    CHA2DS2-VASc Score =     This indicates  a  % annual risk of stroke. The patient's score is based upon:             Physical Exam:    VS:  BP (!) 142/74 (BP Location: Left Arm, Patient Position: Sitting, Cuff Size: Normal)   Pulse (!) 56   Ht 5' 10.5 (1.791 m)   Wt 196 lb (88.9 kg)   SpO2 96%   BMI 27.73 kg/m     Wt Readings from Last 3 Encounters:  08/22/24 196 lb (88.9 kg)  06/06/24 192 lb 7.4 oz (87.3 kg)  03/26/24 201 lb 14.4 oz (91.6 kg)     GEN:  Well nourished, well developed in no acute distress HEENT: Normal NECK: No JVD; No carotid bruits LYMPHATICS: No lymphadenopathy CARDIAC: IRRR, no murmurs, rubs, gallops RESPIRATORY:  Clear to auscultation without rales, wheezing or rhonchi  ABDOMEN: Soft, non-tender, non-distended MUSCULOSKELETAL:  No edema; No deformity  SKIN: Warm and dry NEUROLOGIC:  Alert and oriented x 3 PSYCHIATRIC:  Normal affect   ASSESSMENT:    1. Longstanding persistent atrial fibrillation (HCC)   2. Essential hypertension     PLAN:    In order of problems listed above:  Atrial fibrillation permanent. Rate is controlled on no rate slowing medication and he is asymptomatic.  With ITALY Vasc score of 3 recommend anticoagulation with Eliquis  5 mg bid. He is to let me know if HR is dropping into 40s or  if has significant dizziness.  HTN. BP is controlled on low dose HCT HLD on lipitor and fish oil. Labs done yesterday by PCP Essential thrombocytosis. Controlled with hydroxyurea             Medication Adjustments/Labs and Tests Ordered: Current medicines are reviewed at length with the patient today.  Concerns regarding medicines are outlined above.  Orders Placed This Encounter  Procedures   EKG 12-Lead   No orders of the defined types were placed in this encounter.   Patient Instructions  Medication Instructions:  Continue same medications *If you need a refill on your cardiac medications before your next appointment, please call your pharmacy*  Lab Work: None ordered  Testing/Procedures: None ordered  Follow-Up: At [facility], you and your health needs are our priority.  As part of our continuing mission to provide you with exceptional heart care, our providers are all part of one team.  This team includes your primary Cardiologist (physician) and Advanced Practice Providers or APPs (Physician Assistants and Nurse Practitioners) who all work together to provide you with the care you need, when you need it.  Your next appointment:  1 year   Call in May to schedule Oct appointment     Provider:  Dr.Farren Nelles   We recommend signing up for the patient portal called MyChart.  Sign up information is provided on this After Visit Summary.  MyChart is used to connect with patients for Virtual Visits (Telemedicine).  Patients are able to view lab/test results, encounter notes, upcoming appointments, etc.  Non-urgent messages can be sent to your provider as well.   To learn more about what you can do with MyChart, go to [email or web address].      Signed, Kanita Delage [place], MD  08/22/2024 8:13 AM    [facility]

## 2024-08-13 DIAGNOSIS — C44319 Basal cell carcinoma of skin of other parts of face: Secondary | ICD-10-CM | POA: Diagnosis not present

## 2024-08-13 DIAGNOSIS — L570 Actinic keratosis: Secondary | ICD-10-CM | POA: Diagnosis not present

## 2024-08-13 DIAGNOSIS — Z85828 Personal history of other malignant neoplasm of skin: Secondary | ICD-10-CM | POA: Diagnosis not present

## 2024-08-13 DIAGNOSIS — L821 Other seborrheic keratosis: Secondary | ICD-10-CM | POA: Diagnosis not present

## 2024-08-13 DIAGNOSIS — L720 Epidermal cyst: Secondary | ICD-10-CM | POA: Diagnosis not present

## 2024-08-13 DIAGNOSIS — C44519 Basal cell carcinoma of skin of other part of trunk: Secondary | ICD-10-CM | POA: Diagnosis not present

## 2024-08-13 DIAGNOSIS — L820 Inflamed seborrheic keratosis: Secondary | ICD-10-CM | POA: Diagnosis not present

## 2024-08-13 DIAGNOSIS — L814 Other melanin hyperpigmentation: Secondary | ICD-10-CM | POA: Diagnosis not present

## 2024-08-21 DIAGNOSIS — R58 Hemorrhage, not elsewhere classified: Secondary | ICD-10-CM | POA: Diagnosis not present

## 2024-08-21 DIAGNOSIS — D473 Essential (hemorrhagic) thrombocythemia: Secondary | ICD-10-CM | POA: Diagnosis not present

## 2024-08-21 DIAGNOSIS — B354 Tinea corporis: Secondary | ICD-10-CM | POA: Diagnosis not present

## 2024-08-22 ENCOUNTER — Encounter: Payer: Self-pay | Admitting: Cardiology

## 2024-08-22 ENCOUNTER — Ambulatory Visit: Attending: Cardiology | Admitting: Cardiology

## 2024-08-22 VITALS — BP 142/74 | HR 56 | Ht 70.5 in | Wt 196.0 lb

## 2024-08-22 DIAGNOSIS — I10 Essential (primary) hypertension: Secondary | ICD-10-CM

## 2024-08-22 DIAGNOSIS — I4811 Longstanding persistent atrial fibrillation: Secondary | ICD-10-CM

## 2024-08-22 NOTE — Patient Instructions (Signed)

## 2024-09-03 ENCOUNTER — Other Ambulatory Visit: Payer: Self-pay | Admitting: Hematology

## 2024-09-03 DIAGNOSIS — D473 Essential (hemorrhagic) thrombocythemia: Secondary | ICD-10-CM

## 2024-09-04 DIAGNOSIS — M47817 Spondylosis without myelopathy or radiculopathy, lumbosacral region: Secondary | ICD-10-CM | POA: Diagnosis not present

## 2024-09-04 DIAGNOSIS — M62830 Muscle spasm of back: Secondary | ICD-10-CM | POA: Diagnosis not present

## 2024-09-04 DIAGNOSIS — M150 Primary generalized (osteo)arthritis: Secondary | ICD-10-CM | POA: Diagnosis not present

## 2024-09-04 DIAGNOSIS — G894 Chronic pain syndrome: Secondary | ICD-10-CM | POA: Diagnosis not present

## 2024-09-18 DIAGNOSIS — C44319 Basal cell carcinoma of skin of other parts of face: Secondary | ICD-10-CM | POA: Diagnosis not present

## 2024-10-17 DIAGNOSIS — D473 Essential (hemorrhagic) thrombocythemia: Secondary | ICD-10-CM | POA: Diagnosis not present

## 2024-10-17 DIAGNOSIS — I10 Essential (primary) hypertension: Secondary | ICD-10-CM | POA: Diagnosis not present

## 2024-10-17 DIAGNOSIS — M48062 Spinal stenosis, lumbar region with neurogenic claudication: Secondary | ICD-10-CM | POA: Diagnosis not present

## 2024-10-17 DIAGNOSIS — Z0000 Encounter for general adult medical examination without abnormal findings: Secondary | ICD-10-CM | POA: Diagnosis not present

## 2024-10-17 DIAGNOSIS — E7800 Pure hypercholesterolemia, unspecified: Secondary | ICD-10-CM | POA: Diagnosis not present

## 2024-10-17 DIAGNOSIS — Z23 Encounter for immunization: Secondary | ICD-10-CM | POA: Diagnosis not present

## 2024-10-24 ENCOUNTER — Telehealth (HOSPITAL_BASED_OUTPATIENT_CLINIC_OR_DEPARTMENT_OTHER): Payer: Self-pay

## 2024-10-24 DIAGNOSIS — R1310 Dysphagia, unspecified: Secondary | ICD-10-CM | POA: Diagnosis not present

## 2024-10-24 NOTE — Telephone Encounter (Signed)
° °  Pre-operative Risk Assessment    Patient Name: Jordan Vazquez  DOB: June 04, 1946 MRN: [id number]   Date of last office visit: 08/22/24 with Dr. Jordan Date of next office visit: NA  Request for Surgical Clearance    Procedure:  Endoscopy with Dilation  Date of Surgery:  Clearance 11/27/24                                 Surgeon:  Dr. Estefana Socks Group or Practice Name:  [facility] Phone number:  [phone] Fax number:  [phone]   Type of Clearance Requested:   - Medical  - Pharmacy:  Hold Apixaban  (Eliquis ) for 2 days prior   Type of Anesthesia:  propofol    Additional requests/questions:    Bonney Augustin JONETTA Delores   10/24/2024, 10:34 AM

## 2024-10-26 DIAGNOSIS — D126 Benign neoplasm of colon, unspecified: Secondary | ICD-10-CM | POA: Insufficient documentation

## 2024-10-26 DIAGNOSIS — E7800 Pure hypercholesterolemia, unspecified: Secondary | ICD-10-CM | POA: Insufficient documentation

## 2024-10-26 DIAGNOSIS — I4819 Other persistent atrial fibrillation: Secondary | ICD-10-CM | POA: Insufficient documentation

## 2024-10-26 DIAGNOSIS — E669 Obesity, unspecified: Secondary | ICD-10-CM | POA: Insufficient documentation

## 2024-10-26 DIAGNOSIS — M5431 Sciatica, right side: Secondary | ICD-10-CM | POA: Insufficient documentation

## 2024-10-26 DIAGNOSIS — M48062 Spinal stenosis, lumbar region with neurogenic claudication: Secondary | ICD-10-CM | POA: Insufficient documentation

## 2024-10-26 DIAGNOSIS — D7589 Other specified diseases of blood and blood-forming organs: Secondary | ICD-10-CM | POA: Insufficient documentation

## 2024-10-26 DIAGNOSIS — M5432 Sciatica, left side: Secondary | ICD-10-CM | POA: Insufficient documentation

## 2024-10-26 DIAGNOSIS — G2581 Restless legs syndrome: Secondary | ICD-10-CM | POA: Insufficient documentation

## 2024-10-26 DIAGNOSIS — Z860100 Personal history of colon polyps, unspecified: Secondary | ICD-10-CM | POA: Insufficient documentation

## 2024-10-26 NOTE — Telephone Encounter (Signed)
 Patient with diagnosis of A Fib on Eliquis  for anticoagulation.    Procedure: Endoscopy with Dilation  Date of procedure: 11/27/24   CHA2DS2-VASc Score = 3  This indicates a 3.2% annual risk of stroke. The patient's score is based upon: CHF History: 0 HTN History: 1 Diabetes History: 0 Stroke History: 0 Vascular Disease History: 0 Age Score: 2 Gender Score: 0    CrCl 99 ml/min Platelet count 214K   Patient does not require pre-op antibiotics for dental procedure.  Per office protocol, patient can hold Eliquis  for 2 days prior to procedure.    **This guidance is not considered finalized until pre-operative APP has relayed final recommendations.**

## 2024-10-26 NOTE — Telephone Encounter (Signed)
° °  Patient Name: Jordan Vazquez  DOB: 04-22-1946 MRN: [id number]  Primary Cardiologist: Peter Jordan, MD  Chart reviewed as part of pre-operative protocol coverage. Given past medical history and time since last visit, based on ACC/AHA guidelines, Jadarious Dobbins Greenfeld is at acceptable risk for the planned procedure without further cardiovascular testing.   Per office protocol, patient can hold Eliquis  for 2 days prior to procedure.     The patient was advised that if he develops new symptoms prior to surgery to contact our office to arrange for a follow-up visit, and he verbalized understanding.  I will route this recommendation to the requesting party via Epic fax function and remove from pre-op pool.  Please call with questions.  Lamarr Satterfield, NP 10/26/2024, 10:11 AM

## 2024-10-30 DIAGNOSIS — G894 Chronic pain syndrome: Secondary | ICD-10-CM | POA: Diagnosis not present

## 2024-10-30 DIAGNOSIS — M62830 Muscle spasm of back: Secondary | ICD-10-CM | POA: Diagnosis not present

## 2024-10-30 DIAGNOSIS — M150 Primary generalized (osteo)arthritis: Secondary | ICD-10-CM | POA: Diagnosis not present

## 2024-10-30 DIAGNOSIS — M47817 Spondylosis without myelopathy or radiculopathy, lumbosacral region: Secondary | ICD-10-CM | POA: Diagnosis not present

## 2024-11-20 ENCOUNTER — Other Ambulatory Visit: Payer: Self-pay

## 2024-11-20 DIAGNOSIS — D473 Essential (hemorrhagic) thrombocythemia: Secondary | ICD-10-CM

## 2024-11-21 ENCOUNTER — Inpatient Hospital Stay: Attending: Hematology

## 2024-11-21 ENCOUNTER — Inpatient Hospital Stay: Admitting: Hematology

## 2024-11-21 DIAGNOSIS — Z7901 Long term (current) use of anticoagulants: Secondary | ICD-10-CM | POA: Diagnosis not present

## 2024-11-21 DIAGNOSIS — I10 Essential (primary) hypertension: Secondary | ICD-10-CM | POA: Insufficient documentation

## 2024-11-21 DIAGNOSIS — I4891 Unspecified atrial fibrillation: Secondary | ICD-10-CM | POA: Insufficient documentation

## 2024-11-21 DIAGNOSIS — D75839 Thrombocytosis, unspecified: Secondary | ICD-10-CM | POA: Insufficient documentation

## 2024-11-21 DIAGNOSIS — M2550 Pain in unspecified joint: Secondary | ICD-10-CM | POA: Insufficient documentation

## 2024-11-21 DIAGNOSIS — M1710 Unilateral primary osteoarthritis, unspecified knee: Secondary | ICD-10-CM | POA: Insufficient documentation

## 2024-11-21 DIAGNOSIS — D473 Essential (hemorrhagic) thrombocythemia: Secondary | ICD-10-CM | POA: Insufficient documentation

## 2024-11-21 DIAGNOSIS — Z888 Allergy status to other drugs, medicaments and biological substances status: Secondary | ICD-10-CM | POA: Insufficient documentation

## 2024-11-21 DIAGNOSIS — Z87891 Personal history of nicotine dependence: Secondary | ICD-10-CM | POA: Insufficient documentation

## 2024-11-21 DIAGNOSIS — Z8249 Family history of ischemic heart disease and other diseases of the circulatory system: Secondary | ICD-10-CM | POA: Diagnosis not present

## 2024-11-21 DIAGNOSIS — C4491 Basal cell carcinoma of skin, unspecified: Secondary | ICD-10-CM | POA: Insufficient documentation

## 2024-11-21 DIAGNOSIS — Z79899 Other long term (current) drug therapy: Secondary | ICD-10-CM | POA: Insufficient documentation

## 2024-11-21 LAB — CBC WITH DIFFERENTIAL (CANCER CENTER ONLY)
Abs Immature Granulocytes: 0.02 K/uL (ref 0.00–0.07)
Basophils Absolute: 0 K/uL (ref 0.0–0.1)
Basophils Relative: 1 %
Eosinophils Absolute: 0.1 K/uL (ref 0.0–0.5)
Eosinophils Relative: 2 %
HCT: 43.6 % (ref 39.0–52.0)
Hemoglobin: 15.8 g/dL (ref 13.0–17.0)
Immature Granulocytes: 0 %
Lymphocytes Relative: 21 %
Lymphs Abs: 1 K/uL (ref 0.7–4.0)
MCH: 38.3 pg — ABNORMAL HIGH (ref 26.0–34.0)
MCHC: 36.2 g/dL — ABNORMAL HIGH (ref 30.0–36.0)
MCV: 105.8 fL — ABNORMAL HIGH (ref 80.0–100.0)
Monocytes Absolute: 0.6 K/uL (ref 0.1–1.0)
Monocytes Relative: 13 %
Neutro Abs: 2.9 K/uL (ref 1.7–7.7)
Neutrophils Relative %: 63 %
Platelet Count: 265 K/uL (ref 150–400)
RBC: 4.12 MIL/uL — ABNORMAL LOW (ref 4.22–5.81)
RDW: 12.8 % (ref 11.5–15.5)
WBC Count: 4.7 K/uL (ref 4.0–10.5)
nRBC: 0 % (ref 0.0–0.2)

## 2024-11-21 LAB — CMP (CANCER CENTER ONLY)
ALT: 23 U/L (ref 0–44)
AST: 37 U/L (ref 15–41)
Albumin: 4.8 g/dL (ref 3.5–5.0)
Alkaline Phosphatase: 65 U/L (ref 38–126)
Anion gap: 9 (ref 5–15)
BUN: 15 mg/dL (ref 8–23)
CO2: 30 mmol/L (ref 22–32)
Calcium: 9.4 mg/dL (ref 8.9–10.3)
Chloride: 103 mmol/L (ref 98–111)
Creatinine: 0.88 mg/dL (ref 0.61–1.24)
GFR, Estimated: >60 mL/min
Glucose, Bld: 103 mg/dL — ABNORMAL HIGH (ref 70–99)
Potassium: 4.9 mmol/L (ref 3.5–5.1)
Sodium: 141 mmol/L (ref 135–145)
Total Bilirubin: 0.9 mg/dL (ref 0.0–1.2)
Total Protein: 7.6 g/dL (ref 6.5–8.1)

## 2024-11-21 MED ORDER — HYDROXYUREA 500 MG PO CAPS: ORAL_CAPSULE | ORAL | 1 refills | Status: AC

## 2024-11-21 NOTE — Progress Notes (Signed)
 " HEMATOLOGY ONCOLOGY PROGRESS NOTE  Date of service: 11/21/2024  Patient Care Team: Jordan Trula SQUIBB, MD as PCP - General (Internal Medicine) Jordan, Jordan M, MD as PCP - Cardiology (Cardiology)  CHIEF COMPLAINT/PURPOSE OF CONSULTATION: Follow-up for continued evaluation and management of JAK2 Essential thrombocytosis   HISTORY OF PRESENTING ILLNESS: Plz see previous note for details of initial presentation.    SUMMARY OF ONCOLOGIC HISTORY: Oncology History   No problem history exists.    INTERVAL HISTORY:  Jordan Vazquez is a 79 y.o. male who is here today for continued evaluation and management of JAK2 Essential thrombocytosis .   he was last seen by me on 07/20/2024; at the time he did not have any concerns and was doing well.   Today, he states that his back and joint pain is persisting. Other than the joint pain, he notes that he is feeling well overall.   Denies infection issues, abdominal pain, and leg swelling.  He is up to date on his vaccinations.   REVIEW OF SYSTEMS:   10 Point review of systems of done and is negative except as noted above.  MEDICAL HISTORY Past Medical History:  Diagnosis Date   Arthritis    arhtiritis in knees , shoulders, elbows    Essential thrombocytosis (HCC) 09/19/2015   Jak2 [id number] mutation positive Essential thrombocytosis -On Hydroxyurea .    Hyperlipidemia    Hypertension    Peripheral neuropathy     SURGICAL HISTORY Past Surgical History:  Procedure Laterality Date   CARDIAC CATHETERIZATION     16 years ago negative    HERNIA REPAIR     right inguinal hernia surgery    I & D KNEE WITH POLY EXCHANGE  12/06/2011   Procedure: IRRIGATION AND DEBRIDEMENT KNEE WITH POLY EXCHANGE;  Surgeon: Jordan JONETTA Car, MD;  Location: [facility];  Service: Orthopedics;  Laterality: Right;  Right Total Knee Irrigation and Debridement with Poly Exchange   INGUINAL HERNIA REPAIR Left 06/06/2024   Procedure: REPAIR, HERNIA, INGUINAL, ADULT;  Surgeon: Jordan Deward MOULD, MD;  Location: [facility];  Service: General;  Laterality: Left;   JOINT REPLACEMENT     left knee 5/12, right TKA 10/12    TOE AMPUTATION Right     SOCIAL HISTORY Social History[1]  Social History   Social History Narrative   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     SOCIAL DRIVERS OF HEALTH SDOH Screenings   Housing: Unknown (04/30/2024)   Received from [facility]  Tobacco Use: Medium Risk (08/22/2024)     FAMILY HISTORY Family History  Problem Relation Age of Onset   Hypertension Mother      ALLERGIES: is allergic to niacin, niacin and related, duloxetine hcl, and simvastatin.  MEDICATIONS  Current Outpatient Medications  Medication Sig Dispense Refill   atorvastatin  (LIPITOR) 40 MG tablet Take 40 mg by mouth every morning.     baclofen  (LIORESAL ) 10 MG tablet Take 10 mg by mouth 2 (two) times daily.  0   Cholecalciferol (VITAMIN D-3) 1000 UNITS CAPS Take 2,000 Units by mouth daily.     ELIQUIS  5 MG TABS tablet Take 1 tablet (5 mg total) by mouth 2 (two) times daily. 60 tablet 11   gabapentin  (NEURONTIN ) 300 MG capsule Take 300 mg by mouth 3 (three) times daily.     hydrochlorothiazide (HYDRODIURIL) 12.5 MG tablet Take 12.5 mg by mouth every morning.     hydroxyurea  (HYDREA ) 500 MG capsule TAKE TWO CAPSULES (1000mg )  BY MOUTH ON Monday, Tuesday, wednesday, Thursday, Friday AND TAKE ONE CAPSULE (500mg ) BY MOUTH ON Saturdays AND Sundays 180 capsule 1   Multiple Vitamin (MULITIVITAMIN WITH MINERALS) TABS Take 2 tablets by mouth daily.      oxyCODONE -acetaminophen  (PERCOCET) 10-325 MG tablet Take 1 tablet by mouth every 4 (four) hours as needed.     No current facility-administered medications for this visit.    PHYSICAL EXAMINATION: ECOG PERFORMANCE STATUS: 1 - Symptomatic but completely ambulatory VITALS: Vitals:   11/21/24 0900 11/21/24 0921  BP: (!) 165/89 (!) 148/71  Pulse: (!) 49   Resp: 18   Temp: 98.1 F (36.7 C)    SpO2: 96%    Filed Weights   11/21/24 0900  Weight: 204 lb (92.5 kg)   Body mass index is 28.86 kg/Vazquez.  GENERAL: alert, in no acute distress and comfortable SKIN: no acute rashes, no significant lesions EYES: conjunctiva are pink and non-injected, sclera anicteric OROPHARYNX: MMM, no exudates, no oropharyngeal erythema or ulceration NECK: supple, no JVD LYMPH:  no palpable lymphadenopathy in the cervical, axillary or inguinal regions LUNGS: clear to auscultation b/l with normal respiratory effort HEART: regular rate & rhythm ABDOMEN:  normoactive bowel sounds , non tender, not distended, no hepatosplenomegaly Extremity: no pedal edema PSYCH: alert & oriented x 3 with fluent speech NEURO: no focal motor/sensory deficits  LABORATORY DATA:   I have reviewed the data as listed     Latest Ref Rng & Units 11/21/2024    7:19 AM 07/18/2024    7:28 AM 03/26/2024    7:15 AM  CBC EXTENDED  WBC 4.0 - 10.5 K/uL 4.7  5.3  4.8   RBC 4.22 - 5.81 MIL/uL 4.12  3.79  3.80   Hemoglobin 13.0 - 17.0 g/dL 84.1  84.9  85.1   HCT 39.0 - 52.0 % 43.6  40.5  40.9   Platelets 150 - 400 K/uL 265  214  258   NEUT# 1.7 - 7.7 K/uL 2.9  3.4  2.8   Lymph# 0.7 - 4.0 K/uL 1.0  1.1  1.1         Latest Ref Rng & Units 11/21/2024    7:19 AM 07/18/2024    7:28 AM 06/01/2024   12:25 PM  CMP  Glucose 70 - 99 mg/dL 896  893  87   BUN 8 - 23 mg/dL 15  17  15    Creatinine 0.61 - 1.24 mg/dL 9.11  9.22  9.12   Sodium 135 - 145 mmol/L 141  140  140   Potassium 3.5 - 5.1 mmol/L 4.9  4.4  4.5   Chloride 98 - 111 mmol/L 103  103  105   CO2 22 - 32 mmol/L 30  31  25    Calcium  8.9 - 10.3 mg/dL 9.4  9.5  9.3   Total Protein 6.5 - 8.1 g/dL 7.6  7.8    Total Bilirubin 0.0 - 1.2 mg/dL 0.9  0.9    Alkaline Phos 38 - 126 U/L 65  67    AST 15 - 41 U/L 37  31    ALT 0 - 44 U/L 23  21       RADIOGRAPHIC STUDIES: I have personally reviewed the radiological images as listed and agreed with the findings in the report. No  results found.  ASSESSMENT & PLAN:  79 y.o. male with  #1  JAK2 positive essential thrombocytosis #2 h/o Abnormal LFTs - AST and ALT .normalized on labs today.  Likely  from fatty liver. -will continue to monitor. #3 basal cell skin cancer follows with dermatology #4 atrial fibrillation on anticoagulation follows with cardiology #5.     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 Will continue current dose of hydrea  Continue ASA F/u with PCP for mx of other chronic medical issues F/u with pain mx clinic for continued mx of back and joint pains Discussed staying up to date with age approach vaccinations and cancer screening with PCP - Labs 1-2 days prior to phone visit  FOLLOW-UP in 4 months for labs and phone visit follow-up with Dr. Onesimo.  The total time spent in the appointment was 20 minutes* .  All of the patient's questions were answered and the patient knows to call the clinic with any problems, questions, or concerns.  Emaline Onesimo MD MS AAHIVMS [facility] Hematology/Oncology Physician [facility]  *Total Encounter Time as defined by the Centers for Medicare and Medicaid Services  includes, in addition to the face-to-face time of a patient visit (documented in the note above) non-face-to-face time: obtaining and reviewing outside history, ordering and reviewing medications, tests or procedures, care coordination (communications with other health care professionals or caregivers) and documentation in the medical record.  I, Marijo Sharps, acting as a neurosurgeon for Emaline Onesimo, MD.,have documented all relevant documentation on the behalf of Emaline Onesimo, MD,as directed by  Emaline Onesimo, MD while in the presence of Emaline Onesimo, MD.  I have reviewed the above documentation for accuracy and completeness, and I agree with the above.  Emaline Onesimo, MD      [1]  Social History Tobacco Use   Smoking status: Former    Current packs/day: 0.00    Average packs/day: 0.3 packs/day    Types: Cigarettes    Quit date: 11/16/1963    Years since quitting: 61.0   Smokeless tobacco: Never  Vaping Use   Vaping status: Never Used  Substance Use Topics   Alcohol use: Yes    Alcohol/week: 2.0 standard drinks of alcohol    Types: 2 Cans of beer per week   Drug use: No   "

## 2025-03-20 ENCOUNTER — Inpatient Hospital Stay

## 2025-03-22 ENCOUNTER — Inpatient Hospital Stay: Admitting: Hematology
